# Patient Record
Sex: Male | Born: 1935 | Race: White | Hispanic: No | Marital: Married | Smoking: Never smoker
Health system: Southern US, Community
[De-identification: ages and names within clinical notes are randomized; demographics above are authoritative.]

## PROBLEM LIST (undated history)

## (undated) DIAGNOSIS — I10 Essential (primary) hypertension: Secondary | ICD-10-CM

## (undated) DIAGNOSIS — R7303 Prediabetes: Secondary | ICD-10-CM

## (undated) DIAGNOSIS — E559 Vitamin D deficiency, unspecified: Secondary | ICD-10-CM

## (undated) DIAGNOSIS — C801 Malignant (primary) neoplasm, unspecified: Secondary | ICD-10-CM

## (undated) DIAGNOSIS — E785 Hyperlipidemia, unspecified: Secondary | ICD-10-CM

## (undated) HISTORY — DX: Prediabetes: R73.03

## (undated) HISTORY — DX: Essential (primary) hypertension: I10

## (undated) HISTORY — DX: Hyperlipidemia, unspecified: E78.5

## (undated) HISTORY — DX: Malignant (primary) neoplasm, unspecified: C80.1

## (undated) HISTORY — DX: Vitamin D deficiency, unspecified: E55.9

---

## 1999-09-04 HISTORY — PX: PROSTATECTOMY: SHX69

## 2000-06-24 ENCOUNTER — Ambulatory Visit (HOSPITAL_COMMUNITY): Admission: RE | Admit: 2000-06-24 | Discharge: 2000-06-24 | Payer: Self-pay | Admitting: Internal Medicine

## 2000-06-24 ENCOUNTER — Encounter: Payer: Self-pay | Admitting: Internal Medicine

## 2000-08-05 ENCOUNTER — Encounter (INDEPENDENT_AMBULATORY_CARE_PROVIDER_SITE_OTHER): Payer: Self-pay | Admitting: Specialist

## 2000-08-05 ENCOUNTER — Other Ambulatory Visit: Admission: RE | Admit: 2000-08-05 | Discharge: 2000-08-05 | Payer: Self-pay | Admitting: Urology

## 2000-08-13 ENCOUNTER — Encounter: Admission: RE | Admit: 2000-08-13 | Discharge: 2000-08-13 | Payer: Self-pay | Admitting: Urology

## 2000-08-13 ENCOUNTER — Encounter: Payer: Self-pay | Admitting: Urology

## 2000-09-09 ENCOUNTER — Encounter (INDEPENDENT_AMBULATORY_CARE_PROVIDER_SITE_OTHER): Payer: Self-pay | Admitting: Specialist

## 2000-09-09 ENCOUNTER — Inpatient Hospital Stay (HOSPITAL_COMMUNITY): Admission: RE | Admit: 2000-09-09 | Discharge: 2000-09-12 | Payer: Self-pay | Admitting: Urology

## 2000-09-11 ENCOUNTER — Encounter: Payer: Self-pay | Admitting: Urology

## 2000-09-30 ENCOUNTER — Emergency Department (HOSPITAL_COMMUNITY): Admission: EM | Admit: 2000-09-30 | Discharge: 2000-09-30 | Payer: Self-pay | Admitting: Emergency Medicine

## 2000-10-11 ENCOUNTER — Emergency Department (HOSPITAL_COMMUNITY): Admission: EM | Admit: 2000-10-11 | Discharge: 2000-10-12 | Payer: Self-pay | Admitting: Emergency Medicine

## 2000-10-19 ENCOUNTER — Emergency Department (HOSPITAL_COMMUNITY): Admission: EM | Admit: 2000-10-19 | Discharge: 2000-10-19 | Payer: Self-pay | Admitting: Emergency Medicine

## 2003-09-22 ENCOUNTER — Ambulatory Visit (HOSPITAL_COMMUNITY): Admission: RE | Admit: 2003-09-22 | Discharge: 2003-09-22 | Payer: Self-pay | Admitting: Internal Medicine

## 2005-12-02 LAB — HM COLONOSCOPY

## 2005-12-17 ENCOUNTER — Ambulatory Visit: Payer: Self-pay | Admitting: Gastroenterology

## 2006-01-08 ENCOUNTER — Ambulatory Visit: Payer: Self-pay | Admitting: Gastroenterology

## 2007-04-04 ENCOUNTER — Ambulatory Visit (HOSPITAL_COMMUNITY): Admission: RE | Admit: 2007-04-04 | Discharge: 2007-04-04 | Payer: Self-pay | Admitting: Internal Medicine

## 2011-01-19 NOTE — Op Note (Signed)
[name]  Patient:    Timothy Vaughn, Timothy Vaughn                    MRN: [id number] Proc. Date: 09/09/00 Adm. Date:  [id number] Attending:  Laqueta Jean CC:         Marinus Maw, M.D.   Operative Report  PREOPERATIVE DIAGNOSES:  Adenocarcinoma of the prostate and right inguinal hernia.  POSTOPERATIVE DIAGNOSES:  Adenocarcinoma of the prostate and right inguinal hernia.  OPERATION PERFORMED:  Radical retropubic prostatectomy. The patient also underwent right inguinal hernia repair by Dr. Orson Slick with first assistant by Dr. Patsi Sears.  FIRST ASSISTANT:  Dr. Isabel Caprice.  PREPARATION:  After appropriate preanesthesia, the patient was brought to the operating room and placed on the operating table in dorsal supine position where general endotracheal anesthesia was introduced. TED hose were applied, a Foley catheter was placed after prepping and draping in the usual fashion. After drainage of the bladder with the patient prepped and draped, a roll was placed under the low back, and the table placed in slight suction position. Upon this, draping was accomplished in a standard fashion, using a Betadine impregnated Vidrape, to cover the right inguinal area for future hernia surgery.  A 20 cm midline incision was made with subcutaneous tissue dissected with the electrosurgical unit. The midline was incised with blunt and sharp dissection. Both right and left pelvic gutters were dissected. The self retaining retractor were placed and right and left pelvic lymph node dissection was accomplished, using the external iliac vein, as a guide. The tissue was dissected to the lateral side wall and deep to the obturator nerve, and distalwards to the inguinal canal. The tissue was sent for permanent section analysis on either side. A 4 x 4 sponge was then placed in the wound and was removed prior to the end of the case. The patient then underwent nerve  sparing radical retropubic prostatectomy, by incising the retropubic space bilaterally with sharp dissection, up to the level of the puboprostatic. After both sides were dissected, both puboprostatics were sharply cut. A bladder neck suture was then placed, with #0 Vicryl suture.  Using a Hoenfeldner clamp, two #1 Vicryl sutures were placed above the urethra, to ligate the venous ______. A separate #0 Vicryl suture ligature was doubly placed distal to these to afford better suture ligature. Upon this, the Hoenfeldner was again placed, and the venous plexus was incised. The urethra was then incised. Following complete urethral incision, the retropubic space was dissected, with a good plane above the rectum. Sharp dissection and blunt dissection was accomplished, but no electrocautery was used because ______ type of prostatectomy. The silk ligature was then used to ligate the vascular pedicle to the prostate on the right and left side. Following this, the bladder neck was dissected, and care was taken to keep as much as bladder neck as possible. Once the bladder neck was incised, it was decided that it was the correct size, without needing either closure or widening. This was everted with 4-0 Vicryl suture. The seminal vesicles were then dissected. The vas was identified and clipped with a ligaclip bilaterally. Additional portion of the left seminal vesicle was removed with the ligaclip. Following this, minimal bleeding was noted. The wound was irrigated and packed. Following external mucosal suturing of the bladder neck, five separate sutures were then independently placed at the level of the bladder neck using a size 24 dilator in the urethra.  Each of these sutures  was then attached to the bladder neck in standard fashion. Following this, water tight anastomosis was accomplished, with irrigation of bladder revealing no evidence of leakage. A left pelvic drain was placed, sutured in place  with 3-0 nylon suture.  Dr. Marcy Panning then accomplished right inguinal hernia repair. Following this repair, the wound was closed in a running fashion with #1 PDS suture. The skin was closed with a skin stapler. The patient was awakened, and taken to the recovery room in excellent condition. DD:  09/09/00 TD:  09/09/00 Job: [id number]

## 2011-01-19 NOTE — H&P (Signed)
[facility]  Patient:    Timothy Vaughn, Timothy Vaughn                    MRN: [id number] Adm. Date:  [id number] Attending:  Laqueta Jean CC:         Marinus Maw, M.D.   History and Physical  HISTORY OF PRESENT ILLNESS:  The patient is a 77 year old, married, white male, who was found to an elevated PSA with prostate biopsy showing Gleason 6 adenocarcinoma of the prostate.  He has an international prostate score of 7/7.  He has selected radical prostatectomy as the treatment of choice, nerve-sparing type.  PAST MEDICAL HISTORY:  Significant for:  1. A right inguinal hernia to be repaired at the same time.  2. Polio as a child.  (The patient is able to play tennis and golf.)  3. Thoracolumbar scoliosis (left posterior chest lump). 4. Possible LVH by EKG in the office.  PAST SURGICAL HISTORY:  Noncontributory.  TOBACCO AND ALCOHOL:  Noncontributory.  FAMILY HISTORY:  Noncontributory.  ADMISSION PHYSICAL EXAMINATION:  A well-developed, well-nourished, white male in no acute distress.  VITAL SIGNS:  Temperature 97.8 degrees, heart rate 68, respiratory rate 16, blood pressure 110/60.  HEENT:  PERRL.  EOM full.  NECK:  Supple, nontender.  No nodes.  CHEST:  Clear to P&A, but somewhat restricted to a history of polio.  EXTREMITIES:  The patient has a smaller left lower extremity than right lower extremity.  RECTAL:  Normal sphincter tone.  Prostate 3+, lobular, and benign.  No masses. No blood.  EXTREMITIES:  Without cyanosis or edema.  NEUROLOGIC:  Physiologic.  ABDOMEN:  Soft.  Positive bowel sounds.  Without organomegaly and without masses, but there is a right prominent inguinal hernia.  It is marked preoperatively.  ADMISSION DIAGNOSES: 1. Prostate cancer. 2. Right inguinal hernia for repair today. DD:  09/09/00 TD:  09/09/00 Job: [id number]

## 2011-01-19 NOTE — Consult Note (Signed)
[facility]  Patient:    Timothy Vaughn, Timothy Vaughn                    MRN: [id number] Adm. Date:  [id number] Disc. Date: [id number] Attending:  Lorre Nick CC:         Sigmund I. Patsi Sears, M.D.   Consultation Report  HISTORY OF PRESENT ILLNESS:  Timothy Vaughn is status post radical prostatectomy on September 09, 2000.  After catheter removal, he has had four episodes of postoperative retention requiring catheter replacement.  His catheter was most recently removed on Wednesday of this week.  He was on Flomax and Urecholine to aid voiding.  He did well until about 5 p.m. on February 8, when he developed retention.  He came to the emergency room where attempt was made by EMT and nurse to place a catheter without success, but apparently with some traumas.  There is penile bleeding.  I then placed a catheter with some difficulty using a catheter guide and a 16 French Foley after the routine prep and lubricating the urethra with lidocaine jelly.  The catheter irrigated easily after placement.  The balloon filled without resistance.  The catheter was placed to straight drainage.  The patient was sent home with a Vicodin prepack and he has Bactrim DS.  He was encouraged to remain on his Flomax, but will hold his Urecholine for now.  IMPRESSION:  Post radical prostatectomy with retention.  PROCEDURE:  Complicated catheterization.  PLAN:  Follow up with Dr. Patsi Sears as planned on Tuesday. DD:  10/12/00 TD:  10/13/00 Job: [id number]

## 2011-01-19 NOTE — Discharge Summary (Signed)
[facility]  Patient:    Timothy Vaughn, Timothy Vaughn                    MRN: [id number] Adm. Date:  [id number] Disc. Date: [id number] Attending:  Laqueta Vaughn CC:         Timothy Vaughn, M.D.   Discharge Summary  FINAL DIAGNOSES:  Adenocarcinoma of the prostate.  OPERATION PERFORMED:  Radical retropubic prostatectomy, September 09, 2000.  HISTORY OF PRESENT ILLNESS:  [name] is a 77 year old married white male, found to have elevated PSA with prostate biopsy showing Gleason 6 adenocarcinoma of the prostate. He an International Prostate Symptom Score sheet of 7/7, and selected radical prostatectomy  as the treatment of choice, with nerve sparing type if possible.  PAST MEDICAL HISTORY: 1. Right inguinal hernia to be repaired simultaneously. 2. Childhood polio (patient is able to play golf and tennis). 3. Thoracolumbar scoliosis (left posterior chest lump). 4. LVH by EKG.  ALCOHOL:  None.  TOBACCO:  None.  FAMILY HISTORY:  Noncontributory.  ADMISSION PHYSICAL EXAMINATION:  GENERAL:  Well-developed, well-nourished, white male in no acute distress.  VITAL SIGNS:  Blood pressure 110/60, temperature 97.8, heart rate 68, respiratory rate 16.  The remaining physical examination is as noted in the H&P of September 09, 2000.  ADMISSION LABORATORY DATA:  Shows a hemoglobin of 14.9, hematocrit of 41.7, BUN 20, creatinine 0.9.  Admission chest x-ray shows severe scoliosis with deformity of the thoracic cage.  HOSPITAL COURSE:  On the day of admission, the patient underwent radical retropubic prostatectomy. Postoperatively the patient did well and had minimum Jackson-Pratt drainage, which was removed on the day of discharge. Pathologic evaluation shows a stage [id number], with Gleason score 3+3 (6), with focal margin involvement, in left lateral prostatic capsule. There was no extracapsular extension of carcinoma noted. The entire prostate was  involved by an estimated 20% tumor.  The patient did well during this hospitalization, but developed a cough with no evidence of pneumonia. He has been treated several times in the past by Dr. Oneta Rack with Z pack and steroid dosepak, and I will allow him to be discharged on this medication. He will return to the office in one week for staple removal, and in 2-3 weeks for catheter removal. He is discharged in stable condition. DD:  09/12/00 TD:  09/12/00 Job: [id number]

## 2011-02-10 ENCOUNTER — Encounter: Payer: Self-pay | Admitting: Gastroenterology

## 2011-09-11 DIAGNOSIS — L578 Other skin changes due to chronic exposure to nonionizing radiation: Secondary | ICD-10-CM | POA: Diagnosis not present

## 2011-09-11 DIAGNOSIS — L570 Actinic keratosis: Secondary | ICD-10-CM | POA: Diagnosis not present

## 2011-11-01 DIAGNOSIS — E782 Mixed hyperlipidemia: Secondary | ICD-10-CM | POA: Diagnosis not present

## 2011-11-01 DIAGNOSIS — I10 Essential (primary) hypertension: Secondary | ICD-10-CM | POA: Diagnosis not present

## 2011-11-14 DIAGNOSIS — J209 Acute bronchitis, unspecified: Secondary | ICD-10-CM | POA: Diagnosis not present

## 2011-12-31 DIAGNOSIS — R0602 Shortness of breath: Secondary | ICD-10-CM | POA: Diagnosis not present

## 2011-12-31 DIAGNOSIS — R609 Edema, unspecified: Secondary | ICD-10-CM | POA: Diagnosis not present

## 2011-12-31 DIAGNOSIS — R7309 Other abnormal glucose: Secondary | ICD-10-CM | POA: Diagnosis not present

## 2011-12-31 DIAGNOSIS — Z79899 Other long term (current) drug therapy: Secondary | ICD-10-CM | POA: Diagnosis not present

## 2012-01-01 ENCOUNTER — Other Ambulatory Visit (HOSPITAL_COMMUNITY): Payer: Self-pay | Admitting: Internal Medicine

## 2012-01-01 ENCOUNTER — Ambulatory Visit (HOSPITAL_COMMUNITY)
Admission: RE | Admit: 2012-01-01 | Discharge: 2012-01-01 | Disposition: A | Discharge status: Home / Self Care | Payer: Medicare Other | Source: Ambulatory Visit | Attending: Internal Medicine | Admitting: Internal Medicine

## 2012-01-01 DIAGNOSIS — R0609 Other forms of dyspnea: Secondary | ICD-10-CM | POA: Diagnosis not present

## 2012-01-01 DIAGNOSIS — R609 Edema, unspecified: Secondary | ICD-10-CM | POA: Insufficient documentation

## 2012-01-01 DIAGNOSIS — M7989 Other specified soft tissue disorders: Secondary | ICD-10-CM | POA: Diagnosis not present

## 2012-01-01 DIAGNOSIS — R0600 Dyspnea, unspecified: Secondary | ICD-10-CM

## 2012-01-01 DIAGNOSIS — R0989 Other specified symptoms and signs involving the circulatory and respiratory systems: Secondary | ICD-10-CM | POA: Insufficient documentation

## 2012-01-09 DIAGNOSIS — R609 Edema, unspecified: Secondary | ICD-10-CM | POA: Diagnosis not present

## 2012-01-09 DIAGNOSIS — I10 Essential (primary) hypertension: Secondary | ICD-10-CM | POA: Diagnosis not present

## 2012-01-14 DIAGNOSIS — D1801 Hemangioma of skin and subcutaneous tissue: Secondary | ICD-10-CM | POA: Diagnosis not present

## 2012-01-14 DIAGNOSIS — L570 Actinic keratosis: Secondary | ICD-10-CM | POA: Diagnosis not present

## 2012-01-14 DIAGNOSIS — L821 Other seborrheic keratosis: Secondary | ICD-10-CM | POA: Diagnosis not present

## 2012-01-14 DIAGNOSIS — L578 Other skin changes due to chronic exposure to nonionizing radiation: Secondary | ICD-10-CM | POA: Diagnosis not present

## 2012-02-05 DIAGNOSIS — C61 Malignant neoplasm of prostate: Secondary | ICD-10-CM | POA: Diagnosis not present

## 2012-02-05 DIAGNOSIS — E782 Mixed hyperlipidemia: Secondary | ICD-10-CM | POA: Diagnosis not present

## 2012-02-05 DIAGNOSIS — Z79899 Other long term (current) drug therapy: Secondary | ICD-10-CM | POA: Diagnosis not present

## 2012-02-05 DIAGNOSIS — R7309 Other abnormal glucose: Secondary | ICD-10-CM | POA: Diagnosis not present

## 2012-02-05 DIAGNOSIS — Z23 Encounter for immunization: Secondary | ICD-10-CM | POA: Diagnosis not present

## 2012-02-05 DIAGNOSIS — I10 Essential (primary) hypertension: Secondary | ICD-10-CM | POA: Diagnosis not present

## 2012-02-05 DIAGNOSIS — E559 Vitamin D deficiency, unspecified: Secondary | ICD-10-CM | POA: Diagnosis not present

## 2012-02-05 DIAGNOSIS — Z1212 Encounter for screening for malignant neoplasm of rectum: Secondary | ICD-10-CM | POA: Diagnosis not present

## 2012-02-05 DIAGNOSIS — Z111 Encounter for screening for respiratory tuberculosis: Secondary | ICD-10-CM | POA: Diagnosis not present

## 2012-02-13 DIAGNOSIS — L02419 Cutaneous abscess of limb, unspecified: Secondary | ICD-10-CM | POA: Diagnosis not present

## 2012-05-20 DIAGNOSIS — E559 Vitamin D deficiency, unspecified: Secondary | ICD-10-CM | POA: Diagnosis not present

## 2012-05-20 DIAGNOSIS — I10 Essential (primary) hypertension: Secondary | ICD-10-CM | POA: Diagnosis not present

## 2012-05-20 DIAGNOSIS — Z79899 Other long term (current) drug therapy: Secondary | ICD-10-CM | POA: Diagnosis not present

## 2012-05-20 DIAGNOSIS — E782 Mixed hyperlipidemia: Secondary | ICD-10-CM | POA: Diagnosis not present

## 2012-05-20 DIAGNOSIS — R7309 Other abnormal glucose: Secondary | ICD-10-CM | POA: Diagnosis not present

## 2012-05-22 ENCOUNTER — Encounter: Payer: Self-pay | Admitting: Gastroenterology

## 2012-07-04 DIAGNOSIS — L819 Disorder of pigmentation, unspecified: Secondary | ICD-10-CM | POA: Diagnosis not present

## 2012-07-04 DIAGNOSIS — D485 Neoplasm of uncertain behavior of skin: Secondary | ICD-10-CM | POA: Diagnosis not present

## 2012-07-04 DIAGNOSIS — C44611 Basal cell carcinoma of skin of unspecified upper limb, including shoulder: Secondary | ICD-10-CM | POA: Diagnosis not present

## 2012-07-04 DIAGNOSIS — L578 Other skin changes due to chronic exposure to nonionizing radiation: Secondary | ICD-10-CM | POA: Diagnosis not present

## 2012-07-04 DIAGNOSIS — L821 Other seborrheic keratosis: Secondary | ICD-10-CM | POA: Diagnosis not present

## 2012-07-04 DIAGNOSIS — C44519 Basal cell carcinoma of skin of other part of trunk: Secondary | ICD-10-CM | POA: Diagnosis not present

## 2012-07-04 DIAGNOSIS — D1801 Hemangioma of skin and subcutaneous tissue: Secondary | ICD-10-CM | POA: Diagnosis not present

## 2012-07-04 DIAGNOSIS — L570 Actinic keratosis: Secondary | ICD-10-CM | POA: Diagnosis not present

## 2012-08-14 DIAGNOSIS — C4491 Basal cell carcinoma of skin, unspecified: Secondary | ICD-10-CM | POA: Diagnosis not present

## 2012-08-20 DIAGNOSIS — E559 Vitamin D deficiency, unspecified: Secondary | ICD-10-CM | POA: Diagnosis not present

## 2012-08-20 DIAGNOSIS — I10 Essential (primary) hypertension: Secondary | ICD-10-CM | POA: Diagnosis not present

## 2012-08-20 DIAGNOSIS — Z79899 Other long term (current) drug therapy: Secondary | ICD-10-CM | POA: Diagnosis not present

## 2012-08-20 DIAGNOSIS — E782 Mixed hyperlipidemia: Secondary | ICD-10-CM | POA: Diagnosis not present

## 2012-08-20 DIAGNOSIS — R7309 Other abnormal glucose: Secondary | ICD-10-CM | POA: Diagnosis not present

## 2012-11-03 DIAGNOSIS — L821 Other seborrheic keratosis: Secondary | ICD-10-CM | POA: Diagnosis not present

## 2012-11-03 DIAGNOSIS — C4441 Basal cell carcinoma of skin of scalp and neck: Secondary | ICD-10-CM | POA: Diagnosis not present

## 2012-11-03 DIAGNOSIS — Z85828 Personal history of other malignant neoplasm of skin: Secondary | ICD-10-CM | POA: Diagnosis not present

## 2012-11-03 DIAGNOSIS — L570 Actinic keratosis: Secondary | ICD-10-CM | POA: Diagnosis not present

## 2012-11-03 DIAGNOSIS — D485 Neoplasm of uncertain behavior of skin: Secondary | ICD-10-CM | POA: Diagnosis not present

## 2012-11-03 DIAGNOSIS — L578 Other skin changes due to chronic exposure to nonionizing radiation: Secondary | ICD-10-CM | POA: Diagnosis not present

## 2012-11-03 DIAGNOSIS — D1801 Hemangioma of skin and subcutaneous tissue: Secondary | ICD-10-CM | POA: Diagnosis not present

## 2012-11-12 DIAGNOSIS — D485 Neoplasm of uncertain behavior of skin: Secondary | ICD-10-CM | POA: Diagnosis not present

## 2012-11-12 DIAGNOSIS — C4441 Basal cell carcinoma of skin of scalp and neck: Secondary | ICD-10-CM | POA: Diagnosis not present

## 2012-11-12 DIAGNOSIS — Z85828 Personal history of other malignant neoplasm of skin: Secondary | ICD-10-CM | POA: Diagnosis not present

## 2012-11-12 DIAGNOSIS — L578 Other skin changes due to chronic exposure to nonionizing radiation: Secondary | ICD-10-CM | POA: Diagnosis not present

## 2012-11-12 DIAGNOSIS — D1801 Hemangioma of skin and subcutaneous tissue: Secondary | ICD-10-CM | POA: Diagnosis not present

## 2012-11-12 DIAGNOSIS — L570 Actinic keratosis: Secondary | ICD-10-CM | POA: Diagnosis not present

## 2012-11-12 DIAGNOSIS — L821 Other seborrheic keratosis: Secondary | ICD-10-CM | POA: Diagnosis not present

## 2012-11-21 DIAGNOSIS — Z79899 Other long term (current) drug therapy: Secondary | ICD-10-CM | POA: Diagnosis not present

## 2012-11-21 DIAGNOSIS — I10 Essential (primary) hypertension: Secondary | ICD-10-CM | POA: Diagnosis not present

## 2012-11-21 DIAGNOSIS — E782 Mixed hyperlipidemia: Secondary | ICD-10-CM | POA: Diagnosis not present

## 2012-11-21 DIAGNOSIS — E559 Vitamin D deficiency, unspecified: Secondary | ICD-10-CM | POA: Diagnosis not present

## 2012-11-21 DIAGNOSIS — R7309 Other abnormal glucose: Secondary | ICD-10-CM | POA: Diagnosis not present

## 2012-12-02 DIAGNOSIS — C4491 Basal cell carcinoma of skin, unspecified: Secondary | ICD-10-CM | POA: Diagnosis not present

## 2013-02-04 DIAGNOSIS — I10 Essential (primary) hypertension: Secondary | ICD-10-CM | POA: Diagnosis not present

## 2013-02-04 DIAGNOSIS — E782 Mixed hyperlipidemia: Secondary | ICD-10-CM | POA: Diagnosis not present

## 2013-02-04 DIAGNOSIS — Z1212 Encounter for screening for malignant neoplasm of rectum: Secondary | ICD-10-CM | POA: Diagnosis not present

## 2013-02-04 DIAGNOSIS — C61 Malignant neoplasm of prostate: Secondary | ICD-10-CM | POA: Diagnosis not present

## 2013-02-04 DIAGNOSIS — Z79899 Other long term (current) drug therapy: Secondary | ICD-10-CM | POA: Diagnosis not present

## 2013-02-04 DIAGNOSIS — R7309 Other abnormal glucose: Secondary | ICD-10-CM | POA: Diagnosis not present

## 2013-02-23 DIAGNOSIS — L578 Other skin changes due to chronic exposure to nonionizing radiation: Secondary | ICD-10-CM | POA: Diagnosis not present

## 2013-02-23 DIAGNOSIS — L820 Inflamed seborrheic keratosis: Secondary | ICD-10-CM | POA: Diagnosis not present

## 2013-02-23 DIAGNOSIS — L570 Actinic keratosis: Secondary | ICD-10-CM | POA: Diagnosis not present

## 2013-02-23 DIAGNOSIS — Z85828 Personal history of other malignant neoplasm of skin: Secondary | ICD-10-CM | POA: Diagnosis not present

## 2013-05-20 ENCOUNTER — Other Ambulatory Visit (HOSPITAL_COMMUNITY): Payer: Self-pay | Admitting: Internal Medicine

## 2013-05-20 ENCOUNTER — Ambulatory Visit (HOSPITAL_COMMUNITY)
Admission: RE | Admit: 2013-05-20 | Discharge: 2013-05-20 | Disposition: A | Discharge status: Home / Self Care | Payer: Medicare Other | Source: Ambulatory Visit | Attending: Internal Medicine | Admitting: Internal Medicine

## 2013-05-20 DIAGNOSIS — J984 Other disorders of lung: Secondary | ICD-10-CM | POA: Diagnosis not present

## 2013-05-20 DIAGNOSIS — Z79899 Other long term (current) drug therapy: Secondary | ICD-10-CM | POA: Diagnosis not present

## 2013-05-20 DIAGNOSIS — E559 Vitamin D deficiency, unspecified: Secondary | ICD-10-CM | POA: Diagnosis not present

## 2013-05-20 DIAGNOSIS — R062 Wheezing: Secondary | ICD-10-CM

## 2013-05-20 DIAGNOSIS — E782 Mixed hyperlipidemia: Secondary | ICD-10-CM | POA: Diagnosis not present

## 2013-05-20 DIAGNOSIS — M4120 Other idiopathic scoliosis, site unspecified: Secondary | ICD-10-CM | POA: Insufficient documentation

## 2013-05-20 DIAGNOSIS — I10 Essential (primary) hypertension: Secondary | ICD-10-CM

## 2013-05-20 DIAGNOSIS — R7309 Other abnormal glucose: Secondary | ICD-10-CM | POA: Diagnosis not present

## 2013-05-20 DIAGNOSIS — Z23 Encounter for immunization: Secondary | ICD-10-CM | POA: Diagnosis not present

## 2013-05-20 DIAGNOSIS — J189 Pneumonia, unspecified organism: Secondary | ICD-10-CM

## 2013-08-10 DIAGNOSIS — L821 Other seborrheic keratosis: Secondary | ICD-10-CM | POA: Diagnosis not present

## 2013-08-10 DIAGNOSIS — L570 Actinic keratosis: Secondary | ICD-10-CM | POA: Diagnosis not present

## 2013-08-10 DIAGNOSIS — C4441 Basal cell carcinoma of skin of scalp and neck: Secondary | ICD-10-CM | POA: Diagnosis not present

## 2013-08-10 DIAGNOSIS — Z85828 Personal history of other malignant neoplasm of skin: Secondary | ICD-10-CM | POA: Diagnosis not present

## 2013-08-10 DIAGNOSIS — C44211 Basal cell carcinoma of skin of unspecified ear and external auricular canal: Secondary | ICD-10-CM | POA: Diagnosis not present

## 2013-08-12 ENCOUNTER — Encounter: Payer: Self-pay | Admitting: Internal Medicine

## 2013-08-14 ENCOUNTER — Ambulatory Visit (INDEPENDENT_AMBULATORY_CARE_PROVIDER_SITE_OTHER): Payer: Medicare Other | Admitting: Internal Medicine

## 2013-08-14 ENCOUNTER — Encounter: Payer: Self-pay | Admitting: Internal Medicine

## 2013-08-14 VITALS — BP 130/68 | HR 56 | Temp 98.4°F | Resp 16 | Wt 148.2 lb

## 2013-08-14 DIAGNOSIS — J984 Other disorders of lung: Secondary | ICD-10-CM

## 2013-08-14 DIAGNOSIS — Z125 Encounter for screening for malignant neoplasm of prostate: Secondary | ICD-10-CM

## 2013-08-14 DIAGNOSIS — R7309 Other abnormal glucose: Secondary | ICD-10-CM

## 2013-08-14 DIAGNOSIS — Z79899 Other long term (current) drug therapy: Secondary | ICD-10-CM

## 2013-08-14 DIAGNOSIS — E782 Mixed hyperlipidemia: Secondary | ICD-10-CM | POA: Diagnosis not present

## 2013-08-14 DIAGNOSIS — M4120 Other idiopathic scoliosis, site unspecified: Secondary | ICD-10-CM

## 2013-08-14 DIAGNOSIS — I10 Essential (primary) hypertension: Secondary | ICD-10-CM | POA: Diagnosis not present

## 2013-08-14 DIAGNOSIS — E559 Vitamin D deficiency, unspecified: Secondary | ICD-10-CM

## 2013-08-14 DIAGNOSIS — Z1212 Encounter for screening for malignant neoplasm of rectum: Secondary | ICD-10-CM

## 2013-08-14 LAB — HEMOGLOBIN A1C
Hgb A1c MFr Bld: 5.8 % — ABNORMAL HIGH (ref <5.7)
Mean Plasma Glucose: 120 mg/dL — ABNORMAL HIGH (ref <117)

## 2013-08-14 LAB — CBC WITH DIFFERENTIAL/PLATELET
Basophils Absolute: 0 10*3/uL (ref 0.0–0.1)
Eosinophils Absolute: 0.1 10*3/uL (ref 0.0–0.7)
Eosinophils Relative: 1 % (ref 0–5)
HCT: 45.5 % (ref 39.0–52.0)
Hemoglobin: 15.6 g/dL (ref 13.0–17.0)
Lymphocytes Relative: 28 % (ref 12–46)
MCV: 96.4 fL (ref 78.0–100.0)
Monocytes Absolute: 0.6 10*3/uL (ref 0.1–1.0)
Monocytes Relative: 10 % (ref 3–12)
Neutrophils Relative %: 61 % (ref 43–77)
RDW: 13.3 % (ref 11.5–15.5)
WBC: 6.1 10*3/uL (ref 4.0–10.5)

## 2013-08-14 LAB — LIPID PANEL
HDL: 58 mg/dL (ref >39)
LDL Cholesterol: 109 mg/dL — ABNORMAL HIGH (ref 0–99)

## 2013-08-14 LAB — BASIC METABOLIC PANEL WITH GFR
BUN: 26 mg/dL — ABNORMAL HIGH (ref 6–23)
CO2: 35 mEq/L — ABNORMAL HIGH (ref 19–32)
Chloride: 99 mEq/L (ref 96–112)
Creat: 0.9 mg/dL (ref 0.50–1.35)
GFR, Est Non African American: 80 mL/min
Glucose, Bld: 79 mg/dL (ref 70–99)

## 2013-08-14 LAB — TSH: TSH: 0.887 u[IU]/mL (ref 0.350–4.500)

## 2013-08-14 LAB — HEPATIC FUNCTION PANEL
ALT: 22 U/L (ref 0–53)
AST: 24 U/L (ref 0–37)
Albumin: 4.4 g/dL (ref 3.5–5.2)
Alkaline Phosphatase: 53 U/L (ref 39–117)
Indirect Bilirubin: 0.6 mg/dL (ref 0.0–0.9)
Total Protein: 6.7 g/dL (ref 6.0–8.3)

## 2013-08-14 NOTE — Progress Notes (Signed)
Patient ID: Timothy Vaughn, male   DOB: Jul 06, 1929, 79 y.o.   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    BP has been controlled at home. Today's BP is 130/68. Patient denies any cardiac type chest pain, palpitations, dyspnea/orthopnea/PND, dizziness, claudication, or dependent edema.   Hyperlipidemia is controlled with diet & meds. Last Cholesterol was 165, Triglycerides were 115, HDL 53, and LDL 89. Patient denies myalgias or other med SE's.    Regarding patient's rfestrictive lung disease , he has done fairly weel over the las year and using his HHN/Duoneb very sporatically when he feels he is becoming more congested. This last year he has done very well with no recent respiratory infections.   Also, the patient has history of PreDiabetes with last A1c of 6.0% in September. Patient denies any symptoms of reactive hypoglycemia, diabetic polys, paresthesias or visual blurring.   Further, Patient has history of Vitamin D Deficiency with last vitamin D of 92 in September. Patient supplements vitamin D without any suspected side-effects.  Current Outpatient Prescriptions on File Prior to Visit  Medication Sig Dispense Refill  . Ascorbic Acid (VITAMIN C) 100 MG tablet Take 100 mg by mouth daily.      Marland Kitchen aspirin 81 MG tablet Take 81 mg by mouth daily.      . furosemide (LASIX) 40 MG tablet Take 40 mg by mouth.      . Multiple Vitamin (MULTI VITAMIN DAILY PO) Take by mouth.      . Omega-3 Fatty Acids (FISH OIL PO) Take by mouth.      . pravastatin (PRAVACHOL) 40 MG tablet Take 40 mg by mouth daily.          Allergies  Allergen Reactions  . Lipitor [Atorvastatin] Other (See Comments)    Severe NIV  . Niaspan [Niacin Er] Itching    PMHx:   Past Medical History  Diagnosis Date  . Hypertension   . Hyperlipidemia   .  Pre-diabetes   . Vitamin D deficiency   . Cancer     prostate    FHx:    Reviewed / unchanged  SHx:    Reviewed / unchanged  Systems Review: Constitutional: Denies fever, chills, wt changes, headaches, insomnia, fatigue, night sweats, change in appetite. Eyes: Denies redness, blurred vision, diplopia, discharge, itchy, watery eyes.  ENT: Denies discharge, congestion, post nasal drip, epistaxis, sore throat, earache, hearing loss, dental pain, tinnitus, vertigo, sinus pain, snoring.  CV: Denies chest pain, palpitations, irregular heartbeat, syncope, dyspnea, diaphoresis, orthopnea, PND, claudication, edema. Respiratory: denies cough, dyspnea, DOE, pleurisy, hoarseness, laryngitis, wheezing.  Gastrointestinal: Denies dysphagia, odynophagia, heartburn, reflux, water brash, abdominal pain or cramps, nausea, vomiting, bloating, diarrhea, constipation, hematemesis, melena, hematochezia,  or hemorrhoids. Genitourinary: Denies dysuria, frequency, urgency, nocturia, hesitancy, discharge, hematuria, flank pain. Musculoskeletal: Denies arthralgias, myalgias, stiffness, jt. swelling, pain, limp, strain/sprain.  Skin: Denies pruritus, rash, hives, warts, acne, eczema, change in skin lesion(s). Neuro: No weakness, tremor, incoordination, spasms, paresthesia, or pain. Psychiatric: Denies confusion, memory loss, or sensory loss. Endo: Denies change in weight, skin, hair change.  Heme/Lymph: No excessive bleeding, bruising, orenlarged lymph nodes.  Filed Vitals:   08/14/13 1039  BP: 130/68  Pulse: 56  Temp: 98.4 F (36.9 C)  Resp: 16    On Exam:  Appears well nourished - in no distress. Eyes: PERRLA, EOMs, conjunctiva  no swelling or erythema. Sinuses: No frontal/maxillary tenderness ENT/Mouth: EAC's clear, TM's nl w/o erythema, bulging. Nares clear w/o erythema, swelling, exudates. Oropharynx clear without erythema or exudates. Oral hygiene is good. Tongue normal, non obstructing. Hearing  intact.  Neck: Supple. Thyroid nl. Car 2+/2+ without bruits, nodes or JVD. Chest: Severe chest deformity with roto-kyphoscoliosis. BS very distant and clear w/o rales, rhonchi, wheezing or stridor.  Cor: Heart sounds normal w/ regular rate and rhythm without sig. murmurs, gallops, clicks, or rubs. Peripheral pulses normal and equal  without edema.  Abdomen: Soft & bowel sounds normal. Non-tender w/o guarding, rebound, hernias, masses, or organomegaly.  Lymphatics: Unremarkable.  Musculoskeletal: Full ROM all peripheral extremities, joint stability, 5/5 strength, and normal gait.  Skin: Warm, dry without exposed rashes, lesions, ecchymosis apparent.  Neuro: Cranial nerves intact, reflexes equal bilaterally. Sensory-motor testing grossly intact. Tendon reflexes grossly intact.  Pysch: Alert & oriented x 3. Insight and judgement nl & appropriate. No ideations.  Assessment and Plan:  1. Hypertension - Continue monitor blood pressure at home. Continue diet/meds same.  2. Hyperlipidemia - Continue diet/meds, exercise,& lifestyle modifications. Continue monitor periodic cholesterol/liver & renal functions   3. Pre-diabetes - Continue diet, exercise, lifestyle modifications. Monitor appropriate labs.  4. Vitamin D Deficiency - Continue supplementation  5. Restrictive Lung Disease  Recommended regular exercise, BP monitoring, weight control, and discussed med and SE's. Recommended labs to assess and monitor clinical status. Further disposition pending results of labs.

## 2013-08-14 NOTE — Patient Instructions (Signed)

## 2013-08-15 LAB — INSULIN, FASTING: Insulin fasting, serum: 11 u[IU]/mL (ref 3–28)

## 2013-08-17 ENCOUNTER — Telehealth: Payer: Self-pay

## 2013-08-17 NOTE — Telephone Encounter (Signed)
No answer no machine

## 2013-08-17 NOTE — Telephone Encounter (Signed)
Message copied by Joya Martyr on Mon Aug 17, 2013 10:30 AM ------      Message from: Lucky Cowboy      Created: Sun Aug 16, 2013 10:45 PM       Chol 185 - HDL 58 - excellent - keep up great work             A1c 5.8% borderline early diabetic - avoid sweets/candy whiyte stuff - etc      All else normal and OK ------

## 2013-09-29 DIAGNOSIS — H31009 Unspecified chorioretinal scars, unspecified eye: Secondary | ICD-10-CM | POA: Diagnosis not present

## 2013-09-29 DIAGNOSIS — H1045 Other chronic allergic conjunctivitis: Secondary | ICD-10-CM | POA: Diagnosis not present

## 2013-09-29 DIAGNOSIS — H2510 Age-related nuclear cataract, unspecified eye: Secondary | ICD-10-CM | POA: Diagnosis not present

## 2013-10-12 ENCOUNTER — Ambulatory Visit (INDEPENDENT_AMBULATORY_CARE_PROVIDER_SITE_OTHER): Payer: Medicare Other | Admitting: Physician Assistant

## 2013-10-12 ENCOUNTER — Encounter: Payer: Self-pay | Admitting: Internal Medicine

## 2013-10-12 VITALS — BP 130/64 | HR 84 | Temp 98.1°F | Resp 16 | Ht 64.0 in | Wt 152.0 lb

## 2013-10-12 DIAGNOSIS — J209 Acute bronchitis, unspecified: Secondary | ICD-10-CM

## 2013-10-12 MED ORDER — PROMETHAZINE-CODEINE 6.25-10 MG/5ML PO SYRP: 5.0000 mL | ORAL_SOLUTION | Freq: Four times a day (QID) | ORAL | Status: DC | PRN

## 2013-10-12 MED ORDER — PREDNISONE 20 MG PO TABS: ORAL_TABLET | ORAL | Status: DC

## 2013-10-12 MED ORDER — AZITHROMYCIN 250 MG PO TABS
ORAL_TABLET | ORAL | Status: DC
Start: 2013-10-12 — End: 2013-11-13

## 2013-10-12 NOTE — Patient Instructions (Signed)

## 2013-10-12 NOTE — Progress Notes (Signed)
   Subjective:    Patient ID: Timothy Vaughn, male    DOB: 07-31-1929, 80 y.o.   MRN: [id number]  Cough This is a new problem. The current episode started 1 to 4 weeks ago. The cough is non-productive. Associated symptoms include headaches, nasal congestion, postnasal drip, rhinorrhea, shortness of breath and wheezing. Pertinent negatives include no chest pain, chills, ear congestion, ear pain, fever, heartburn, hemoptysis, myalgias, rash, sore throat, sweats or weight loss. The symptoms are aggravated by lying down. He has tried OTC cough suppressant for the symptoms. The treatment provided no relief. restrictive lung disease    Review of Systems  Constitutional: Negative for fever, chills and weight loss.  HENT: Positive for postnasal drip and rhinorrhea. Negative for ear pain and sore throat.   Respiratory: Positive for cough, shortness of breath and wheezing. Negative for hemoptysis and chest tightness.   Cardiovascular: Negative for chest pain.       Denies PND, edema, weight gain.   Gastrointestinal: Negative.  Negative for heartburn.  Genitourinary: Negative.   Musculoskeletal: Negative for myalgias.  Skin: Negative for rash.  Neurological: Positive for headaches.       Objective:   Physical Exam  Constitutional: He is oriented to person, place, and time. He appears well-developed and well-nourished.  HENT:  Head: Normocephalic and atraumatic.  Right Ear: External ear normal.  Left Ear: External ear normal.  Nose: Right sinus exhibits maxillary sinus tenderness. Left sinus exhibits maxillary sinus tenderness.  Mouth/Throat: Oropharynx is clear and moist.  Eyes: Conjunctivae are normal. Pupils are equal, round, and reactive to light.  Neck: Normal range of motion. Neck supple.  Cardiovascular: Normal rate, regular rhythm and normal heart sounds.   No murmur heard. Pulmonary/Chest: Effort normal. No respiratory distress. He has wheezes. He has no rales. He exhibits no  tenderness.  Abdominal: Soft. Bowel sounds are normal.  Lymphadenopathy:    He has no cervical adenopathy.  Neurological: He is alert and oriented to person, place, and time.  Skin: Skin is warm and dry.      Assessment & Plan:  Acute bronchitis - Plan: azithromycin (ZITHROMAX) 250 MG tablet, predniSONE (DELTASONE) 20 MG tablet, promethazine-codeine (PHENERGAN WITH CODEINE) 6.25-10 MG/5ML syrup  No PND, mild weight gain, no worsening edema- if not better will call office and will fluid/salt restrict. On lasix.

## 2013-11-13 ENCOUNTER — Encounter: Payer: Self-pay | Admitting: Emergency Medicine

## 2013-11-13 ENCOUNTER — Ambulatory Visit (INDEPENDENT_AMBULATORY_CARE_PROVIDER_SITE_OTHER): Payer: Medicare Other | Admitting: Emergency Medicine

## 2013-11-13 ENCOUNTER — Other Ambulatory Visit: Payer: Self-pay | Admitting: Emergency Medicine

## 2013-11-13 VITALS — BP 116/60 | HR 68 | Temp 97.8°F | Resp 18 | Ht 64.0 in | Wt 146.0 lb

## 2013-11-13 DIAGNOSIS — E782 Mixed hyperlipidemia: Secondary | ICD-10-CM

## 2013-11-13 DIAGNOSIS — R7309 Other abnormal glucose: Secondary | ICD-10-CM | POA: Diagnosis not present

## 2013-11-13 DIAGNOSIS — E559 Vitamin D deficiency, unspecified: Secondary | ICD-10-CM | POA: Diagnosis not present

## 2013-11-13 DIAGNOSIS — I10 Essential (primary) hypertension: Secondary | ICD-10-CM | POA: Diagnosis not present

## 2013-11-13 LAB — CBC WITH DIFFERENTIAL/PLATELET
Basophils Absolute: 0 10*3/uL (ref 0.0–0.1)
Basophils Relative: 0 % (ref 0–1)
EOS ABS: 0 10*3/uL (ref 0.0–0.7)
EOS PCT: 1 % (ref 0–5)
HEMATOCRIT: 43.5 % (ref 39.0–52.0)
Hemoglobin: 14.8 g/dL (ref 13.0–17.0)
LYMPHS ABS: 1.5 10*3/uL (ref 0.7–4.0)
LYMPHS PCT: 31 % (ref 12–46)
MCH: 32.4 pg (ref 26.0–34.0)
MCHC: 34 g/dL (ref 30.0–36.0)
MCV: 95.2 fL (ref 78.0–100.0)
MONO ABS: 0.5 10*3/uL (ref 0.1–1.0)
Monocytes Relative: 11 % (ref 3–12)
Neutro Abs: 2.8 10*3/uL (ref 1.7–7.7)
Neutrophils Relative %: 57 % (ref 43–77)
PLATELETS: 163 10*3/uL (ref 150–400)
RBC: 4.57 MIL/uL (ref 4.22–5.81)
RDW: 13.3 % (ref 11.5–15.5)
WBC: 4.9 10*3/uL (ref 4.0–10.5)

## 2013-11-13 LAB — HEPATIC FUNCTION PANEL
ALBUMIN: 4.1 g/dL (ref 3.5–5.2)
ALT: 17 U/L (ref 0–53)
AST: 22 U/L (ref 0–37)
Alkaline Phosphatase: 51 U/L (ref 39–117)
BILIRUBIN DIRECT: 0.1 mg/dL (ref 0.0–0.3)
BILIRUBIN TOTAL: 0.6 mg/dL (ref 0.2–1.2)
Indirect Bilirubin: 0.5 mg/dL (ref 0.2–1.2)
Total Protein: 6.1 g/dL (ref 6.0–8.3)

## 2013-11-13 LAB — HEMOGLOBIN A1C
Hgb A1c MFr Bld: 6 % — ABNORMAL HIGH (ref <5.7)
Mean Plasma Glucose: 126 mg/dL — ABNORMAL HIGH (ref <117)

## 2013-11-13 LAB — BASIC METABOLIC PANEL WITH GFR
BUN: 25 mg/dL — AB (ref 6–23)
CO2: 38 mEq/L — ABNORMAL HIGH (ref 19–32)
CREATININE: 0.88 mg/dL (ref 0.50–1.35)
Calcium: 9.1 mg/dL (ref 8.4–10.5)
Chloride: 101 mEq/L (ref 96–112)
GFR, EST NON AFRICAN AMERICAN: 80 mL/min
GLUCOSE: 97 mg/dL (ref 70–99)
Potassium: 4.8 mEq/L (ref 3.5–5.3)
Sodium: 144 mEq/L (ref 135–145)

## 2013-11-13 LAB — LIPID PANEL
CHOL/HDL RATIO: 3.3 ratio
CHOLESTEROL: 175 mg/dL (ref 0–200)
HDL: 53 mg/dL (ref >39)
LDL Cholesterol: 99 mg/dL (ref 0–99)
TRIGLYCERIDES: 117 mg/dL (ref <150)
VLDL: 23 mg/dL (ref 0–40)

## 2013-11-13 LAB — MAGNESIUM: Magnesium: 1.9 mg/dL (ref 1.5–2.5)

## 2013-11-13 NOTE — Patient Instructions (Signed)
PREDiabetes and Exercise Exercising regularly is important. It is not just about losing weight. It has many health benefits, such as:  Improving your overall fitness, flexibility, and endurance.  Increasing your bone density.  Helping with weight control.  Decreasing your body fat.  Increasing your muscle strength.  Reducing stress and tension.  Improving your overall health. People with diabetes who exercise gain additional benefits because exercise:  Reduces appetite.  Improves the body's use of blood sugar (glucose).  Helps lower or control blood glucose.  Decreases blood pressure.  Helps control blood lipids (such as cholesterol and triglycerides).  Improves the body's use of the hormone insulin by:  Increasing the body's insulin sensitivity.  Reducing the body's insulin needs.  Decreases the risk for heart disease because exercising:  Lowers cholesterol and triglycerides levels.  Increases the levels of good cholesterol (such as high-density lipoproteins [HDL]) in the body.  Lowers blood glucose levels. YOUR ACTIVITY PLAN  Choose an activity that you enjoy and set realistic goals. Your health care provider or diabetes educator can help you make an activity plan that works for you. You can break activities into 2 or 3 sessions throughout the day. Doing so is as good as one long session. Exercise ideas include:  Taking the dog for a walk.  Taking the stairs instead of the elevator.  Dancing to your favorite song.  Doing your favorite exercise with a friend. RECOMMENDATIONS FOR EXERCISING WITH TYPE 1 OR TYPE 2 DIABETES   Check your blood glucose before exercising. If blood glucose levels are greater than 240 mg/dL, check for urine ketones. Do not exercise if ketones are present.  Avoid injecting insulin into areas of the body that are going to be exercised. For example, avoid injecting insulin into:  The arms when playing tennis.  The legs when  jogging.  Keep a record of:  Food intake before and after you exercise.  Expected peak times of insulin action.  Blood glucose levels before and after you exercise.  The type and amount of exercise you have done.  Review your records with your health care provider. Your health care provider will help you to develop guidelines for adjusting food intake and insulin amounts before and after exercising.  If you take insulin or oral hypoglycemic agents, watch for signs and symptoms of hypoglycemia. They include:  Dizziness.  Shaking.  Sweating.  Chills.  Confusion.  Drink plenty of water while you exercise to prevent dehydration or heat stroke. Body water is lost during exercise and must be replaced.  Talk to your health care provider before starting an exercise program to make sure it is safe for you. Remember, almost any type of activity is better than none. Document Released: 11/10/2003 Document Revised: 04/22/2013 Document Reviewed: 01/27/2013 [employer] Patient Information 2014 [employer]. Fat and Cholesterol Control Diet Fat and cholesterol levels in your blood and organs are influenced by your diet. High levels of fat and cholesterol may lead to diseases of the heart, small and large blood vessels, gallbladder, liver, and pancreas. CONTROLLING FAT AND CHOLESTEROL WITH DIET Although exercise and lifestyle factors are important, your diet is key. That is because certain foods are known to raise cholesterol and others to lower it. The goal is to balance foods for their effect on cholesterol and more importantly, to replace saturated and trans fat with other types of fat, such as monounsaturated fat, polyunsaturated fat, and omega-3 fatty acids. On average, a person should consume no more than 15 to   17 g of saturated fat daily. Saturated and trans fats are considered "bad" fats, and they will raise LDL cholesterol. Saturated fats are primarily found in animal products such as  meats, butter, and cream. However, that does not mean you need to give up all your favorite foods. Today, there are good tasting, low-fat, low-cholesterol substitutes for most of the things you like to eat. Choose low-fat or nonfat alternatives. Choose round or loin cuts of red meat. These types of cuts are lowest in fat and cholesterol. Chicken (without the skin), fish, veal, and ground turkey breast are great choices. Eliminate fatty meats, such as hot dogs and salami. Even shellfish have little or no saturated fat. Have a 3 oz (85 g) portion when you eat lean meat, poultry, or fish. Trans fats are also called "partially hydrogenated oils." They are oils that have been scientifically manipulated so that they are solid at room temperature resulting in a longer shelf life and improved taste and texture of foods in which they are added. Trans fats are found in stick margarine, some tub margarines, cookies, crackers, and baked goods.  When baking and cooking, oils are a great substitute for butter. The monounsaturated oils are especially beneficial since it is believed they lower LDL and raise HDL. The oils you should avoid entirely are saturated tropical oils, such as coconut and palm.  Remember to eat a lot from food groups that are naturally free of saturated and trans fat, including fish, fruit, vegetables, beans, grains (barley, rice, couscous, bulgur wheat), and pasta (without cream sauces).  IDENTIFYING FOODS THAT LOWER FAT AND CHOLESTEROL  Soluble fiber may lower your cholesterol. This type of fiber is found in fruits such as apples, vegetables such as broccoli, potatoes, and carrots, legumes such as beans, peas, and lentils, and grains such as barley. Foods fortified with plant sterols (phytosterol) may also lower cholesterol. You should eat at least 2 g per day of these foods for a cholesterol lowering effect.  Read package labels to identify low-saturated fats, trans fat free, and low-fat foods at the  supermarket. Select cheeses that have only 2 to 3 g saturated fat per ounce. Use a heart-healthy tub margarine that is free of trans fats or partially hydrogenated oil. When buying baked goods (cookies, crackers), avoid partially hydrogenated oils. Breads and muffins should be made from whole grains (whole-wheat or whole oat flour, instead of "flour" or "enriched flour"). Buy non-creamy canned soups with reduced salt and no added fats.  FOOD PREPARATION TECHNIQUES  Never deep-fry. If you must fry, either stir-fry, which uses very little fat, or use non-stick cooking sprays. When possible, broil, bake, or roast meats, and steam vegetables. Instead of putting butter or margarine on vegetables, use lemon and herbs, applesauce, and cinnamon (for squash and sweet potatoes). Use nonfat yogurt, salsa, and low-fat dressings for salads.  LOW-SATURATED FAT / LOW-FAT FOOD SUBSTITUTES Meats / Saturated Fat (g)  Avoid: Steak, marbled (3 oz/85 g) / 11 g  Choose: Steak, lean (3 oz/85 g) / 4 g  Avoid: Hamburger (3 oz/85 g) / 7 g  Choose: Hamburger, lean (3 oz/85 g) / 5 g  Avoid: Ham (3 oz/85 g) / 6 g  Choose: Ham, lean cut (3 oz/85 g) / 2.4 g  Avoid: Chicken, with skin, dark meat (3 oz/85 g) / 4 g  Choose: Chicken, skin removed, dark meat (3 oz/85 g) / 2 g  Avoid: Chicken, with skin, light meat (3 oz/85 g) / 2.5 g    Choose: Chicken, skin removed, light meat (3 oz/85 g) / 1 g Dairy / Saturated Fat (g)  Avoid: Whole milk (1 cup) / 5 g  Choose: Low-fat milk, 2% (1 cup) / 3 g  Choose: Low-fat milk, 1% (1 cup) / 1.5 g  Choose: Skim milk (1 cup) / 0.3 g  Avoid: Hard cheese (1 oz/28 g) / 6 g  Choose: Skim milk cheese (1 oz/28 g) / 2 to 3 g  Avoid: Cottage cheese, 4% fat (1 cup) / 6.5 g  Choose: Low-fat cottage cheese, 1% fat (1 cup) / 1.5 g  Avoid: Ice cream (1 cup) / 9 g  Choose: Sherbet (1 cup) / 2.5 g  Choose: Nonfat frozen yogurt (1 cup) / 0.3 g  Choose: Frozen fruit bar /  trace  Avoid: Whipped cream (1 tbs) / 3.5 g  Choose: Nondairy whipped topping (1 tbs) / 1 g Condiments / Saturated Fat (g)  Avoid: Mayonnaise (1 tbs) / 2 g  Choose: Low-fat mayonnaise (1 tbs) / 1 g  Avoid: Butter (1 tbs) / 7 g  Choose: Extra light margarine (1 tbs) / 1 g  Avoid: Coconut oil (1 tbs) / 11.8 g  Choose: Olive oil (1 tbs) / 1.8 g  Choose: Corn oil (1 tbs) / 1.7 g  Choose: Safflower oil (1 tbs) / 1.2 g  Choose: Sunflower oil (1 tbs) / 1.4 g  Choose: Soybean oil (1 tbs) / 2.4 g  Choose: Canola oil (1 tbs) / 1 g Document Released: 08/20/2005 Document Revised: 12/15/2012 Document Reviewed: 02/08/2011 [employer] Patient Information 2014 [employer].  

## 2013-11-13 NOTE — Progress Notes (Signed)
Subjective:    Patient ID: Timothy Vaughn, male    DOB: 03-26-1929, 80 y.o.   MRN: [id number]  HPI Comments: 80 yo male presents for 3 month F/U for HTN, Cholesterol, Pre-Dm, D. Deficient. He is not exercising as much with cold weather. He is eating healthy for the most part. He notes BP is good. He notes mild allergy drainage increase with the weather changes but denies adding any OTC.  CHOL         185   08/14/2013 HDL           58   08/14/2013 LDLCALC      109   08/14/2013 TRIG          89   08/14/2013 CHOLHDL      3.2   08/14/2013 ALT           22   08/14/2013 AST           24   08/14/2013 ALKPHOS       53   08/14/2013 BILITOT      0.8   08/14/2013 CREATININE     0.90   08/14/2013 BUN              26   08/14/2013 NA              142   08/14/2013 K               4.6   08/14/2013 CL               99   08/14/2013 CO2              35   08/14/2013 WBC      6.1   08/14/2013 HGB     15.6   08/14/2013 HCT     45.5   08/14/2013 MCV     96.4   08/14/2013 PLT      166   08/14/2013 HGBA1C      5.8   08/14/2013 D 92  Hyperlipidemia    Current Outpatient Prescriptions on File Prior to Visit  Medication Sig Dispense Refill  . Ascorbic Acid (VITAMIN C) 100 MG tablet Take 100 mg by mouth daily.      Marland Kitchen aspirin 81 MG tablet Take 81 mg by mouth daily.      . Cholecalciferol (VITAMIN D) 2000 UNITS tablet Take 2,000 Units by mouth daily. Takes 8000 per day      . Flaxseed, Linseed, (FLAXSEED OIL PO) Take by mouth daily.      . furosemide (LASIX) 40 MG tablet Take 40 mg by mouth.      . Multiple Vitamin (MULTI VITAMIN DAILY PO) Take by mouth.      . Omega-3 Fatty Acids (FISH OIL PO) Take by mouth.      . pravastatin (PRAVACHOL) 40 MG tablet Take 40 mg by mouth daily.       No current facility-administered medications on file prior to visit.   Allergies  Allergen Reactions  . Lipitor [Atorvastatin] Other (See Comments)    Severe NIV  . Niaspan [Niacin Er] Itching   Past Medical  History  Diagnosis Date  . Hypertension   . Hyperlipidemia   . Pre-diabetes   . Vitamin D deficiency   . Cancer     prostate     Review of Systems  HENT: Positive for postnasal drip.   All other systems reviewed and are negative.   BP 116/60  Pulse 68  Temp(Src) 97.8 F (36.6 C) (Temporal)  Resp 18  Ht 5\' 4"  (1.626 m)  Wt 146 lb (66.225 kg)  BMI 25.05 kg/m2     Objective:   Physical Exam  Nursing note and vitals reviewed. Constitutional: He is oriented to person, place, and time. He appears well-developed and well-nourished.  HENT:  Head: Normocephalic and atraumatic.  Right Ear: External ear normal.  Left Ear: External ear normal.  Nose: Nose normal.  Mouth/Throat: No oropharyngeal exudate.  Cloudy TM's bilaterally Cobbl[place] posterior pharynx   Eyes: Conjunctivae and EOM are normal.  Neck: Normal range of motion. Neck supple. No JVD present. No thyromegaly present.  Cardiovascular: Normal rate, regular rhythm, normal heart sounds and intact distal pulses.   Pulmonary/Chest: Effort normal and breath sounds normal.  Abdominal: Soft. Bowel sounds are normal. He exhibits no distension and no mass. There is no tenderness. There is no rebound and no guarding.  Musculoskeletal: Normal range of motion. He exhibits no edema and no tenderness.  Lymphadenopathy:    He has no cervical adenopathy.  Neurological: He is alert and oriented to person, place, and time. He has normal reflexes. No cranial nerve deficit. Coordination normal.  Skin: Skin is warm and dry.  Psychiatric: He has a normal mood and affect. His behavior is normal. Judgment and thought content normal.          Assessment & Plan:  1.  3 month F/U for HTN, Cholesterol, Pre-Dm, D. Deficient. Needs healthy diet, cardio QD and obtain healthy weight. Check Labs, Check BP if >130/80 call office  2. Allergic rhinitis- Allegra OTC, increase H2o, allergy hygiene explained.

## 2013-11-16 LAB — VITAMIN D 25 HYDROXY (VIT D DEFICIENCY, FRACTURES): Vit D, 25-Hydroxy: 99 ng/mL — ABNORMAL HIGH (ref 30–89)

## 2014-02-08 DIAGNOSIS — D235 Other benign neoplasm of skin of trunk: Secondary | ICD-10-CM | POA: Diagnosis not present

## 2014-02-08 DIAGNOSIS — D239 Other benign neoplasm of skin, unspecified: Secondary | ICD-10-CM | POA: Diagnosis not present

## 2014-02-08 DIAGNOSIS — L570 Actinic keratosis: Secondary | ICD-10-CM | POA: Diagnosis not present

## 2014-02-08 DIAGNOSIS — L821 Other seborrheic keratosis: Secondary | ICD-10-CM | POA: Diagnosis not present

## 2014-02-08 DIAGNOSIS — Z85828 Personal history of other malignant neoplasm of skin: Secondary | ICD-10-CM | POA: Diagnosis not present

## 2014-02-08 DIAGNOSIS — D2320 Other benign neoplasm of skin of unspecified ear and external auricular canal: Secondary | ICD-10-CM | POA: Diagnosis not present

## 2014-02-08 DIAGNOSIS — C44519 Basal cell carcinoma of skin of other part of trunk: Secondary | ICD-10-CM | POA: Diagnosis not present

## 2014-02-10 ENCOUNTER — Encounter: Payer: Self-pay | Admitting: Internal Medicine

## 2014-02-10 ENCOUNTER — Ambulatory Visit (INDEPENDENT_AMBULATORY_CARE_PROVIDER_SITE_OTHER): Payer: Medicare Other | Admitting: Internal Medicine

## 2014-02-10 VITALS — BP 128/78 | HR 64 | Temp 98.1°F | Resp 18 | Ht 64.0 in | Wt 151.0 lb

## 2014-02-10 DIAGNOSIS — Z125 Encounter for screening for malignant neoplasm of prostate: Secondary | ICD-10-CM

## 2014-02-10 DIAGNOSIS — E782 Mixed hyperlipidemia: Secondary | ICD-10-CM

## 2014-02-10 DIAGNOSIS — Z789 Other specified health status: Secondary | ICD-10-CM

## 2014-02-10 DIAGNOSIS — R7309 Other abnormal glucose: Secondary | ICD-10-CM | POA: Diagnosis not present

## 2014-02-10 DIAGNOSIS — Z1212 Encounter for screening for malignant neoplasm of rectum: Secondary | ICD-10-CM

## 2014-02-10 DIAGNOSIS — E559 Vitamin D deficiency, unspecified: Secondary | ICD-10-CM

## 2014-02-10 DIAGNOSIS — I10 Essential (primary) hypertension: Secondary | ICD-10-CM | POA: Diagnosis not present

## 2014-02-10 DIAGNOSIS — Z79899 Other long term (current) drug therapy: Secondary | ICD-10-CM | POA: Insufficient documentation

## 2014-02-10 DIAGNOSIS — Z1331 Encounter for screening for depression: Secondary | ICD-10-CM

## 2014-02-10 LAB — CBC WITH DIFFERENTIAL/PLATELET
BASOS ABS: 0 10*3/uL (ref 0.0–0.1)
BASOS PCT: 0 % (ref 0–1)
EOS ABS: 0.1 10*3/uL (ref 0.0–0.7)
Eosinophils Relative: 1 % (ref 0–5)
HEMATOCRIT: 43 % (ref 39.0–52.0)
Hemoglobin: 14.6 g/dL (ref 13.0–17.0)
Lymphocytes Relative: 27 % (ref 12–46)
Lymphs Abs: 1.7 10*3/uL (ref 0.7–4.0)
MCH: 32.4 pg (ref 26.0–34.0)
MCHC: 34 g/dL (ref 30.0–36.0)
MCV: 95.3 fL (ref 78.0–100.0)
MONO ABS: 0.6 10*3/uL (ref 0.1–1.0)
MONOS PCT: 10 % (ref 3–12)
NEUTROS PCT: 62 % (ref 43–77)
Neutro Abs: 3.9 10*3/uL (ref 1.7–7.7)
Platelets: 159 10*3/uL (ref 150–400)
RBC: 4.51 MIL/uL (ref 4.22–5.81)
RDW: 13.3 % (ref 11.5–15.5)
WBC: 6.3 10*3/uL (ref 4.0–10.5)

## 2014-02-10 LAB — HEMOGLOBIN A1C
HEMOGLOBIN A1C: 5.8 % — AB (ref <5.7)
MEAN PLASMA GLUCOSE: 120 mg/dL — AB (ref <117)

## 2014-02-10 NOTE — Patient Instructions (Signed)

## 2014-02-10 NOTE — Progress Notes (Signed)
Patient ID: Timothy Vaughn, male   DOB: 08-09-1929, 80 y.o.   MRN: [id number]   Annual Screening Comprehensive Examination  This very nice 80 y.o.MWM presents for complete physical.  Patient has been followed for HTN,  Prediabetes, Hyperlipidemia, Restrictive Lung Dz and Vitamin D Deficiency.   HTN predates since 2004. Patient's BP has been controlled at home.Today's BP: 128/78 mmHg. Patient denies any cardiac symptoms as chest pain, palpitations, shortness of breath, dizziness or ankle swelling.   Patient's hyperlipidemia is controlled with diet and medications. Patient denies myalgias or other medication SE's. Last lips in Mar 2015 as below were at goal.   Lab Results  Component Value Date   CHOL 175 11/13/2013   HDL 53 11/13/2013   LDLCALC 99 11/13/2013   TRIG 117 11/13/2013   CHOLHDL 3.3 11/13/2013    Patient has prediabetes with A1c 5.8% in Feb 2013 and 6.0% in 02/2012 & Mar 2014 and last A1c was 6.0% in Mar 2015. Patient denies reactive hypoglycemic symptoms, visual blurring, diabetic polys or paresthesias.    Patient has Severe Restrictive Lung Dz consequent to Severe Kyphoscoliosis and has done well over the last year w/o any exacerbations of respiratory infections.   Finally, patient has history of Vitamin D Deficiency of 35 in 2008 and last vitamin D was 92 in Sept 2014.  Medication Sig  . VITAMIN C 100 MG tablet Take 100 mg by mouth daily.  Marland Kitchen aspirin 81 MG tablet Take 81 mg by mouth daily.  Marland Kitchen VITAMIN D 2000 UNITS  Take 2,000 Units by mouth daily. Takes 4000 per day  . FLAXSEED OIL Take by mouth daily.  . furosemide  40 MG tablet Take 40 mg by mouth.  Leighton Parody VITAMIN  Take by mouth.  Marland Kitchen FISH OIL Take by mouth.  . pravastatin  40 MG tablet Take 40 mg by mouth daily.   Allergies  Allergen Reactions  . Lipitor [Atorvastatin] Other (See Comments)    Severe NIV  . Niaspan [Niacin Er] Itching    Past Medical History  Diagnosis Date  . Hypertension   . Hyperlipidemia   .  Pre-diabetes   . Vitamin D deficiency   . Cancer     prostate   Past Surgical History  Procedure Laterality Date  . Prostatectomy  2001   Family History  Problem Relation Age of Onset  . Heart disease Mother   . COPD Mother   . Cancer Father     colon  . Alzheimer's disease Father   . Heart disease Father   . Cancer Brother     stomach  . Stroke Maternal Grandfather   . Diabetes Maternal Grandfather    History   Social History  . Marital Status: Married    Spouse Name: N/A    Number of Children: N/A  . Years of Education: N/A   Social History Main Topics  . Smoking status: Never Smoker   . Smokeless tobacco: Not on file  . Alcohol Use: No  . Drug Use: No  . Sexual Activity: Not on file      ROS Constitutional: Denies fever, chills, weight loss/gain, headaches, insomnia, fatigue, night sweats or change in appetite. Eyes: Denies redness, blurred vision, diplopia, discharge, itchy or watery eyes.  ENT: Denies discharge, congestion, post nasal drip, epistaxis, sore throat, earache, hearing loss, dental pain, Tinnitus, Vertigo, Sinus pain or snoring.  Cardio: Denies chest pain, palpitations, irregular heartbeat, syncope, dyspnea, diaphoresis, orthopnea, PND, claudication or edema Respiratory: denies cough,  dyspnea, DOE, pleurisy, hoarseness, laryngitis or wheezing.  Gastrointestinal: Denies dysphagia, heartburn, reflux, water brash, pain, cramps, nausea, vomiting, bloating, diarrhea, constipation, hematemesis, melena, hematochezia, jaundice or hemorrhoids Genitourinary: Denies dysuria, frequency, urgency, nocturia, hesitancy, discharge, hematuria or flank pain Musculoskeletal: Denies arthralgia, myalgia, stiffness, Jt. Swelling, pain, limp or strain/sprain. Skin: Denies puritis, rash, hives, warts, acne, eczema or change in skin lesion Neuro: No weakness, tremor, incoordination, spasms, paresthesia or pain Psychiatric: Denies confusion, memory loss or sensory  loss Endocrine: Denies change in weight, skin, hair change, nocturia, and paresthesia, diabetic polys, visual blurring or hyper / hypo glycemic episodes.  Heme/Lymph: No excessive bleeding, bruising or enlarged lymph nodes.  Physical Exam  BP 128/78  Pulse 64  Temp 98.1 F   Resp 18  Ht 5\' 4"    Wt 151 lb   BMI 25.91 kg/m2  General Appearance: Well nourished, in no apparent distress. Eyes: PERRLA, EOMs, conjunctiva no swelling or erythema, normal fundi and vessels. Sinuses: No frontal/maxillary tenderness ENT/Mouth: EACs patent / TMs  nl. Nares clear without erythema, swelling, mucoid exudates. Oral hygiene is good. No erythema, swelling, or exudate. Tongue normal, non-obstructing. Tonsils not swollen or erythematous. Hearing normal.  Neck: Supple, thyroid normal. No bruits, nodes or JVD. Respiratory: Respiratory effort normal.  BS equal and clear bilateral without rales, rhonci, wheezing or stridor. Cardio: Heart sounds are normal with regular rate and rhythm and no murmurs, rubs or gallops. Peripheral pulses are normal and equal bilaterally without edema. No aortic or femoral bruits. Chest: symmetric with normal excursions and percussion.  Abdomen: Flat, soft, with bowl sounds. Nontender, no guarding, rebound, hernias, masses, or organomegaly.  Lymphatics: Non tender without lymphadenopathy.  Genitourinary: No hernias.Testes nl. DRE - prostate nl for age - smooth & firm w/o nodules. Musculoskeletal: Full ROM all peripheral extremities, joint stability, 5/5 strength, and normal gait. Denies Falls. Skin: Warm and dry without rashes, lesions, cyanosis, clubbing or  ecchymosis.  Neuro: Cranial nerves intact, reflexes equal bilaterally. Normal muscle tone, no cerebellar symptoms. Sensation intact.  Pysch: Awake and oriented X 3, normal affect, insight and judgment appropriate. Denies Depression Sx's.  Assessment and Plan  1. Annual Screening Examination 2. Hypertension  3.  Hyperlipidemia 4. Pre Diabetes 5. Vitamin D Deficiency 6. Restrictive Lung Dz due to severe Kyphoscoliosis  Continue prudent diet as discussed, weight control, BP monitoring, regular exercise, and medications as discussed.  Discussed med effects and SE's. Routine screening labs and tests as requested with regular follow-up as recommended.

## 2014-02-11 LAB — HEPATIC FUNCTION PANEL
ALBUMIN: 4.3 g/dL (ref 3.5–5.2)
ALT: 24 U/L (ref 0–53)
AST: 28 U/L (ref 0–37)
Alkaline Phosphatase: 54 U/L (ref 39–117)
Bilirubin, Direct: 0.2 mg/dL (ref 0.0–0.3)
Indirect Bilirubin: 0.6 mg/dL (ref 0.2–1.2)
TOTAL PROTEIN: 6.7 g/dL (ref 6.0–8.3)
Total Bilirubin: 0.8 mg/dL (ref 0.2–1.2)

## 2014-02-11 LAB — LIPID PANEL
Cholesterol: 183 mg/dL (ref 0–200)
HDL: 61 mg/dL (ref >39)
LDL Cholesterol: 107 mg/dL — ABNORMAL HIGH (ref 0–99)
Total CHOL/HDL Ratio: 3 Ratio
Triglycerides: 73 mg/dL (ref <150)
VLDL: 15 mg/dL (ref 0–40)

## 2014-02-11 LAB — PSA: PSA: <0.01 ng/mL (ref <=4.00)

## 2014-02-11 LAB — URINALYSIS, MICROSCOPIC ONLY
BACTERIA UA: NONE SEEN
CRYSTALS: NONE SEEN
Casts: NONE SEEN
Squamous Epithelial / LPF: NONE SEEN

## 2014-02-11 LAB — BASIC METABOLIC PANEL WITH GFR
BUN: 20 mg/dL (ref 6–23)
CALCIUM: 9.1 mg/dL (ref 8.4–10.5)
CO2: 32 mEq/L (ref 19–32)
CREATININE: 0.76 mg/dL (ref 0.50–1.35)
Chloride: 102 mEq/L (ref 96–112)
GFR, Est African American: >89 mL/min
GFR, Est Non African American: 85 mL/min
GLUCOSE: 87 mg/dL (ref 70–99)
Potassium: 4.4 mEq/L (ref 3.5–5.3)
Sodium: 142 mEq/L (ref 135–145)

## 2014-02-11 LAB — TSH: TSH: 0.691 u[IU]/mL (ref 0.350–4.500)

## 2014-02-11 LAB — VITAMIN D 25 HYDROXY (VIT D DEFICIENCY, FRACTURES): Vit D, 25-Hydroxy: 76 ng/mL (ref 30–89)

## 2014-02-11 LAB — MICROALBUMIN / CREATININE URINE RATIO
Creatinine, Urine: 94 mg/dL
MICROALB UR: 1.53 mg/dL (ref 0.00–1.89)
MICROALB/CREAT RATIO: 16.3 mg/g (ref 0.0–30.0)

## 2014-02-11 LAB — MAGNESIUM: Magnesium: 2 mg/dL (ref 1.5–2.5)

## 2014-02-11 LAB — INSULIN, FASTING: INSULIN FASTING, SERUM: 9 u[IU]/mL (ref 3–28)

## 2014-03-04 ENCOUNTER — Other Ambulatory Visit (INDEPENDENT_AMBULATORY_CARE_PROVIDER_SITE_OTHER): Payer: Medicare Other

## 2014-03-04 DIAGNOSIS — Z1212 Encounter for screening for malignant neoplasm of rectum: Secondary | ICD-10-CM

## 2014-03-04 LAB — POC HEMOCCULT BLD/STL (HOME/3-CARD/SCREEN)
Card #2 Fecal Occult Blod, POC: NEGATIVE
Card #3 Fecal Occult Blood, POC: NEGATIVE
Fecal Occult Blood, POC: NEGATIVE

## 2014-05-21 ENCOUNTER — Encounter: Payer: Self-pay | Admitting: Internal Medicine

## 2014-05-21 ENCOUNTER — Ambulatory Visit: Payer: Self-pay | Admitting: Physician Assistant

## 2014-05-21 ENCOUNTER — Ambulatory Visit (INDEPENDENT_AMBULATORY_CARE_PROVIDER_SITE_OTHER): Payer: Medicare Other | Admitting: Internal Medicine

## 2014-05-21 VITALS — BP 120/68 | HR 60 | Temp 98.6°F | Resp 16 | Ht 64.0 in | Wt 145.0 lb

## 2014-05-21 DIAGNOSIS — E782 Mixed hyperlipidemia: Secondary | ICD-10-CM

## 2014-05-21 DIAGNOSIS — E559 Vitamin D deficiency, unspecified: Secondary | ICD-10-CM

## 2014-05-21 DIAGNOSIS — R7309 Other abnormal glucose: Secondary | ICD-10-CM

## 2014-05-21 DIAGNOSIS — Z0000 Encounter for general adult medical examination without abnormal findings: Secondary | ICD-10-CM

## 2014-05-21 DIAGNOSIS — Z79899 Other long term (current) drug therapy: Secondary | ICD-10-CM

## 2014-05-21 DIAGNOSIS — J984 Other disorders of lung: Secondary | ICD-10-CM

## 2014-05-21 DIAGNOSIS — I10 Essential (primary) hypertension: Secondary | ICD-10-CM

## 2014-05-21 DIAGNOSIS — M419 Scoliosis, unspecified: Secondary | ICD-10-CM

## 2014-05-21 NOTE — Progress Notes (Signed)
Patient ID: Timothy Vaughn, male   DOB: 1929-04-18, 80 y.o.   MRN: [id number]  MEDICARE ANNUAL WELLNESS VISIT AND Quarterly OV  Assessment:   1. Hypertension  - TSH  2. Hyperlipidemia  - Lipid panel  3. PreDiabetes  - Hemoglobin A1c - Insulin, fasting  4. Vitamin D Deficiency  - Vit D  25 hydroxy (rtn osteoporosis monitoring)  5. Restrictive lung disease due to kyphoscoliosis   6. Encounter for long-term (current) use of other medications  - CBC with Differential - BASIC METABOLIC PANEL WITH GFR - Hepatic function panel - Magnesium  7. Routine general medical examination at a health care facility  Plan:   During the course of the visit the patient was educated and counseled about appropriate screening and preventive services including:    Pneumococcal vaccine   Influenza vaccine  Td vaccine  Screening electrocardiogram  Bone densitometry screening  Colorectal cancer screening  Diabetes screening  Glaucoma screening  Nutrition counseling   Advanced directives: requested  Screening recommendations, referrals: Vaccinations: Tdap vaccine not indicated Influenza vaccine ordered Pneumococcal vaccine not indicated Shingles vaccine not indicated Hep B vaccine not indicated  Nutrition assessed and recommended  Colonoscopy not indicated Recommended yearly ophthalmology/optometry visit for glaucoma screening and checkup Recommended yearly dental visit for hygiene and checkup Advanced directives - ordered  Conditions/risks identified: BMI: Discussed weight loss, diet, and increase physical activity.  Increase physical activity: AHA recommends 150 minutes of physical activity a week.  Medications reviewed PreDiabetes is at goal, ACE/ARB therapy: No, Reason not on Ace Inhibitor/ARB therapy:  not indicated with pre Diabetes Urinary Incontinence is not an issue: discussed non pharmacology and pharmacology options.  Fall risk: low- discussed PT, home  fall assessment, medications.   Subjective:  Timothy Vaughn is a 80 y.o. male who presents for Medicare Annual Wellness Visit and complete physical.  Date of last medicare wellness visit is unknown.  He has had elevated blood pressure since 2004. His blood pressure has been controlled at home, today their BP is BP: 120/68 mmHg He does workout. He denies chest pain, shortness of breath, dizziness.  He is on cholesterol medication and denies myalgias. His cholesterol is neart  goal. The cholesterol last visit was:   Lab Results  Component Value Date   CHOL 183 02/10/2014   HDL 61 02/10/2014   LDLCALC 107* 02/10/2014   TRIG 73 02/10/2014   CHOLHDL 3.0 02/10/2014   He has had PreDiabetes for 3 & 1/2 years. He has been working on diet and exercise for prediabetes, and denies foot ulcerations, hyperglycemia, hypoglycemia , increased appetite, nausea, paresthesia of the feet, polydipsia, polyuria and visual disturbances. Last A1C in the office was:  Lab Results  Component Value Date   HGBA1C 5.8* 02/10/2014   Patient is on Vitamin D supplement.   Lab Results  Component Value Date   VD25OH 76 02/10/2014     Names of Other Physician/Practitioners you currently use: 1. [facility] here for primary care 2. DrHeather Omen, eye doctor, last visit Dec 2014 3. Dr Yong Channel, dentist, last visit July 2015 every 3-4 months  Patient Care Team: Unk Pinto, MD as PCP - General (Internal Medicine) Inda Castle, MD as Consulting Physician (Gastroenterology) Ailene Rud, MD as Consulting Physician (Urology)  Medication Review: Medication Sig  . VITAMIN C 100 MG tablet Take 100 mg by mouth daily.  Marland Kitchen aspirin 81 MG tablet Take 81 mg by mouth daily.  Marland Kitchen VITAMIN D  2000 UNITS tablet Take 2,000 Units by mouth daily. Takes 4000 per day  . FLAXSEED OIL  Take by mouth daily.  . fluorouracil (EFUDEX) 5 % cream   . furosemide  40 MG tablet Take 40 mg by mouth.  Leighton Parody VITAMIN  Take by mouth.  Marland Kitchen FISH OIL  Take by mouth.  . Pravastatin 40 MG tablet Take 40 mg by mouth daily.   Current Problems (verified) Patient Active Problem List   Diagnosis Date Noted  . Encounter for long-term (current) use of other medications 02/10/2014  . Hypertension 08/14/2013  . Hyperlipidemia 08/14/2013  . PreDiabetes 08/14/2013  . Vitamin D Deficiency 08/14/2013  . Kyphoscoliosis 08/14/2013  . Restrictive lung disease due to kyphoscoliosis 08/14/2013   Screening Tests Health Maintenance  Topic Date Due  . Tetanus/tdap  08/[birth year]/1950  . Influenza Vaccine  04/03/2014  . Colonoscopy  12/03/2015  . Pneumococcal Polysaccharide Vaccine Age 57 And Over  Completed  . Zostavax  Completed   Immunization History  Administered Date(s) Administered  . DTaP 09/03/2005  . Influenza Whole 05/20/2013  . Pneumococcal Polysaccharide-23 02/05/2012  . Zoster 09/25/2005   Preventative care: Last colonoscopy: 2007 ( f/u in 10 yrs)  Prior vaccinations: Tdap: 09/2005  Influenza: 05/2013 Pneumococcal: 02/2012 Shingles/Zostavax: 09/2005  History reviewed: allergies, current medications, past family history, past medical history, past social history, past surgical history and problem list   Risk Factors: Tobacco History  Substance Use Topics  . Smoking status: Never Smoker   . Smokeless tobacco: Not on file  . Alcohol Use: No   He does not smoke.  Patient is not a former smoker. Are there smokers in your home (other than you)?  No  Alcohol Current alcohol use: none  Caffeine Current caffeine use: coffee 0-1 /day  Exercise Current exercise: walking and yard work  Nutrition/Diet Current diet: in general, a "healthy" diet    Cardiac risk factors: advanced age (older than 48 for men, 34 for women), dyslipidemia, hypertension, male gender and sedentary lifestyle.  Depression Screen (Note: if answer to either of the following is "Yes", a more complete depression  screening is indicated)   Q1: Over the past two weeks, have you felt down, depressed or hopeless? No  Q2: Over the past two weeks, have you felt little interest or pleasure in doing things? No  Have you lost interest or pleasure in daily life? No  Do you often feel hopeless? No  Do you cry easily over simple problems? No  Activities of Daily Living In your present state of health, do you have any difficulty performing the following activities?:  Driving? No Managing money?  No Feeding yourself? No Getting from bed to chair? No Climbing a flight of stairs? No Preparing food and eating?: No Bathing or showering? No Getting dressed: No Getting to the toilet? No Using the toilet:No Moving around from place to place: No In the past year have you fallen or had a near fall?:No   Are you sexually active?  No  Do you have more than one partner?  No  Vision Difficulties: No  Hearing Difficulties: No Do you often ask people to speak up or repeat themselves? No Do you experience ringing or noises in your ears? No Do you have difficulty understanding soft or whispered voices? No  Cognition  Do you feel that you have a problem with memory?No  Do you often misplace items? No  Do you feel safe at home?  Yes  Advanced directives  Does patient have a Cooter? No Does patient have a Living Will? No  Objective:     Blood pressure 120/68, pulse 60, temperature 98.6 F (37 C), resp. rate 16, height 5\' 4"  (1.626 m), weight 145 lb (65.772 kg). Body mass index is 24.88 kg/(m^2).  General appearance: alert, no distress, WD/WN, male Cognitive Testing  Alert? Yes  Normal Appearance?Yes  Oriented to person? Yes  Place? Yes   Time? Yes  Recall of three objects?  Yes  Can perform simple calculations? Yes  Displays appropriate judgment?Yes  Can read the correct time from a watch face?Yes  HEENT: normocephalic, sclerae anicteric, TMs pearly, nares patent, no discharge or  erythema, pharynx normal Oral cavity: MMM, no lesions Neck: supple, no lymphadenopathy, no thyromegaly, no masses Heart: RRR, normal S1, S2, no murmurs Lungs: CTA bilaterally, no wheezes, rhonchi, or rales Abdomen: +bs, soft, non tender, non distended, no masses, no hepatomegaly, no splenomegaly Musculoskeletal: nontender, no swelling, no obvious deformity Extremities: no edema, no cyanosis, no clubbing Pulses: 2+ symmetric, upper and lower extremities, normal cap refill Neurological: alert, oriented x 3, CN2-12 intact, strength normal upper extremities and lower extremities, sensation normal throughout, DTRs 2+ throughout, no cerebellar signs, gait normal Psychiatric: normal affect, behavior normal, pleasant   Medicare Attestation I have personally reviewed: The patient's medical and social history Their use of alcohol, tobacco or illicit drugs Their current medications and supplements The patient's functional ability including ADLs,fall risks, home safety risks, cognitive, and hearing and visual impairment Diet and physical activities Evidence for depression or mood disorders  The patient's weight, height, BMI, and visual acuity have been recorded in the chart.  I have made referrals, counseling, and provided education to the patient based on review of the above and I have provided the patient with a written personalized care plan for preventive services.    Jaton Eilers DAVID, MD   05/21/2014

## 2014-05-21 NOTE — Patient Instructions (Signed)

## 2014-05-22 LAB — BASIC METABOLIC PANEL WITH GFR
BUN: 28 mg/dL — ABNORMAL HIGH (ref 6–23)
CALCIUM: 9.1 mg/dL (ref 8.4–10.5)
CO2: 34 meq/L — AB (ref 19–32)
Chloride: 102 mEq/L (ref 96–112)
Creat: 0.82 mg/dL (ref 0.50–1.35)
GFR, Est African American: >89 mL/min
GFR, Est Non African American: 82 mL/min
GLUCOSE: 98 mg/dL (ref 70–99)
POTASSIUM: 4.5 meq/L (ref 3.5–5.3)
SODIUM: 142 meq/L (ref 135–145)

## 2014-05-22 LAB — CBC WITH DIFFERENTIAL/PLATELET
Basophils Absolute: 0 10*3/uL (ref 0.0–0.1)
Basophils Relative: 0 % (ref 0–1)
EOS ABS: 0.1 10*3/uL (ref 0.0–0.7)
Eosinophils Relative: 1 % (ref 0–5)
HCT: 43.8 % (ref 39.0–52.0)
HEMOGLOBIN: 14.6 g/dL (ref 13.0–17.0)
LYMPHS ABS: 1.7 10*3/uL (ref 0.7–4.0)
Lymphocytes Relative: 29 % (ref 12–46)
MCH: 32.3 pg (ref 26.0–34.0)
MCHC: 33.3 g/dL (ref 30.0–36.0)
MCV: 96.9 fL (ref 78.0–100.0)
MONOS PCT: 8 % (ref 3–12)
Monocytes Absolute: 0.5 10*3/uL (ref 0.1–1.0)
Neutro Abs: 3.5 10*3/uL (ref 1.7–7.7)
Neutrophils Relative %: 62 % (ref 43–77)
Platelets: 162 10*3/uL (ref 150–400)
RBC: 4.52 MIL/uL (ref 4.22–5.81)
RDW: 13.4 % (ref 11.5–15.5)
WBC: 5.7 10*3/uL (ref 4.0–10.5)

## 2014-05-22 LAB — INSULIN, FASTING: Insulin fasting, serum: 7.3 u[IU]/mL (ref 2.0–19.6)

## 2014-05-22 LAB — LIPID PANEL
Cholesterol: 159 mg/dL (ref 0–200)
HDL: 57 mg/dL (ref >39)
LDL Cholesterol: 89 mg/dL (ref 0–99)
Total CHOL/HDL Ratio: 2.8 Ratio
Triglycerides: 66 mg/dL (ref <150)
VLDL: 13 mg/dL (ref 0–40)

## 2014-05-22 LAB — HEPATIC FUNCTION PANEL
ALK PHOS: 54 U/L (ref 39–117)
ALT: 19 U/L (ref 0–53)
AST: 23 U/L (ref 0–37)
Albumin: 4.2 g/dL (ref 3.5–5.2)
Bilirubin, Direct: 0.2 mg/dL (ref 0.0–0.3)
Indirect Bilirubin: 0.6 mg/dL (ref 0.2–1.2)
Total Bilirubin: 0.8 mg/dL (ref 0.2–1.2)
Total Protein: 6.4 g/dL (ref 6.0–8.3)

## 2014-05-22 LAB — HEMOGLOBIN A1C
Hgb A1c MFr Bld: 6 % — ABNORMAL HIGH (ref <5.7)
MEAN PLASMA GLUCOSE: 126 mg/dL — AB (ref <117)

## 2014-05-22 LAB — TSH: TSH: 0.495 u[IU]/mL (ref 0.350–4.500)

## 2014-05-22 LAB — MAGNESIUM: Magnesium: 1.9 mg/dL (ref 1.5–2.5)

## 2014-05-22 LAB — VITAMIN D 25 HYDROXY (VIT D DEFICIENCY, FRACTURES): Vit D, 25-Hydroxy: 73 ng/mL (ref 30–89)

## 2014-06-04 ENCOUNTER — Ambulatory Visit (INDEPENDENT_AMBULATORY_CARE_PROVIDER_SITE_OTHER): Payer: Medicare Other

## 2014-06-04 DIAGNOSIS — Z23 Encounter for immunization: Secondary | ICD-10-CM

## 2014-06-27 ENCOUNTER — Other Ambulatory Visit: Payer: Self-pay | Admitting: Internal Medicine

## 2014-08-11 DIAGNOSIS — L570 Actinic keratosis: Secondary | ICD-10-CM | POA: Diagnosis not present

## 2014-08-11 DIAGNOSIS — L821 Other seborrheic keratosis: Secondary | ICD-10-CM | POA: Diagnosis not present

## 2014-08-11 DIAGNOSIS — C44319 Basal cell carcinoma of skin of other parts of face: Secondary | ICD-10-CM | POA: Diagnosis not present

## 2014-08-11 DIAGNOSIS — D1801 Hemangioma of skin and subcutaneous tissue: Secondary | ICD-10-CM | POA: Diagnosis not present

## 2014-08-11 DIAGNOSIS — D2262 Melanocytic nevi of left upper limb, including shoulder: Secondary | ICD-10-CM | POA: Diagnosis not present

## 2014-08-11 DIAGNOSIS — D225 Melanocytic nevi of trunk: Secondary | ICD-10-CM | POA: Diagnosis not present

## 2014-08-11 DIAGNOSIS — D2261 Melanocytic nevi of right upper limb, including shoulder: Secondary | ICD-10-CM | POA: Diagnosis not present

## 2014-08-11 DIAGNOSIS — Z85828 Personal history of other malignant neoplasm of skin: Secondary | ICD-10-CM | POA: Diagnosis not present

## 2014-08-30 ENCOUNTER — Ambulatory Visit (INDEPENDENT_AMBULATORY_CARE_PROVIDER_SITE_OTHER): Payer: Medicare Other | Admitting: Internal Medicine

## 2014-08-30 ENCOUNTER — Encounter: Payer: Self-pay | Admitting: Internal Medicine

## 2014-08-30 VITALS — BP 144/72 | HR 64 | Temp 97.9°F | Resp 16 | Ht 64.0 in | Wt 147.4 lb

## 2014-08-30 DIAGNOSIS — E782 Mixed hyperlipidemia: Secondary | ICD-10-CM | POA: Diagnosis not present

## 2014-08-30 DIAGNOSIS — Z79899 Other long term (current) drug therapy: Secondary | ICD-10-CM

## 2014-08-30 DIAGNOSIS — I10 Essential (primary) hypertension: Secondary | ICD-10-CM

## 2014-08-30 DIAGNOSIS — R7303 Prediabetes: Secondary | ICD-10-CM

## 2014-08-30 DIAGNOSIS — E559 Vitamin D deficiency, unspecified: Secondary | ICD-10-CM | POA: Diagnosis not present

## 2014-08-30 DIAGNOSIS — R7309 Other abnormal glucose: Secondary | ICD-10-CM | POA: Diagnosis not present

## 2014-08-30 LAB — CBC WITH DIFFERENTIAL/PLATELET
BASOS ABS: 0 10*3/uL (ref 0.0–0.1)
BASOS PCT: 0 % (ref 0–1)
Eosinophils Absolute: 0.1 10*3/uL (ref 0.0–0.7)
Eosinophils Relative: 1 % (ref 0–5)
HCT: 45.1 % (ref 39.0–52.0)
HEMOGLOBIN: 15.9 g/dL (ref 13.0–17.0)
Lymphocytes Relative: 27 % (ref 12–46)
Lymphs Abs: 1.5 10*3/uL (ref 0.7–4.0)
MCH: 33.1 pg (ref 26.0–34.0)
MCHC: 35.3 g/dL (ref 30.0–36.0)
MCV: 94 fL (ref 78.0–100.0)
MPV: 10.2 fL (ref 9.4–12.4)
Monocytes Absolute: 0.5 10*3/uL (ref 0.1–1.0)
Monocytes Relative: 9 % (ref 3–12)
NEUTROS ABS: 3.5 10*3/uL (ref 1.7–7.7)
NEUTROS PCT: 63 % (ref 43–77)
Platelets: 161 10*3/uL (ref 150–400)
RBC: 4.8 MIL/uL (ref 4.22–5.81)
RDW: 13.1 % (ref 11.5–15.5)
WBC: 5.5 10*3/uL (ref 4.0–10.5)

## 2014-08-30 LAB — HEMOGLOBIN A1C
Hgb A1c MFr Bld: 5.8 % — ABNORMAL HIGH (ref <5.7)
Mean Plasma Glucose: 120 mg/dL — ABNORMAL HIGH (ref <117)

## 2014-08-30 NOTE — Patient Instructions (Signed)

## 2014-08-30 NOTE — Progress Notes (Signed)
Patient ID: Timothy Vaughn, male   DOB: 05/21/1929, 80 y.o.   MRN: [id number]   This very nice 80 y.o.male presents for 3 month follow up with Hypertension, Hyperlipidemia, Pre-Diabetes and Vitamin D Deficiency.    Patient is treated for HTN (2004) & BP has been controlled at home. Today's BP is 144/72. Patient has had no complaints of any cardiac type chest pain, palpitations, dyspnea/orthopnea/PND, dizziness, claudication, or dependent edema.   Hyperlipidemia is controlled with diet & meds. Patient denies myalgias or other med SE's. Last Lipids were Total Chol 159; HDL 57; LDL  89; Trig 66 on 05/21/2014.   Also, the patient has history of Obesity (BMI > 25+) and consequent  PreDiabetes with A1c 6.0% in June 2013 and has had no symptoms of reactive hypoglycemia, diabetic polys, paresthesias or visual blurring.  Last A1c was  6.0% on  05/21/2014.   Further, the patient also has history of Vitamin D Deficiency and supplements vitamin D without any suspected side-effects. Last vitamin D was  73 on 05/21/2014.    Medication List   FISH OIL PO  Take by mouth.     FLAXSEED OIL PO  Take by mouth daily.     fluorouracil 5 % cream  Commonly known as:  EFUDEX  Applies PRN     furosemide 40 MG tablet  Commonly known as:  LASIX  Take 40 mg by mouth.     MULTI VITAMIN DAILY PO  Take by mouth.     pravastatin 40 MG tablet  Commonly known as:  PRAVACHOL  TAKE 1 TABLET AT BEDTIME FOR CHOLESTEROL     vitamin C 100 MG tablet  Take 100 mg by mouth daily.     Vitamin D 2000 UNITS tablet  Take 2,000 Units by mouth daily. Takes 4000 per day     Allergies  Allergen Reactions  . Lipitor [Atorvastatin] Other (See Comments)    Severe NIV  . Niaspan [Niacin Er] Itching   PMHx:   Past Medical History  Diagnosis Date  . Hypertension   . Hyperlipidemia   . Pre-diabetes   . Vitamin D deficiency   . Cancer     prostate   Immunization History  Administered Date(s) Administered  . DTaP  09/03/2005  . Influenza Whole 05/20/2013  . Influenza, High Dose Seasonal PF 06/04/2014  . Pneumococcal Polysaccharide-23 02/05/2012  . Zoster 09/25/2005   Past Surgical History  Procedure Laterality Date  . Prostatectomy  2001   FHx:    Reviewed / unchanged  SHx:    Reviewed / unchanged  Systems Review:  Constitutional: Denies fever, chills, wt changes, headaches, insomnia, fatigue, night sweats, change in appetite. Eyes: Denies redness, blurred vision, diplopia, discharge, itchy, watery eyes.  ENT: Denies discharge, congestion, post nasal drip, epistaxis, sore throat, earache, hearing loss, dental pain, tinnitus, vertigo, sinus pain, snoring.  CV: Denies chest pain, palpitations, irregular heartbeat, syncope, dyspnea, diaphoresis, orthopnea, PND, claudication or edema. Respiratory: denies cough, dyspnea, DOE, pleurisy, hoarseness, laryngitis, wheezing.  Gastrointestinal: Denies dysphagia, odynophagia, heartburn, reflux, water brash, abdominal pain or cramps, nausea, vomiting, bloating, diarrhea, constipation, hematemesis, melena, hematochezia  or hemorrhoids. Genitourinary: Denies dysuria, frequency, urgency, nocturia, hesitancy, discharge, hematuria or flank pain. Musculoskeletal: Denies arthralgias, myalgias, stiffness, jt. swelling, pain, limping or strain/sprain.  Skin: Denies pruritus, rash, hives, warts, acne, eczema or change in skin lesion(s). Neuro: No weakness, tremor, incoordination, spasms, paresthesia or pain. Psychiatric: Denies confusion, memory loss or sensory loss. Endo: Denies change in weight, skin or  hair change.  Heme/Lymph: No excessive bleeding, bruising or enlarged lymph nodes.  Physical Exam  BP 144/72   Pulse 64  Temp 97.9 F   Resp 16  Ht 5\' 4"    Wt 147 lb 6.4 oz    BMI 25.29   Appears well nourished and in no distress. Eyes: PERRLA, EOMs, conjunctiva no swelling or erythema. Sinuses: No frontal/maxillary tenderness ENT/Mouth: EAC's clear, TM's  nl w/o erythema, bulging. Nares clear w/o erythema, swelling, exudates. Oropharynx clear without erythema or exudates. Oral hygiene is good. Tongue normal, non obstructing. Hearing intact.  Neck: Supple. Thyroid nl. Car 2+/2+ without bruits, nodes or JVD. Chest: Severe roto kyphoscoliosis with distant and clear respirations nl with BS clear & equal w/o rales, rhonchi, wheezing or stridor.  Cor: Heart sounds normal w/ regular rate and rhythm without sig. murmurs, gallops, clicks, or rubs. Peripheral pulses normal and equal  without edema.  Abdomen: Soft & bowel sounds normal. Non-tender w/o guarding, rebound, hernias, masses, or organomegaly.  Lymphatics: Unremarkable.  Musculoskeletal: Full ROM all peripheral extremities, joint stability, 5/5 strength, and normal gait.  Skin: Warm, dry without exposed rashes, lesions or ecchymosis apparent.  Neuro: Cranial nerves intact, reflexes equal bilaterally. Sensory-motor testing grossly intact. Tendon reflexes grossly intact.  Pysch: Alert & oriented x 3.  Insight and judgement nl & appropriate. No ideations.  Assessment and Plan:  1. Hypertension - Continue monitor blood pressure at home. Continue diet/meds same.  2. Hyperlipidemia - Continue diet/meds, exercise,& lifestyle modifications. Continue monitor periodic cholesterol/liver & renal functions   3.  Pre-Diabetes - Continue diet, exercise, lifestyle modifications. Monitor appropriate labs.  4. Vitamin D Deficiency - Continue supplementation.  5. Restrictive Lung Dz due to severe Kyphoscoliosis   Recommended regular exercise, BP monitoring, weight control, and discussed med and SE's. Recommended labs to assess and monitor clinical status. Further disposition pending results of labs.

## 2014-08-31 LAB — BASIC METABOLIC PANEL WITH GFR
BUN: 27 mg/dL — AB (ref 6–23)
CALCIUM: 9.1 mg/dL (ref 8.4–10.5)
CO2: 33 mEq/L — ABNORMAL HIGH (ref 19–32)
Chloride: 100 mEq/L (ref 96–112)
Creat: 0.8 mg/dL (ref 0.50–1.35)
GFR, EST NON AFRICAN AMERICAN: 83 mL/min
Glucose, Bld: 99 mg/dL (ref 70–99)
POTASSIUM: 3.9 meq/L (ref 3.5–5.3)
Sodium: 143 mEq/L (ref 135–145)

## 2014-08-31 LAB — HEPATIC FUNCTION PANEL
ALT: 22 U/L (ref 0–53)
AST: 24 U/L (ref 0–37)
Albumin: 4.5 g/dL (ref 3.5–5.2)
Alkaline Phosphatase: 62 U/L (ref 39–117)
BILIRUBIN TOTAL: 0.7 mg/dL (ref 0.2–1.2)
Bilirubin, Direct: 0.1 mg/dL (ref 0.0–0.3)
Indirect Bilirubin: 0.6 mg/dL (ref 0.2–1.2)
Total Protein: 7.2 g/dL (ref 6.0–8.3)

## 2014-08-31 LAB — INSULIN, FASTING: Insulin fasting, serum: 23.4 u[IU]/mL — ABNORMAL HIGH (ref 2.0–19.6)

## 2014-08-31 LAB — LIPID PANEL
CHOL/HDL RATIO: 3.2 ratio
CHOLESTEROL: 183 mg/dL (ref 0–200)
HDL: 58 mg/dL (ref >39)
LDL Cholesterol: 102 mg/dL — ABNORMAL HIGH (ref 0–99)
Triglycerides: 115 mg/dL (ref <150)
VLDL: 23 mg/dL (ref 0–40)

## 2014-08-31 LAB — VITAMIN D 25 HYDROXY (VIT D DEFICIENCY, FRACTURES): VIT D 25 HYDROXY: 54 ng/mL (ref 30–100)

## 2014-08-31 LAB — TSH: TSH: 0.871 u[IU]/mL (ref 0.350–4.500)

## 2014-08-31 LAB — MAGNESIUM: MAGNESIUM: 1.8 mg/dL (ref 1.5–2.5)

## 2014-09-16 DIAGNOSIS — Z85828 Personal history of other malignant neoplasm of skin: Secondary | ICD-10-CM | POA: Diagnosis not present

## 2014-09-16 DIAGNOSIS — C44319 Basal cell carcinoma of skin of other parts of face: Secondary | ICD-10-CM | POA: Diagnosis not present

## 2014-11-21 ENCOUNTER — Encounter: Payer: Self-pay | Admitting: *Deleted

## 2014-12-07 ENCOUNTER — Ambulatory Visit (INDEPENDENT_AMBULATORY_CARE_PROVIDER_SITE_OTHER): Payer: Medicare Other | Admitting: Physician Assistant

## 2014-12-07 ENCOUNTER — Encounter: Payer: Self-pay | Admitting: Physician Assistant

## 2014-12-07 VITALS — BP 120/68 | HR 60 | Temp 97.9°F | Resp 16 | Ht 64.0 in | Wt 148.0 lb

## 2014-12-07 DIAGNOSIS — I10 Essential (primary) hypertension: Secondary | ICD-10-CM | POA: Diagnosis not present

## 2014-12-07 DIAGNOSIS — Z79899 Other long term (current) drug therapy: Secondary | ICD-10-CM

## 2014-12-07 DIAGNOSIS — E559 Vitamin D deficiency, unspecified: Secondary | ICD-10-CM

## 2014-12-07 DIAGNOSIS — R7309 Other abnormal glucose: Secondary | ICD-10-CM | POA: Diagnosis not present

## 2014-12-07 DIAGNOSIS — E782 Mixed hyperlipidemia: Secondary | ICD-10-CM

## 2014-12-07 DIAGNOSIS — R7303 Prediabetes: Secondary | ICD-10-CM

## 2014-12-07 LAB — CBC WITH DIFFERENTIAL/PLATELET
BASOS ABS: 0 10*3/uL (ref 0.0–0.1)
BASOS PCT: 0 % (ref 0–1)
Eosinophils Absolute: 0.1 10*3/uL (ref 0.0–0.7)
Eosinophils Relative: 1 % (ref 0–5)
HEMATOCRIT: 44.2 % (ref 39.0–52.0)
Hemoglobin: 14.7 g/dL (ref 13.0–17.0)
LYMPHS ABS: 1.4 10*3/uL (ref 0.7–4.0)
Lymphocytes Relative: 26 % (ref 12–46)
MCH: 32.5 pg (ref 26.0–34.0)
MCHC: 33.3 g/dL (ref 30.0–36.0)
MCV: 97.6 fL (ref 78.0–100.0)
MONOS PCT: 8 % (ref 3–12)
MPV: 10.5 fL (ref 8.6–12.4)
Monocytes Absolute: 0.4 10*3/uL (ref 0.1–1.0)
Neutro Abs: 3.4 10*3/uL (ref 1.7–7.7)
Neutrophils Relative %: 65 % (ref 43–77)
Platelets: 148 10*3/uL — ABNORMAL LOW (ref 150–400)
RBC: 4.53 MIL/uL (ref 4.22–5.81)
RDW: 13.1 % (ref 11.5–15.5)
WBC: 5.2 10*3/uL (ref 4.0–10.5)

## 2014-12-07 LAB — HEMOGLOBIN A1C
Hgb A1c MFr Bld: 6 % — ABNORMAL HIGH (ref <5.7)
Mean Plasma Glucose: 126 mg/dL — ABNORMAL HIGH (ref <117)

## 2014-12-07 NOTE — Patient Instructions (Signed)
Use a dropper or use a cap to put olive oil,mineral oil or canola oil in the effected ear- 2-3 times a week. Let it soak for 20-30 min then you can take a shower or use a baby bulb with warm water to wash out the ear wax.  Do not use Qtips     Bad carbs also include fruit juice, alcohol, and sweet tea. These are empty calories that do not signal to your brain that you are full.   Please remember the good carbs are still carbs which convert into sugar. So please measure them out no more than 1/2-1 cup of rice, oatmeal, pasta, and beans  Veggies are however free foods! Pile them on.   Not all fruit is created equal. Please see the list below, the fruit at the bottom is higher in sugars than the fruit at the top. Please avoid all dried fruits.     Recommendations For Diabetic/Prediabetic Patients:   -  Take medications as prescribed  -  Recommend Dr Fara Olden Fuhrman's book "The End of Diabetes "  And "The End of Dieting"- Can get at  www.[facility].com and encourage also get the Audio CD book  - AVOID Animal products, ie. Meat - red/white, Poultry and Dairy/especially cheese - Exercise at least 5 times a week for 30 minutes or preferably daily.  - No Smoking - Drink less than 2 drinks a day.  - Monitor your feet for sores - Have yearly Eye Exams - Recommend annual Flu vaccine  - Recommend Pneumovax and Prevnar vaccines - Shingles Vaccine (Zostavax) if over 60 y.o.  Goals:   - BMI less than 24 - Fasting sugar less than 130 or less than 150 if tapering medicines to lose weight  - Systolic BP less than 151  - Diastolic BP less than 80 - Bad LDL Cholesterol less than 70 - Triglycerides less than 150

## 2014-12-07 NOTE — Progress Notes (Signed)
. Assessment and Plan:  1. Hypertension -Continue medication, monitor blood pressure at home. Continue DASH diet.  Reminder to go to the ER if any CP, SOB, nausea, dizziness, severe HA, changes vision/speech, left arm numbness and tingling and jaw pain.  2. Cholesterol -Continue diet and exercise. Check cholesterol.   3. Prediabetes  -Continue diet and exercise. Check A1C  4. Vitamin D Def - check level and continue medications.   Continue diet and meds as discussed. Further disposition pending results of labs. Over 30 minutes of exam, counseling, chart review, and critical decision making was performed  HPI 81 y.o. male  presents for 3 month follow up on hypertension, cholesterol, prediabetes, and vitamin D deficiency.   His blood pressure has been controlled at home, today their BP is BP: 120/68 mmHg  He does workout. He denies chest pain, shortness of breath, dizziness.  He is on cholesterol medication and denies myalgias. His cholesterol is at goal. The cholesterol last visit was:   Lab Results  Component Value Date   CHOL 183 08/30/2014   HDL 58 08/30/2014   LDLCALC 102* 08/30/2014   TRIG 115 08/30/2014   CHOLHDL 3.2 08/30/2014   He has been working on diet and exercise for prediabetes, and denies paresthesia of the feet, polydipsia, polyuria and visual disturbances. Last A1C in the office was:  Lab Results  Component Value Date   HGBA1C 5.8* 08/30/2014  Patient is on Vitamin D supplement.   Lab Results  Component Value Date   VD25OH 54 08/30/2014      Current Medications:  Current Outpatient Prescriptions on File Prior to Visit  Medication Sig Dispense Refill  . Ascorbic Acid (VITAMIN C) 100 MG tablet Take 100 mg by mouth daily.    Marland Kitchen aspirin 81 MG tablet Take 81 mg by mouth daily.    . Cholecalciferol (VITAMIN D) 2000 UNITS tablet Take 2,000 Units by mouth daily. Takes 4000 per day    . Flaxseed, Linseed, (FLAXSEED OIL PO) Take by mouth daily.    . fluorouracil  (EFUDEX) 5 % cream Applies PRN    . furosemide (LASIX) 40 MG tablet Take 40 mg by mouth.    . Multiple Vitamin (MULTI VITAMIN DAILY PO) Take by mouth.    . Omega-3 Fatty Acids (FISH OIL PO) Take by mouth.    . pravastatin (PRAVACHOL) 40 MG tablet TAKE 1 TABLET AT BEDTIME FOR CHOLESTEROL 90 tablet 99   No current facility-administered medications on file prior to visit.   Medical History:  Past Medical History  Diagnosis Date  . Hypertension   . Hyperlipidemia   . Pre-diabetes   . Vitamin D deficiency   . Cancer     prostate   Allergies:  Allergies  Allergen Reactions  . Lipitor [Atorvastatin] Other (See Comments)    Severe NIV  . Niaspan [Niacin Er] Itching    Review of Systems:  Review of Systems  Constitutional: Negative.   HENT: Negative.   Eyes: Negative.   Respiratory: Negative.   Cardiovascular: Negative.   Gastrointestinal: Negative.   Genitourinary: Negative.   Musculoskeletal: Negative.   Skin: Negative.   Neurological: Negative.   Endo/Heme/Allergies: Negative.   Psychiatric/Behavioral: Negative.     Family history- Review and unchanged Social history- Review and unchanged Physical Exam: BP 120/68 mmHg  Pulse 60  Temp(Src) 97.9 F (36.6 C)  Resp 16  Ht 5\' 4"  (1.626 m)  Wt 148 lb (67.132 kg)  BMI 25.39 kg/m2 Wt Readings from Last 3  Encounters:  12/07/14 148 lb (67.132 kg)  08/30/14 147 lb 6.4 oz (66.86 kg)  05/21/14 145 lb (65.772 kg)   General Appearance: Well nourished, in no apparent distress. Eyes: PERRLA, EOMs, conjunctiva no swelling or erythema Sinuses: No Frontal/maxillary tenderness ENT/Mouth: Ext aud canals clear, TMs without erythema, bulging. No erythema, swelling, or exudate on post pharynx.  Tonsils not swollen or erythematous. Hearing normal.  Neck: Supple, thyroid normal.  Respiratory: Respiratory effort normal, distant lung sounds with mild expiratory wheeze due to kyphoscoliosis.  Cardio: RRR with no MRGs. Brisk peripheral  pulses without edema.  Abdomen: Soft, + BS,  Non tender, no guarding, rebound, hernias, masses. Lymphatics: Non tender without lymphadenopathy.  Musculoskeletal: Severe kyphoscoliosis, 4/5 strength, Normal gait Skin: Warm, dry without rashes, lesions, ecchymosis.  Neuro: Cranial nerves intact. Normal muscle tone, no cerebellar symptoms. Psych: Awake and oriented X 3, normal affect, Insight and Judgment appropriate.    Vicie Mutters, PA-C 9:53 AM [facility]

## 2014-12-08 LAB — LIPID PANEL
CHOLESTEROL: 150 mg/dL (ref 0–200)
HDL: 52 mg/dL (ref >=40)
LDL CALC: 80 mg/dL (ref 0–99)
Total CHOL/HDL Ratio: 2.9 Ratio
Triglycerides: 89 mg/dL (ref <150)
VLDL: 18 mg/dL (ref 0–40)

## 2014-12-08 LAB — BASIC METABOLIC PANEL WITH GFR
BUN: 22 mg/dL (ref 6–23)
CALCIUM: 8.7 mg/dL (ref 8.4–10.5)
CO2: 33 mEq/L — ABNORMAL HIGH (ref 19–32)
Chloride: 102 mEq/L (ref 96–112)
Creat: 0.82 mg/dL (ref 0.50–1.35)
GFR, EST NON AFRICAN AMERICAN: 82 mL/min
Glucose, Bld: 120 mg/dL — ABNORMAL HIGH (ref 70–99)
Potassium: 4.1 mEq/L (ref 3.5–5.3)
SODIUM: 142 meq/L (ref 135–145)

## 2014-12-08 LAB — HEPATIC FUNCTION PANEL
ALT: 21 U/L (ref 0–53)
AST: 22 U/L (ref 0–37)
Albumin: 4 g/dL (ref 3.5–5.2)
Alkaline Phosphatase: 51 U/L (ref 39–117)
Bilirubin, Direct: 0.1 mg/dL (ref 0.0–0.3)
Indirect Bilirubin: 0.5 mg/dL (ref 0.2–1.2)
Total Bilirubin: 0.6 mg/dL (ref 0.2–1.2)
Total Protein: 6 g/dL (ref 6.0–8.3)

## 2014-12-08 LAB — MAGNESIUM: Magnesium: 1.8 mg/dL (ref 1.5–2.5)

## 2014-12-08 LAB — TSH: TSH: 0.617 u[IU]/mL (ref 0.350–4.500)

## 2014-12-08 LAB — INSULIN, FASTING: Insulin fasting, serum: 56.5 u[IU]/mL — ABNORMAL HIGH (ref 2.0–19.6)

## 2015-01-04 ENCOUNTER — Encounter: Payer: Self-pay | Admitting: Physician Assistant

## 2015-01-04 ENCOUNTER — Ambulatory Visit (INDEPENDENT_AMBULATORY_CARE_PROVIDER_SITE_OTHER): Payer: Medicare Other | Admitting: Physician Assistant

## 2015-01-04 VITALS — BP 122/68 | HR 72 | Temp 98.1°F | Resp 16 | Ht 64.0 in | Wt 143.0 lb

## 2015-01-04 DIAGNOSIS — J209 Acute bronchitis, unspecified: Secondary | ICD-10-CM | POA: Diagnosis not present

## 2015-01-04 DIAGNOSIS — J984 Other disorders of lung: Secondary | ICD-10-CM | POA: Diagnosis not present

## 2015-01-04 DIAGNOSIS — M419 Scoliosis, unspecified: Secondary | ICD-10-CM

## 2015-01-04 MED ORDER — PROMETHAZINE-CODEINE 6.25-10 MG/5ML PO SYRP: 5.0000 mL | ORAL_SOLUTION | Freq: Four times a day (QID) | ORAL | Status: DC | PRN

## 2015-01-04 MED ORDER — AZITHROMYCIN 250 MG PO TABS: ORAL_TABLET | ORAL | Status: AC

## 2015-01-04 MED ORDER — ALBUTEROL SULFATE 108 (90 BASE) MCG/ACT IN AEPB: 2.0000 | INHALATION_SPRAY | Freq: Four times a day (QID) | RESPIRATORY_TRACT | Status: DC | PRN

## 2015-01-04 MED ORDER — PREDNISONE 20 MG PO TABS: ORAL_TABLET | ORAL | Status: DC

## 2015-01-04 NOTE — Patient Instructions (Signed)
I will give you a prescription for an antibiotic, but please only take it if you are not feeling better in 7-10 days.  Bronchitis    Can take the prednisone AT NIGHT WITH DINNER, it take 8-12 hours to start working so it will NOT affect your sleeping if you take it at night with your food!! Take two pills the first night and 1 or two pill the second night and then 1 pill the other nights.    Rest and stay hydrated.  Make sure you drink plenty of fluids to make sure urine is clear when you urinate.  Water will help thin out mucous. - Take Mucinex DM- Maximum Strength over the counter to thin out and cough up the thick mucous.  Please follow directions on box. -Take Albuterol if prescribed.  Please call the office or message through My Chart if you have any questions.   Acute Bronchitis Bronchitis is when the airways that extend from the windpipe into the lungs get red, puffy, and painful (inflamed). Bronchitis often causes thick spit (mucus) to develop. This leads to a cough. A cough is the most common symptom of bronchitis. In acute bronchitis, the condition usually begins suddenly and goes away over time (usually in 2 weeks). Smoking, allergies, and asthma can make bronchitis worse. Repeated episodes of bronchitis may cause more lung problems.  Most common cause of Bronchitis is viruses (rhinovirus, coronavirus, RSV).  Therefore, not requiring an antibiotic; as antibiotics only treat bacterial infections.  HOME CARE  Rest.  Drink enough fluids to keep your pee (urine) clear or pale yellow (unless you need to limit fluids as told by your doctor).  Only take over-the-counter or prescription medicines as told by your doctor.  Avoid smoking and secondhand smoke. These can make bronchitis worse. If you are a smoker, think about using nicotine gum or skin patches. Quitting smoking will help your lungs heal faster.  Reduce the chance of getting bronchitis again by:  Washing your hands  often.  Avoiding people with cold symptoms.  Trying not to touch your hands to your mouth, nose, or eyes.  Follow up with your doctor as told. GET HELP IF: Your symptoms do not improve after 1 week of treatment. Symptoms include:  Cough.  Fever.  Coughing up thick spit.  Body aches.  Chest congestion.  Chills.  Shortness of breath.  Sore throat. GET HELP RIGHT AWAY IF:   You have an increased fever.  You have chills.  You have severe shortness of breath.  You have bloody thick spit (sputum).  You throw up (vomit) often.  You lose too much body fluid (dehydration).  You have a severe headache.  You faint. MAKE SURE YOU:   Understand these instructions.  Will watch your condition.  Will get help right away if you are not doing well or get worse. Document Released: 02/06/2008 Document Revised: 04/22/2013 Document Reviewed: 02/10/2013 [facility] Patient Information 2015 [place], [place]. This information is not intended to replace advice given to you by your health care provider. Make sure you discuss any questions you have with your health care provider.

## 2015-01-04 NOTE — Progress Notes (Signed)
Subjective:    Patient ID: Timothy Vaughn, male    DOB: 1929-04-06, 81 y.o.   MRN: [id number]  HPI 81 y.o. male with history of HTN, preDM, Chol, restrictive lung disease due to kyphoscoliosis presents with cough. He has had cough since yesterday. He has occ productive dark yellow mucus with cough, wheezing and SOB, has sinus drainage. Denies Sinus congestion, itchy watery eyes, red eyes, ear fullness, ear pain, difficulty swallowing , chest tightness and chest pain, fever. Denies edema, weight gain, PND, orthopnea.  Has not tried any OTC meds.   Blood pressure 122/68, pulse 72, temperature 98.1 F (36.7 C), resp. rate 16, height 5\' 4"  (1.626 m), weight 143 lb (64.864 kg).  Current Outpatient Prescriptions on File Prior to Visit  Medication Sig Dispense Refill  . Ascorbic Acid (VITAMIN C) 100 MG tablet Take 100 mg by mouth daily.    Marland Kitchen aspirin 81 MG tablet Take 81 mg by mouth daily.    . Flaxseed, Linseed, (FLAXSEED OIL PO) Take by mouth daily.    . fluorouracil (EFUDEX) 5 % cream Applies PRN    . furosemide (LASIX) 40 MG tablet Take 40 mg by mouth.    . Multiple Vitamin (MULTI VITAMIN DAILY PO) Take by mouth.    . Omega-3 Fatty Acids (FISH OIL PO) Take by mouth.    . pravastatin (PRAVACHOL) 40 MG tablet TAKE 1 TABLET AT BEDTIME FOR CHOLESTEROL 90 tablet 99   No current facility-administered medications on file prior to visit.   Past Medical History  Diagnosis Date  . Hypertension   . Hyperlipidemia   . Pre-diabetes   . Vitamin D deficiency   . Cancer     prostate    Review of Systems  Constitutional: Negative for fever and chills.  HENT: Positive for postnasal drip, rhinorrhea and sore throat. Negative for congestion, ear discharge, ear pain, hearing loss, tinnitus, trouble swallowing and voice change.   Respiratory: Positive for cough, shortness of breath and wheezing. Negative for chest tightness.   Cardiovascular: Negative for chest pain, palpitations and leg swelling.        Denies PND, edema, weight gain.   Gastrointestinal: Negative.   Genitourinary: Negative.   Musculoskeletal: Negative for myalgias.  Skin: Negative for rash.  Neurological: Negative.  Negative for headaches.       Objective:   Physical Exam  Constitutional: He is oriented to person, place, and time. He appears well-developed and well-nourished.  HENT:  Head: Normocephalic and atraumatic.  Right Ear: External ear normal.  Left Ear: External ear normal.  Nose: Right sinus exhibits no maxillary sinus tenderness and no frontal sinus tenderness. Left sinus exhibits no maxillary sinus tenderness and no frontal sinus tenderness.  Mouth/Throat: Oropharynx is clear and moist.  Eyes: Conjunctivae are normal. Pupils are equal, round, and reactive to light.  Neck: Normal range of motion. Neck supple.  Cardiovascular: Normal rate, regular rhythm and normal heart sounds.   Occasional extrasystoles are present.  No murmur heard. Pulmonary/Chest: Effort normal. No respiratory distress. He has decreased breath sounds (due to kyphoscoliosis). He has wheezes (diffuse and worse RLL). He has no rales. He exhibits no tenderness.  Abdominal: Soft. Bowel sounds are normal.  Musculoskeletal:  Severe kyphoscoliosis  Lymphadenopathy:    He has no cervical adenopathy.  Neurological: He is alert and oriented to person, place, and time.  Skin: Skin is warm and dry.       Assessment & Plan:  1.Acute bronchitis, unspecified organism complicated by  Restrictive lung disease due to kyphoscoliosis Will treat due to restrictive lung disease, go to the ER if worsening symptoms, call Monday if not improved.  - azithromycin (ZITHROMAX) 250 MG tablet; Take 2 tablets (500 mg) on  Day 1,  followed by 1 tablet (250 mg) once daily on Days 2 through 5.  Dispense: 6 each; Refill: 1 - predniSONE (DELTASONE) 20 MG tablet; 2 tablets daily for 3 days, 1 tablet daily for 4 days.  Dispense: 10 tablet; Refill: 0 -  promethazine-codeine (PHENERGAN WITH CODEINE) 6.25-10 MG/5ML syrup; Take 5 mLs by mouth every 6 (six) hours as needed for cough. Max: 42mL per day  Dispense: 240 mL; Refill: 0 - Albuterol Sulfate (PROAIR RESPICLICK) 224 (90 BASE) MCG/ACT AEPB; Inhale 2 Act into the lungs every 6 (six) hours as needed (for shortness of breath).  Dispense: 1 each; Refill: 0

## 2015-01-12 ENCOUNTER — Encounter: Payer: Self-pay | Admitting: Internal Medicine

## 2015-01-12 ENCOUNTER — Ambulatory Visit (INDEPENDENT_AMBULATORY_CARE_PROVIDER_SITE_OTHER): Payer: Medicare Other | Admitting: Internal Medicine

## 2015-01-12 VITALS — BP 126/66 | HR 72 | Temp 97.9°F | Resp 16 | Ht 64.0 in | Wt 148.8 lb

## 2015-01-12 DIAGNOSIS — J984 Other disorders of lung: Secondary | ICD-10-CM

## 2015-01-12 DIAGNOSIS — J209 Acute bronchitis, unspecified: Secondary | ICD-10-CM | POA: Diagnosis not present

## 2015-01-12 DIAGNOSIS — M419 Scoliosis, unspecified: Secondary | ICD-10-CM | POA: Diagnosis not present

## 2015-01-12 MED ORDER — PROMETHAZINE-CODEINE 6.25-10 MG/5ML PO SYRP: 5.0000 mL | ORAL_SOLUTION | Freq: Four times a day (QID) | ORAL | Status: DC | PRN

## 2015-01-12 MED ORDER — LEVOFLOXACIN 500 MG PO TABS: ORAL_TABLET | ORAL | Status: DC

## 2015-01-12 MED ORDER — PREDNISONE 20 MG PO TABS: ORAL_TABLET | ORAL | Status: DC

## 2015-01-12 NOTE — Progress Notes (Signed)
Subjective:    Patient ID: Timothy Vaughn, male    DOB: [birth year], 81 y.o.   MRN: [id number]  HPI Patient is a very nice 81 yo MWM with a severe restrictive lung Dz due to rotokyphoscoliosis. He presents now with a 3-4 da hx/o productive cough of a mucopurulent sputum. Denies fever, chills, SOB, or rashes. Taking fluids OK. He had been treated about a week ago with a Zpak w/o any improvement and in fact states he feels worse.   Medication Sig  . Albuterol Sulfate (PROAIR RESPICLICK) 409 (90 BASE) MCG/ACT AEPB Inhale 2 Act into the lungs every 6 (six) hours as needed (for shortness of breath).  . Ascorbic Acid (VITAMIN C) 100 MG tablet Take 100 mg by mouth daily.  Marland Kitchen aspirin 81 MG tablet Take 81 mg by mouth daily.  . Cholecalciferol (VITAMIN D PO) Take 6,000 Int'l Units by mouth daily.  . Flaxseed, Linseed, (FLAXSEED OIL PO) Take by mouth daily.  . fluorouracil (EFUDEX) 5 % cream Applies PRN  . furosemide (LASIX) 40 MG tablet Take 40 mg by mouth.  . Multiple Vitamin (MULTI VITAMIN DAILY PO) Take by mouth.  . Omega-3 Fatty Acids (FISH OIL PO) Take by mouth.  . pravastatin (PRAVACHOL) 40 MG tablet TAKE 1 TABLET AT BEDTIME FOR CHOLESTEROL  . promethazine-codeine (PHENERGAN WITH CODEINE) 6.25-10 MG/5ML syrup Take 5 mLs by mouth every 6 (six) hours as needed for cough. Max: 87mL per day   Allergies  Allergen Reactions  . Lipitor [Atorvastatin] Other (See Comments)    Severe NIV  . Niaspan [Niacin Er] Itching   Past Medical History  Diagnosis Date  . Hypertension   . Hyperlipidemia   . Pre-diabetes   . Vitamin D deficiency   . Cancer     prostate   Review of Systems 10 point systems review negative except as above.    Objective:   Physical Exam  BP 126/66 mmHg  Pulse 72  Temp(Src) 97.9 F (36.6 C)  Resp 16  Ht 5\' 4"  (1.626 m)  Wt 148 lb 12.8 oz (67.495 kg)  BMI 25.53 kg/m2  In No Respiratory distress with a congested cough. O2 sat = 97% on rm air.   HEENT - Eac's  patent. TM's Nl. EOM's full. PERRLA. NasoOroPharynx clear. Neck - supple. Nl Thyroid. Carotids 2+ & No bruits, nodes, JVD Chest - BS decreased w/scattered coaese Rales & rhonchi - No wheezes.  Cor - Nl HS. RRR w/o sig MGR. PP 1(+). No edema. Abd - No palpable organomegaly, masses or tenderness. BS nl. MS- FROM w/o deformities. Muscle power, tone and bulk Nl. Gait Nl. Neuro - No obvious Cr N abnormalities. Sensory, motor and Cerebellar functions appear Nl w/o focal abnormalities. Psyche - Mental status normal & appropriate.  No delusions, ideations or obvious mood abnormalities.     Assessment & Plan:   1. Acute bronchitis, unspecified organism  - levofloxacin (LEVAQUIN) 500 MG tablet; Take 1 tablet daily with food for infection  Dispense: 15 tablet; Refill: 1 - predniSONE (DELTASONE) 20 MG tablet; 1 tab 3 x day for 3 days, then 1 tab 2 x day for 3 days, then 1 tab 1 x day for 5 days  Dispense: 20 tablet; Refill: 0 - promethazine-codeine (PHENERGAN WITH CODEINE) 6.25-10 MG/5ML syrup; Take 5 mLs by mouth every 6 (six) hours as needed for cough. Max: 57mL per day  Dispense: 240 mL; Refill: 0  2. Restrictive lung disease due to kyphoscoliosis

## 2015-01-24 ENCOUNTER — Ambulatory Visit (INDEPENDENT_AMBULATORY_CARE_PROVIDER_SITE_OTHER): Payer: Medicare Other | Admitting: Internal Medicine

## 2015-01-24 ENCOUNTER — Encounter: Payer: Self-pay | Admitting: Internal Medicine

## 2015-01-24 VITALS — BP 106/58 | HR 68 | Temp 98.2°F | Resp 16 | Ht 64.0 in | Wt 147.4 lb

## 2015-01-24 DIAGNOSIS — R110 Nausea: Secondary | ICD-10-CM | POA: Diagnosis not present

## 2015-01-24 DIAGNOSIS — G4700 Insomnia, unspecified: Secondary | ICD-10-CM | POA: Diagnosis not present

## 2015-01-24 MED ORDER — ONDANSETRON HCL 4 MG PO TABS: 4.0000 mg | ORAL_TABLET | Freq: Every day | ORAL | Status: DC | PRN

## 2015-01-24 MED ORDER — TRAZODONE HCL 50 MG PO TABS: 50.0000 mg | ORAL_TABLET | Freq: Every day | ORAL | Status: DC

## 2015-01-24 NOTE — Patient Instructions (Signed)
Trazodone tablets What is this medicine? TRAZODONE (TRAZ oh done) is used to treat depression. This medicine may be used for other purposes; ask your health care provider or pharmacist if you have questions. COMMON BRAND NAME(S): Desyrel What should I tell my health care provider before I take this medicine? They need to know if you have any of these conditions: -attempted suicide or thinking about it -bipolar disorder -bleeding problems -glaucoma -heart disease, or previous heart attack -irregular heart beat -kidney or liver disease -low levels of sodium in the blood -an unusual or allergic reaction to trazodone, other medicines, foods, dyes or preservatives -pregnant or trying to get pregnant -breast-feeding How should I use this medicine? Take this medicine by mouth with a glass of water. Follow the directions on the prescription label. Take this medicine shortly after a meal or a light snack. Take your medicine at regular intervals. Do not take your medicine more often than directed. Do not stop taking this medicine suddenly except upon the advice of your doctor. Stopping this medicine too quickly may cause serious side effects or your condition may worsen. A special MedGuide will be given to you by the pharmacist with each prescription and refill. Be sure to read this information carefully each time. Talk to your pediatrician regarding the use of this medicine in children. Special care may be needed. Overdosage: If you think you have taken too much of this medicine contact a poison control center or emergency room at once. NOTE: This medicine is only for you. Do not share this medicine with others. What if I miss a dose? If you miss a dose, take it as soon as you can. If it is almost time for your next dose, take only that dose. Do not take double or extra doses. What may interact with this medicine? Do not take this medicine with any of the following medications: -certain medicines  for fungal infections like fluconazole, itraconazole, ketoconazole, posaconazole, voriconazole -cisapride -dofetilide -dronedarone -linezolid -MAOIs like Carbex, Eldepryl, Marplan, Nardil, and Parnate -mesoridazine -methylene blue (injected into a vein) -pimozide -saquinavir -thioridazine -ziprasidone This medicine may also interact with the following medications: -alcohol -antiviral medicines for HIV or AIDS -aspirin and aspirin-like medicines -barbiturates like phenobarbital -certain medicines for blood pressure, heart disease, irregular heart beat -certain medicines for depression, anxiety, or psychotic disturbances -certain medicines for migraine headache like almotriptan, eletriptan, frovatriptan, naratriptan, rizatriptan, sumatriptan, zolmitriptan -certain medicines for seizures like carbamazepine and phenytoin -certain medicines for sleep -certain medicines that treat or prevent blood clots like dalteparin, enoxaparin, warfarin -digoxin -fentanyl -lithium -NSAIDS, medicines for pain and inflammation, like ibuprofen or naproxen -other medicines that prolong the QT interval (cause an abnormal heart rhythm) -rasagiline -supplements like St. John's wort, kava kava, valerian -tramadol -tryptophan This list may not describe all possible interactions. Give your health care provider a list of all the medicines, herbs, non-prescription drugs, or dietary supplements you use. Also tell them if you smoke, drink alcohol, or use illegal drugs. Some items may interact with your medicine. What should I watch for while using this medicine? Tell your doctor if your symptoms do not get better or if they get worse. Visit your doctor or health care professional for regular checks on your progress. Because it may take several weeks to see the full effects of this medicine, it is important to continue your treatment as prescribed by your doctor. Patients and their families should watch out for new  or worsening thoughts of suicide or depression. Also   watch out for sudden changes in feelings such as feeling anxious, agitated, panicky, irritable, hostile, aggressive, impulsive, severely restless, overly excited and hyperactive, or not being able to sleep. If this happens, especially at the beginning of treatment or after a change in dose, call your health care professional. Dennis Bast may get drowsy or dizzy. Do not drive, use machinery, or do anything that needs mental alertness until you know how this medicine affects you. Do not stand or sit up quickly, especially if you are an older patient. This reduces the risk of dizzy or fainting spells. Alcohol may interfere with the effect of this medicine. Avoid alcoholic drinks. This medicine may cause dry eyes and blurred vision. If you wear contact lenses you may feel some discomfort. Lubricating drops may help. See your eye doctor if the problem does not go away or is severe. Your mouth may get dry. Chewing sugarless gum, sucking hard candy and drinking plenty of water may help. Contact your doctor if the problem does not go away or is severe. What side effects may I notice from receiving this medicine? Side effects that you should report to your doctor or health care professional as soon as possible: -allergic reactions like skin rash, itching or hives, swelling of the face, lips, or tongue -fast, irregular heartbeat -feeling faint or lightheaded, falls -painful erections or other sexual dysfunction -suicidal thoughts or other mood changes -trembling Side effects that usually do not require medical attention (report to your doctor or health care professional if they continue or are bothersome): -constipation -headache -muscle aches or pains -nausea, vomiting -unusually weak or tired This list may not describe all possible side effects. Call your doctor for medical advice about side effects. You may report side effects to FDA at 1-800-FDA-1088. Where  should I keep my medicine? Keep out of the reach of children. Store at room temperature between 15 and 30 degrees C (59 to 86 degrees F). Protect from light. Keep container tightly closed. Throw away any unused medicine after the expiration date. NOTE: This sheet is a summary. It may not cover all possible information. If you have questions about this medicine, talk to your doctor, pharmacist, or health care provider.  2015, Elsevier/Gold Standard. (2013-03-23 15:46:28)  Gastritis, Adult Gastritis is soreness and swelling (inflammation) of the lining of the stomach. Gastritis can develop as a sudden onset (acute) or long-term (chronic) condition. If gastritis is not treated, it can lead to stomach bleeding and ulcers. CAUSES  Gastritis occurs when the stomach lining is weak or damaged. Digestive juices from the stomach then inflame the weakened stomach lining. The stomach lining may be weak or damaged due to viral or bacterial infections. One common bacterial infection is the Helicobacter pylori infection. Gastritis can also result from excessive alcohol consumption, taking certain medicines, or having too much acid in the stomach.  SYMPTOMS  In some cases, there are no symptoms. When symptoms are present, they may include:  Pain or a burning sensation in the upper abdomen.  Nausea.  Vomiting.  An uncomfortable feeling of fullness after eating. DIAGNOSIS  Your caregiver may suspect you have gastritis based on your symptoms and a physical exam. To determine the cause of your gastritis, your caregiver may perform the following:  Blood or stool tests to check for the H pylori bacterium.  Gastroscopy. A thin, flexible tube (endoscope) is passed down the esophagus and into the stomach. The endoscope has a light and camera on the end. Your caregiver uses the endoscope  to view the inside of the stomach.  Taking a tissue sample (biopsy) from the stomach to examine under a microscope. TREATMENT    Depending on the cause of your gastritis, medicines may be prescribed. If you have a bacterial infection, such as an H pylori infection, antibiotics may be given. If your gastritis is caused by too much acid in the stomach, H2 blockers or antacids may be given. Your caregiver may recommend that you stop taking aspirin, ibuprofen, or other nonsteroidal anti-inflammatory drugs (NSAIDs). HOME CARE INSTRUCTIONS  Only take over-the-counter or prescription medicines as directed by your caregiver.  If you were given antibiotic medicines, take them as directed. Finish them even if you start to feel better.  Drink enough fluids to keep your urine clear or pale yellow.  Avoid foods and drinks that make your symptoms worse, such as:  Caffeine or alcoholic drinks.  Chocolate.  Peppermint or mint flavorings.  Garlic and onions.  Spicy foods.  Citrus fruits, such as oranges, lemons, or limes.  Tomato-based foods such as sauce, chili, salsa, and pizza.  Fried and fatty foods.  Eat small, frequent meals instead of large meals. SEEK IMMEDIATE MEDICAL CARE IF:   You have black or dark red stools.  You vomit blood or material that looks like coffee grounds.  You are unable to keep fluids down.  Your abdominal pain gets worse.  You have a fever.  You do not feel better after 1 week.  You have any other questions or concerns. MAKE SURE YOU:  Understand these instructions.  Will watch your condition.  Will get help right away if you are not doing well or get worse. Document Released: 08/14/2001 Document Revised: 02/19/2012 Document Reviewed: 10/03/2011 [facility] Patient Information 2015 [place], [place]. This information is not intended to replace advice given to you by your health care provider. Make sure you discuss any questions you have with your health care provider.

## 2015-01-24 NOTE — Progress Notes (Signed)
   Subjective:    Patient ID: Timothy Vaughn, male    DOB: 1929/04/26, 81 y.o.   MRN: [id number]  HPI  Patient reports to the office for evaluation of nausea, congestion, and sore throat.  Patient was on a course of levaquin for treatment of an acute URI.  He reports that he finished the medication on Friday and then over the weekend he had some upset stomach and churning and some nausea.  He reports that he is feeling mildly better now.  He has not taken any medications to help settle his stomach either.  He does report that he has been also falling asleep as soon as he sits down.  He reports that he does not sleep well during the night.  He reports that he will be up for 1.5-2 hour segments.  He reports that cough is better.  Nasal drainage is improving.  Sore throat is better.     Review of Systems  Constitutional: Positive for fever. Negative for chills and fatigue.  HENT: Positive for postnasal drip and rhinorrhea. Negative for congestion, sinus pressure, sore throat and trouble swallowing.   Respiratory: Negative for cough, chest tightness and shortness of breath.   Cardiovascular: Negative for chest pain and palpitations.  Gastrointestinal: Positive for nausea. Negative for vomiting, diarrhea and constipation.  Genitourinary: Negative for dysuria, urgency, frequency, hematuria and difficulty urinating.  Neurological: Negative for dizziness and light-headedness.       Objective:   Physical Exam  Constitutional: He is oriented to person, place, and time. He appears well-developed and well-nourished. No distress.  HENT:  Head: Normocephalic and atraumatic.  Mouth/Throat: Oropharynx is clear and moist. No oropharyngeal exudate.  Eyes: Conjunctivae are normal. No scleral icterus.  Neck: Normal range of motion. Neck supple. No JVD present. No thyromegaly present.  Cardiovascular: Normal rate, regular rhythm, normal heart sounds and intact distal pulses.  Exam reveals no gallop and no  friction rub.   No murmur heard. Pulmonary/Chest: Effort normal and breath sounds normal. No respiratory distress. He has no wheezes. He has no rales. He exhibits no tenderness.  Abdominal: Soft. Bowel sounds are normal. He exhibits no distension and no mass. There is no tenderness. There is no rebound and no guarding.  Musculoskeletal: Normal range of motion.  Lymphadenopathy:    He has no cervical adenopathy.  Neurological: He is alert and oriented to person, place, and time.  Skin: Skin is warm and dry. He is not diaphoretic.  Psychiatric: He has a normal mood and affect. His behavior is normal. Judgment and thought content normal.  Nursing note and vitals reviewed.         Assessment & Plan:    1. Insomnia -Excessive Daytime sleepiness -likely due to nighttime insomnia per patient history - traZODone (DESYREL) 50 MG tablet; Take 1 tablet (50 mg total) by mouth at bedtime.  Dispense: 30 tablet; Refill: 0  2. Nausea without vomiting -nasal drainage vs. Recent abx and prednisone use. -exam benign -if continues consider blood work and further eval - ondansetron (ZOFRAN) 4 MG tablet; Take 1 tablet (4 mg total) by mouth daily as needed for nausea or vomiting.  Dispense: 30 tablet; Refill: 1

## 2015-02-09 DIAGNOSIS — L570 Actinic keratosis: Secondary | ICD-10-CM | POA: Diagnosis not present

## 2015-02-09 DIAGNOSIS — L821 Other seborrheic keratosis: Secondary | ICD-10-CM | POA: Diagnosis not present

## 2015-02-09 DIAGNOSIS — D2271 Melanocytic nevi of right lower limb, including hip: Secondary | ICD-10-CM | POA: Diagnosis not present

## 2015-02-09 DIAGNOSIS — D2261 Melanocytic nevi of right upper limb, including shoulder: Secondary | ICD-10-CM | POA: Diagnosis not present

## 2015-02-09 DIAGNOSIS — Z85828 Personal history of other malignant neoplasm of skin: Secondary | ICD-10-CM | POA: Diagnosis not present

## 2015-02-09 DIAGNOSIS — D1801 Hemangioma of skin and subcutaneous tissue: Secondary | ICD-10-CM | POA: Diagnosis not present

## 2015-02-16 ENCOUNTER — Encounter: Payer: Self-pay | Admitting: Internal Medicine

## 2015-04-12 ENCOUNTER — Other Ambulatory Visit: Payer: Self-pay | Admitting: Internal Medicine

## 2015-04-12 ENCOUNTER — Ambulatory Visit (INDEPENDENT_AMBULATORY_CARE_PROVIDER_SITE_OTHER): Payer: Medicare Other | Admitting: Internal Medicine

## 2015-04-12 ENCOUNTER — Encounter: Payer: Self-pay | Admitting: Internal Medicine

## 2015-04-12 VITALS — BP 116/78 | HR 64 | Temp 97.5°F | Resp 16 | Ht 64.0 in | Wt 144.4 lb

## 2015-04-12 DIAGNOSIS — E559 Vitamin D deficiency, unspecified: Secondary | ICD-10-CM | POA: Diagnosis not present

## 2015-04-12 DIAGNOSIS — R7303 Prediabetes: Secondary | ICD-10-CM

## 2015-04-12 DIAGNOSIS — Z1212 Encounter for screening for malignant neoplasm of rectum: Secondary | ICD-10-CM | POA: Diagnosis not present

## 2015-04-12 DIAGNOSIS — Z1331 Encounter for screening for depression: Secondary | ICD-10-CM

## 2015-04-12 DIAGNOSIS — Z789 Other specified health status: Secondary | ICD-10-CM | POA: Diagnosis not present

## 2015-04-12 DIAGNOSIS — Z125 Encounter for screening for malignant neoplasm of prostate: Secondary | ICD-10-CM | POA: Diagnosis not present

## 2015-04-12 DIAGNOSIS — R972 Elevated prostate specific antigen [PSA]: Secondary | ICD-10-CM

## 2015-04-12 DIAGNOSIS — R7309 Other abnormal glucose: Secondary | ICD-10-CM

## 2015-04-12 DIAGNOSIS — Z1389 Encounter for screening for other disorder: Secondary | ICD-10-CM

## 2015-04-12 DIAGNOSIS — Z79899 Other long term (current) drug therapy: Secondary | ICD-10-CM | POA: Diagnosis not present

## 2015-04-12 DIAGNOSIS — E782 Mixed hyperlipidemia: Secondary | ICD-10-CM | POA: Diagnosis not present

## 2015-04-12 DIAGNOSIS — Z6824 Body mass index (BMI) 24.0-24.9, adult: Secondary | ICD-10-CM

## 2015-04-12 DIAGNOSIS — I10 Essential (primary) hypertension: Secondary | ICD-10-CM

## 2015-04-12 DIAGNOSIS — Z0000 Encounter for general adult medical examination without abnormal findings: Secondary | ICD-10-CM | POA: Insufficient documentation

## 2015-04-12 DIAGNOSIS — G4700 Insomnia, unspecified: Secondary | ICD-10-CM

## 2015-04-12 DIAGNOSIS — Z9181 History of falling: Secondary | ICD-10-CM

## 2015-04-12 LAB — CBC WITH DIFFERENTIAL/PLATELET
BASOS ABS: 0 10*3/uL (ref 0.0–0.1)
Basophils Relative: 0 % (ref 0–1)
Eosinophils Absolute: 0.1 10*3/uL (ref 0.0–0.7)
Eosinophils Relative: 2 % (ref 0–5)
HCT: 43 % (ref 39.0–52.0)
Hemoglobin: 14.5 g/dL (ref 13.0–17.0)
Lymphocytes Relative: 28 % (ref 12–46)
Lymphs Abs: 1.8 10*3/uL (ref 0.7–4.0)
MCH: 32.5 pg (ref 26.0–34.0)
MCHC: 33.7 g/dL (ref 30.0–36.0)
MCV: 96.4 fL (ref 78.0–100.0)
MONO ABS: 0.6 10*3/uL (ref 0.1–1.0)
MPV: 10.2 fL (ref 8.6–12.4)
Monocytes Relative: 9 % (ref 3–12)
NEUTROS ABS: 3.8 10*3/uL (ref 1.7–7.7)
NEUTROS PCT: 61 % (ref 43–77)
PLATELETS: 173 10*3/uL (ref 150–400)
RBC: 4.46 MIL/uL (ref 4.22–5.81)
RDW: 13.6 % (ref 11.5–15.5)
WBC: 6.3 10*3/uL (ref 4.0–10.5)

## 2015-04-12 LAB — HEMOGLOBIN A1C
Hgb A1c MFr Bld: 6 % — ABNORMAL HIGH (ref <5.7)
Mean Plasma Glucose: 126 mg/dL — ABNORMAL HIGH (ref <117)

## 2015-04-12 LAB — TSH: TSH: 1.011 u[IU]/mL (ref 0.350–4.500)

## 2015-04-12 MED ORDER — TRAZODONE HCL 150 MG PO TABS: ORAL_TABLET | ORAL | Status: DC

## 2015-04-12 NOTE — Patient Instructions (Signed)

## 2015-04-12 NOTE — Progress Notes (Signed)
Patient ID: CAROLINE MATTERS, male   DOB: 31-Jan-1929, 81 y.o.   MRN: [id number]   Comprehensive Examination     This very nice 81 y.o. MWM presents for complete physical.  Patient has been followed for HTN, Prediabetes, Hyperlipidemia, and Vitamin D Deficiency. Patient has severe thoracolumbar kyphoscoliosis and consequent restrictive lung dz and has had consequent tendency to lower respiratory infections.      HTN predates since 2004. Today's BP: 116/78 mmHg. Pt's BP has been controlled with his diuretic, but as he has limited his diuretic to 4 x week as it limits his activities, he has developed some dependent edema.  Patient denies any cardiac symptoms as chest pain, palpitations, shortness of breath or dizziness.     Patient's hyperlipidemia is controlled with diet and medications. Patient denies myalgias or other medication SE's. Last lipids were at goal -  Cholesterol 150; HDL 52; LDL 80; Triglycerides 89 on 12/07/2014.     Patient has prediabetes since 2012 with A1c 5.8% and patient denies reactive hypoglycemic symptoms, visual blurring, diabetic polys or paresthesias. Last A1c was  6.0% on 12/07/2014.       Finally, patient has history of Vitamin D Deficiency of  35 in 2008 and last vitamin D was  54 on 08/30/2014.   Medication Sig  . Albuterol / PROAIR RESPICLICK Inhale 2 Act into the lungs every 6 (six) hours as needed (for shortness of breath).  . Ascorbic Acid (VITAMIN C) 100 MG tablet Take 100 mg by mouth daily.  Marland Kitchen aspirin 81 MG tablet Take 81 mg by mouth daily.  . Cholecalciferol (VITAMIN D PO) Take 6,000 Int'l Units by mouth daily.  . Flaxseed, Linseed, (FLAXSEED OIL PO) Take by mouth daily.  . fluorouracil (EFUDEX) 5 % cream Applies PRN  . furosemide (LASIX) 40 MG tablet Take 40 mg by mouth.  . Multiple Vitamin (MULTI VITAMIN DAILY PO) Take by mouth.  . Omega-3 Fatty Acids (FISH OIL PO) Take by mouth.  . pravastatin (PRAVACHOL) 40 MG tablet TAKE 1 TABLET AT BEDTIME FOR CHOLESTEROL   . levofloxacin (LEVAQUIN) 500 MG tablet Take 1 tablet daily with food for infection  . ondansetron (ZOFRAN) 4 MG tablet Take 1 tablet (4 mg total) by mouth daily as needed for nausea or vomiting.  . traZODone (DESYREL) 50 MG tablet Take 1 tablet (50 mg total) by mouth at bedtime.   Allergies  Allergen Reactions  . Lipitor [Atorvastatin] Other (See Comments)    Severe NIV  . Niaspan [Niacin Er] Itching   Past Medical History  Diagnosis Date  . Hypertension   . Hyperlipidemia   . Pre-diabetes   . Vitamin D deficiency   . Cancer     prostate   Health Maintenance  Topic Date Due  . TETANUS/TDAP  08/28/1950  . PNA vac Low Risk Adult (2 of 2 - PCV13) 02/04/2013  . INFLUENZA VACCINE  04/04/2015  . COLONOSCOPY  12/03/2015  . ZOSTAVAX  Completed   Immunization History  Administered Date(s) Administered  . DTaP 09/03/2005  . Influenza Whole 05/20/2013  . Influenza, High Dose Seasonal PF 06/04/2014  . Pneumococcal Polysaccharide-23 02/05/2012  . Zoster 09/25/2005   Past Surgical History  Procedure Laterality Date  . Prostatectomy  2001   Family History  Problem Relation Age of Onset  . Heart disease Mother   . COPD Mother   . Cancer Father     colon  . Alzheimer's disease Father   . Heart disease Father   .  Cancer Brother     stomach  . Stroke Maternal Grandfather   . Diabetes Maternal Grandfather    History   Social History  . Marital Status: Married    Spouse Name: N/A  . Number of Children: N/A  . Years of Education: N/A   Occupational History  . Not on file.   Social History Main Topics  . Smoking status: Never Smoker   . Smokeless tobacco: Not on file  . Alcohol Use: No  . Drug Use: No  . Sexual Activity: No    ROS Constitutional: Denies fever, chills, weight loss/gain, headaches, insomnia,  night sweats or change in appetite. Does c/o fatigue. Eyes: Denies redness, blurred vision, diplopia, discharge, itchy or watery eyes.  ENT: Denies discharge,  congestion, post nasal drip, epistaxis, sore throat, earache, hearing loss, dental pain, Tinnitus, Vertigo, Sinus pain or snoring.  Cardio: Denies chest pain, palpitations, irregular heartbeat, syncope, dyspnea, diaphoresis, orthopnea, PND, claudication or edema Respiratory: denies cough, dyspnea, DOE, pleurisy, hoarseness, laryngitis or wheezing.  Gastrointestinal: Denies dysphagia, heartburn, reflux, water brash, pain, cramps, nausea, vomiting, bloating, diarrhea, constipation, hematemesis, melena, hematochezia, jaundice or hemorrhoids Genitourinary: Denies dysuria, frequency, urgency, nocturia, hesitancy, discharge, hematuria or flank pain Musculoskeletal: Denies arthralgia, myalgia, stiffness, Jt. Swelling, pain, limp or strain/sprain. Denies Falls. Skin: Denies puritis, rash, hives, warts, acne, eczema or change in skin lesion Neuro: No weakness, tremor, incoordination, spasms, paresthesia or pain Psychiatric: Denies confusion, memory loss or sensory loss. Denies Depression. Endocrine: Denies change in weight, skin, hair change, nocturia, and paresthesia, diabetic polys, visual blurring or hyper / hypo glycemic episodes.  Heme/Lymph: No excessive bleeding, bruising or enlarged lymph nodes.  Physical Exam  BP 116/78   Pulse 64  Temp 97.5 F   Resp 16  Ht 5\' 4"    Wt 144 lb 6.4 oz     BMI 24.77  General Appearance: Well nourished, in no apparent distress. Eyes: PERRLA, EOMs, conjunctiva no swelling or erythema, normal fundi and vessels. Sinuses: No frontal/maxillary tenderness ENT/Mouth: EACs patent / TMs  nl. Nares clear without erythema, swelling, mucoid exudates. Oral hygiene is good. No erythema, swelling, or exudate. Tongue normal, non-obstructing. Tonsils not swollen or erythematous. Hearing normal.  Neck: Supple, thyroid normal. No bruits, nodes or JVD. Respiratory: Respiratory effort normal.  BS equal and clear bilateral without rales, rhonci, wheezing or stridor. Cardio: Heart  sounds are normal with regular rate and rhythm and no murmurs, rubs or gallops. Peripheral pulses are normal and equal bilaterally with 1+ ankle edema. No aortic or femoral bruits. Chest: Severe kypho roto thorolumbar scoliosis with thoracic hump to left upper back. Abdomen: Flat, soft, with bowel sounds. Nontender, no guarding, rebound, hernias, masses, or organomegaly.  Lymphatics: Non tender without lymphadenopathy.  Genitourinary: deferred for age.  Musculoskeletal: Generalized decrease in muscle power, tone & bulk. Full ROM all peripheral extremities, joint stability and normal gait. Skin: Warm and dry without rashes, lesions, cyanosis, clubbing or  ecchymosis.  Neuro: Cranial nerves intact, reflexes equal bilaterally. Normal muscle tone, no cerebellar symptoms. Sensation intact.  Pysch: Awake and oriented X 3 with normal affect, insight and judgment appropriate.   Assessment and Plan  1. Essential hypertension  - Microalbumin / creatinine urine ratio - EKG 12-Lead - [place], RETROPERITNL ABD,  LTD - TSH  2. Hyperlipidemia  - Lipid panel  3. Prediabetes  - Hemoglobin A1c - Insulin, random  4. Vitamin D deficiency  - Vit D  25 hydroxy  5. Screening for rectal cancer  -  POC Hemoccult Bld/Stl   6. Prostate cancer screening   7. Depression screen   8. Medication management  - Urine Microscopic - CBC with Differential/Platelet - BASIC METABOLIC PANEL WITH GFR - Hepatic function panel - Magnesium  9. Elevated PSA, hx  - PSA   Continue prudent diet as discussed, weight control, BP monitoring, regular exercise, and medications as discussed.  Discussed med effects and SE's. Routine screening labs and tests as requested with regular follow-up as recommended.  Over 40 minutes of exam, counseling &  chart review was performed

## 2015-04-13 LAB — URINALYSIS, MICROSCOPIC ONLY
BACTERIA UA: NONE SEEN [HPF]
CRYSTALS: NONE SEEN [HPF]
Casts: NONE SEEN [LPF]
RBC / HPF: NONE SEEN RBC/HPF (ref <=2)
Squamous Epithelial / LPF: NONE SEEN [HPF] (ref <=5)
WBC, UA: NONE SEEN WBC/HPF (ref <=5)
YEAST: NONE SEEN [HPF]

## 2015-04-13 LAB — HEPATIC FUNCTION PANEL
ALT: 17 U/L (ref 9–46)
AST: 23 U/L (ref 10–35)
Albumin: 4 g/dL (ref 3.6–5.1)
Alkaline Phosphatase: 55 U/L (ref 40–115)
BILIRUBIN DIRECT: 0.2 mg/dL (ref <=0.2)
BILIRUBIN INDIRECT: 0.5 mg/dL (ref 0.2–1.2)
BILIRUBIN TOTAL: 0.7 mg/dL (ref 0.2–1.2)
Total Protein: 6.5 g/dL (ref 6.1–8.1)

## 2015-04-13 LAB — LIPID PANEL
CHOL/HDL RATIO: 3.1 ratio (ref <=5.0)
Cholesterol: 172 mg/dL (ref 125–200)
HDL: 56 mg/dL (ref >=40)
LDL Cholesterol: 103 mg/dL (ref <130)
Triglycerides: 65 mg/dL (ref <150)
VLDL: 13 mg/dL (ref <30)

## 2015-04-13 LAB — VITAMIN D 25 HYDROXY (VIT D DEFICIENCY, FRACTURES): Vit D, 25-Hydroxy: 59 ng/mL (ref 30–100)

## 2015-04-13 LAB — BASIC METABOLIC PANEL WITH GFR
BUN: 25 mg/dL (ref 7–25)
CO2: 35 mmol/L — AB (ref 20–31)
CREATININE: 0.87 mg/dL (ref 0.70–1.11)
Calcium: 8.9 mg/dL (ref 8.6–10.3)
Chloride: 101 mmol/L (ref 98–110)
GFR, Est African American: >89 mL/min (ref >=60)
GFR, Est Non African American: 80 mL/min (ref >=60)
Glucose, Bld: 91 mg/dL (ref 65–99)
POTASSIUM: 4.9 mmol/L (ref 3.5–5.3)
Sodium: 145 mmol/L (ref 135–146)

## 2015-04-13 LAB — MAGNESIUM: MAGNESIUM: 1.9 mg/dL (ref 1.5–2.5)

## 2015-04-13 LAB — INSULIN, RANDOM: Insulin: 7.4 u[IU]/mL (ref 2.0–19.6)

## 2015-04-13 LAB — MICROALBUMIN / CREATININE URINE RATIO
CREATININE, URINE: 108.9 mg/dL
MICROALB/CREAT RATIO: 7.3 mg/g (ref 0.0–30.0)
Microalb, Ur: 0.8 mg/dL (ref <2.0)

## 2015-04-13 LAB — PSA: PSA: 0.01 ng/mL (ref <=4.00)

## 2015-04-22 DIAGNOSIS — H2513 Age-related nuclear cataract, bilateral: Secondary | ICD-10-CM | POA: Diagnosis not present

## 2015-04-22 DIAGNOSIS — H43813 Vitreous degeneration, bilateral: Secondary | ICD-10-CM | POA: Diagnosis not present

## 2015-04-22 DIAGNOSIS — H31001 Unspecified chorioretinal scars, right eye: Secondary | ICD-10-CM | POA: Diagnosis not present

## 2015-04-26 ENCOUNTER — Other Ambulatory Visit: Payer: Self-pay | Admitting: *Deleted

## 2015-04-26 DIAGNOSIS — Z1212 Encounter for screening for malignant neoplasm of rectum: Secondary | ICD-10-CM

## 2015-04-26 LAB — POC HEMOCCULT BLD/STL (HOME/3-CARD/SCREEN)
FECAL OCCULT BLD: NEGATIVE
FECAL OCCULT BLD: NEGATIVE
FECAL OCCULT BLD: NEGATIVE

## 2015-06-09 ENCOUNTER — Ambulatory Visit (INDEPENDENT_AMBULATORY_CARE_PROVIDER_SITE_OTHER): Payer: Medicare Other | Admitting: *Deleted

## 2015-06-09 DIAGNOSIS — Z23 Encounter for immunization: Secondary | ICD-10-CM

## 2015-07-21 ENCOUNTER — Encounter: Payer: Self-pay | Admitting: Internal Medicine

## 2015-07-21 ENCOUNTER — Ambulatory Visit (INDEPENDENT_AMBULATORY_CARE_PROVIDER_SITE_OTHER): Payer: Medicare Other | Admitting: Internal Medicine

## 2015-07-21 VITALS — BP 146/70 | HR 56 | Temp 98.2°F | Resp 16 | Ht 63.5 in | Wt 146.0 lb

## 2015-07-21 DIAGNOSIS — E559 Vitamin D deficiency, unspecified: Secondary | ICD-10-CM

## 2015-07-21 DIAGNOSIS — I10 Essential (primary) hypertension: Secondary | ICD-10-CM

## 2015-07-21 DIAGNOSIS — M4120 Other idiopathic scoliosis, site unspecified: Secondary | ICD-10-CM

## 2015-07-21 DIAGNOSIS — Z79899 Other long term (current) drug therapy: Secondary | ICD-10-CM | POA: Diagnosis not present

## 2015-07-21 DIAGNOSIS — E782 Mixed hyperlipidemia: Secondary | ICD-10-CM | POA: Diagnosis not present

## 2015-07-21 DIAGNOSIS — R7303 Prediabetes: Secondary | ICD-10-CM

## 2015-07-21 DIAGNOSIS — G4700 Insomnia, unspecified: Secondary | ICD-10-CM

## 2015-07-21 LAB — LIPID PANEL
Cholesterol: 157 mg/dL (ref 125–200)
HDL: 55 mg/dL (ref >=40)
LDL Cholesterol: 88 mg/dL (ref <130)
Total CHOL/HDL Ratio: 2.9 Ratio (ref <=5.0)
Triglycerides: 68 mg/dL (ref <150)
VLDL: 14 mg/dL (ref <30)

## 2015-07-21 LAB — CBC WITH DIFFERENTIAL/PLATELET
Basophils Absolute: 0 10*3/uL (ref 0.0–0.1)
Basophils Relative: 0 % (ref 0–1)
EOS ABS: 0.1 10*3/uL (ref 0.0–0.7)
Eosinophils Relative: 1 % (ref 0–5)
HCT: 42.9 % (ref 39.0–52.0)
HEMOGLOBIN: 14.4 g/dL (ref 13.0–17.0)
LYMPHS ABS: 1.6 10*3/uL (ref 0.7–4.0)
Lymphocytes Relative: 32 % (ref 12–46)
MCH: 32.4 pg (ref 26.0–34.0)
MCHC: 33.6 g/dL (ref 30.0–36.0)
MCV: 96.4 fL (ref 78.0–100.0)
MPV: 10.5 fL (ref 8.6–12.4)
Monocytes Absolute: 0.5 10*3/uL (ref 0.1–1.0)
Monocytes Relative: 10 % (ref 3–12)
NEUTROS ABS: 2.9 10*3/uL (ref 1.7–7.7)
NEUTROS PCT: 57 % (ref 43–77)
Platelets: 146 10*3/uL — ABNORMAL LOW (ref 150–400)
RBC: 4.45 MIL/uL (ref 4.22–5.81)
RDW: 13.4 % (ref 11.5–15.5)
WBC: 5.1 10*3/uL (ref 4.0–10.5)

## 2015-07-21 LAB — HEPATIC FUNCTION PANEL
ALT: 24 U/L (ref 9–46)
AST: 28 U/L (ref 10–35)
Albumin: 3.9 g/dL (ref 3.6–5.1)
Alkaline Phosphatase: 56 U/L (ref 40–115)
Bilirubin, Direct: 0.2 mg/dL (ref <=0.2)
Indirect Bilirubin: 0.5 mg/dL (ref 0.2–1.2)
TOTAL PROTEIN: 6.3 g/dL (ref 6.1–8.1)
Total Bilirubin: 0.7 mg/dL (ref 0.2–1.2)

## 2015-07-21 LAB — BASIC METABOLIC PANEL WITH GFR
BUN: 21 mg/dL (ref 7–25)
CHLORIDE: 100 mmol/L (ref 98–110)
CO2: 33 mmol/L — ABNORMAL HIGH (ref 20–31)
Calcium: 9 mg/dL (ref 8.6–10.3)
Creat: 0.69 mg/dL — ABNORMAL LOW (ref 0.70–1.11)
GFR, Est African American: >89 mL/min (ref >=60)
GFR, Est Non African American: 88 mL/min (ref >=60)
Glucose, Bld: 86 mg/dL (ref 65–99)
Potassium: 4.6 mmol/L (ref 3.5–5.3)
Sodium: 142 mmol/L (ref 135–146)

## 2015-07-21 LAB — TSH: TSH: 0.574 u[IU]/mL (ref 0.350–4.500)

## 2015-07-21 NOTE — Progress Notes (Signed)
Patient ID: Timothy Vaughn, male   DOB: 12-24-1928, 81 y.o.   MRN: [id number] Assessment and Plan:  Hypertension:  -Continue medication,  -monitor blood pressure at home.  -Continue DASH diet.   -Reminder to go to the ER if any CP, SOB, nausea, dizziness, severe HA, changes vision/speech, left arm numbness and tingling, and jaw pain.  Cholesterol: -Continue diet and exercise.  -Check cholesterol.   Pre-diabetes: -Continue diet and exercise.  -Check A1C  Vitamin D Def: -check level -continue medications.   RAD -try incruse x 2 weeks -sample given -given spacer for albuterol -patient to call if breathing worse  Given wifes declining condition have discussed home health to help with medication management and also getting affairs settled in the event that something were to happen to [place].    Continue diet and meds as discussed. Further disposition pending results of labs.  HPI 81 y.o. male  presents for 3 month follow up with hypertension, hyperlipidemia, prediabetes and vitamin D.   His blood pressure has been controlled at home, today their BP is BP: (!) 146/70 mmHg.   He does not workout. He denies chest pain, shortness of breath, dizziness.   He is on cholesterol medication and denies myalgias. His cholesterol is at goal. The cholesterol last visit was:   Lab Results  Component Value Date   CHOL 172 04/12/2015   HDL 56 04/12/2015   LDLCALC 103 04/12/2015   TRIG 65 04/12/2015   CHOLHDL 3.1 04/12/2015     He has been working on diet and exercise for prediabetes, and denies foot ulcerations, hyperglycemia, hypoglycemia , increased appetite, nausea, paresthesia of the feet, polydipsia, polyuria, visual disturbances, vomiting and weight loss. Last A1C in the office was:  Lab Results  Component Value Date   HGBA1C 6.0* 04/12/2015    Patient is on Vitamin D supplement.  Lab Results  Component Value Date   VD25OH 29 04/12/2015     He reports that he has been having  a hard time taking care of his wife lately as she has been having worsening issues with her memory and with falling.  He reports that he is dealing with it okay but it is taking away from his time to take care of the house.  He reports that he has been having some coughing lately.  Current Medications:  Current Outpatient Prescriptions on File Prior to Visit  Medication Sig Dispense Refill  . Albuterol Sulfate (PROAIR RESPICLICK) 123XX123 (90 BASE) MCG/ACT AEPB Inhale 2 Act into the lungs every 6 (six) hours as needed (for shortness of breath). 1 each 0  . Ascorbic Acid (VITAMIN C) 100 MG tablet Take 100 mg by mouth daily.    Marland Kitchen aspirin 81 MG tablet Take 81 mg by mouth daily.    . Cholecalciferol (VITAMIN D PO) Take 8,000 Int'l Units by mouth daily.     . Flaxseed, Linseed, (FLAXSEED OIL PO) Take by mouth daily.    . fluorouracil (EFUDEX) 5 % cream Applies PRN    . furosemide (LASIX) 40 MG tablet Take 40 mg by mouth daily as needed.     . Multiple Vitamin (MULTI VITAMIN DAILY PO) Take by mouth.    . Omega-3 Fatty Acids (FISH OIL PO) Take by mouth.    . pravastatin (PRAVACHOL) 40 MG tablet TAKE 1 TABLET AT BEDTIME FOR CHOLESTEROL 90 tablet 99  . traZODone (DESYREL) 150 MG tablet Take 1/3 to 1/2 to 1 tablet about 1 hour before sleep 90 tablet 99  No current facility-administered medications on file prior to visit.    Medical History:  Past Medical History  Diagnosis Date  . Hypertension   . Hyperlipidemia   . Pre-diabetes   . Vitamin D deficiency   . Cancer [facility])     prostate    Allergies:  Allergies  Allergen Reactions  . Lipitor [Atorvastatin] Other (See Comments)    Severe NIV  . Niaspan [Niacin Er] Itching     Review of Systems:  Review of Systems  Constitutional: Negative for fever, chills and malaise/fatigue.  HENT: Negative for congestion, ear discharge and sore throat.   Respiratory: Positive for cough. Negative for shortness of breath and wheezing.   Cardiovascular:  Negative for chest pain, palpitations and leg swelling.  Gastrointestinal: Negative for diarrhea, constipation, blood in stool and melena.  Genitourinary: Negative.   Neurological: Negative for dizziness, sensory change, loss of consciousness and headaches.  Psychiatric/Behavioral: Negative for depression. The patient has insomnia. The patient is not nervous/anxious.     Family history- Review and unchanged  Social history- Review and unchanged  Physical Exam: BP 146/70 mmHg  Pulse 56  Temp(Src) 98.2 F (36.8 C) (Temporal)  Resp 16  Ht 5' 3.5" (1.613 m)  Wt 146 lb (66.225 kg)  BMI 25.45 kg/m2 Wt Readings from Last 3 Encounters:  07/21/15 146 lb (66.225 kg)  04/12/15 144 lb 6.4 oz (65.499 kg)  01/24/15 147 lb 6.4 oz (66.86 kg)    General Appearance: Well nourished well developed, in no apparent distress. Eyes: PERRLA, EOMs, conjunctiva no swelling or erythema ENT/Mouth: Ear canals normal without obstruction, swelling, erythma, discharge.  TMs normal bilaterally.  Oropharynx moist, clear, without exudate, or postoropharyngeal swelling. Neck: Supple, thyroid normal,no cervical adenopathy  Respiratory: Respiratory effort normal, Breath sounds clear A&P without rhonchi, wheeze, or rale.  No retractions, no accessory usage. Cardio: RRR with no MRGs. Brisk peripheral pulses without edema.  Abdomen: Soft, + BS,  Non tender, no guarding, rebound, hernias, masses. Musculoskeletal: Full ROM, 5/5 strength, Normal gait Skin: Warm, dry without rashes, lesions, ecchymosis.  Neuro: Awake and oriented X 3, Cranial nerves intact. Normal muscle tone, no cerebellar symptoms. Psych: Normal affect, Insight and Judgment appropriate.    Starlyn Skeans, PA-C 11:29 AM [facility]

## 2015-08-01 ENCOUNTER — Encounter: Payer: Self-pay | Admitting: Internal Medicine

## 2015-08-01 ENCOUNTER — Ambulatory Visit (INDEPENDENT_AMBULATORY_CARE_PROVIDER_SITE_OTHER): Payer: Medicare Other | Admitting: Internal Medicine

## 2015-08-01 VITALS — BP 126/64 | HR 60 | Temp 97.8°F | Resp 16 | Ht 63.5 in | Wt 144.0 lb

## 2015-08-01 DIAGNOSIS — J069 Acute upper respiratory infection, unspecified: Secondary | ICD-10-CM | POA: Diagnosis not present

## 2015-08-01 MED ORDER — PREDNISONE 20 MG PO TABS: ORAL_TABLET | ORAL | Status: DC

## 2015-08-01 MED ORDER — PROMETHAZINE-DM 6.25-15 MG/5ML PO SYRP: ORAL_SOLUTION | ORAL | Status: DC

## 2015-08-01 MED ORDER — AZITHROMYCIN 250 MG PO TABS: ORAL_TABLET | ORAL | Status: DC

## 2015-08-01 NOTE — Patient Instructions (Signed)
Please take the prednisone until it is gone.  Take the cough syrup as needed every 8 hours for severe coughing.  Use your albuterol every 6 hours as needed for coughing and shortness of breath.  Please take claritin daily to help dry up congestion.  Please drink plenty of fluids.  If you are no better in 3 days please take the zpak.

## 2015-08-01 NOTE — Progress Notes (Signed)
Patient ID: Timothy Vaughn, male   DOB: [birth year], 81 y.o.   MRN: [id number]  HPI  Patient presents to the office for evaluation of cough.  It has been going on for 2 days.  Patient reports all the time, dry cough.  They also endorse change in voice and scratchy throat, runny nose..  They have tried none.  They report that nothing has worked.  They denies other sick contacts.  Review of Systems  Constitutional: Negative for fever, chills and malaise/fatigue.  HENT: Positive for congestion. Negative for ear pain and sore throat.   Eyes: Negative.   Respiratory: Positive for cough and wheezing. Negative for sputum production and shortness of breath.   Cardiovascular: Negative for chest pain, palpitations and leg swelling.  Skin: Negative.   Neurological: Negative for headaches.    PE:  Filed Vitals:   08/01/15 1524  BP: 126/64  Pulse: 60  Temp: 97.8 F (36.6 C)  Resp: 16   General:  Alert and non-toxic, WDWN, NAD HEENT: NCAT, PERLA, EOM normal, no occular discharge or erythema.  Nasal mucosal edema with sinus tenderness to palpation.  Oropharynx clear with minimal oropharyngeal edema and erythema.  Mucous membranes moist and pink. Neck:  Cervical adenopathy Chest:  RRR no MRGs.  Lungs clear to auscultation A&P with no wheezes rhonchi or rales.   Abdomen: +BS x 4 quadrants, soft, non-tender, no guarding, rigidity, or rebound. Skin: warm and dry no rash Neuro: A&Ox4, CN II-XII grossly intact  Assessment and Plan:   1. Acute URI -prednisone -scheduled albuterol q6hrs -claritin -fluids -tylenol prn -if no improvement 3 days then zpak

## 2015-08-12 DIAGNOSIS — D1801 Hemangioma of skin and subcutaneous tissue: Secondary | ICD-10-CM | POA: Diagnosis not present

## 2015-08-12 DIAGNOSIS — L570 Actinic keratosis: Secondary | ICD-10-CM | POA: Diagnosis not present

## 2015-08-12 DIAGNOSIS — L821 Other seborrheic keratosis: Secondary | ICD-10-CM | POA: Diagnosis not present

## 2015-08-12 DIAGNOSIS — D225 Melanocytic nevi of trunk: Secondary | ICD-10-CM | POA: Diagnosis not present

## 2015-08-12 DIAGNOSIS — Z85828 Personal history of other malignant neoplasm of skin: Secondary | ICD-10-CM | POA: Diagnosis not present

## 2015-09-01 ENCOUNTER — Ambulatory Visit (INDEPENDENT_AMBULATORY_CARE_PROVIDER_SITE_OTHER): Payer: Medicare Other | Admitting: Internal Medicine

## 2015-09-01 VITALS — BP 162/64 | HR 80 | Temp 99.8°F | Resp 18 | Ht 63.5 in | Wt 148.0 lb

## 2015-09-01 DIAGNOSIS — R062 Wheezing: Secondary | ICD-10-CM

## 2015-09-01 MED ORDER — AZITHROMYCIN 250 MG PO TABS: ORAL_TABLET | ORAL | Status: DC

## 2015-09-01 MED ORDER — IPRATROPIUM-ALBUTEROL 0.5-2.5 (3) MG/3ML IN SOLN
3.0000 mL | Freq: Once | RESPIRATORY_TRACT | Status: AC
  Administered 2015-09-01: 3 mL via RESPIRATORY_TRACT

## 2015-09-01 MED ORDER — POTASSIUM CHLORIDE CRYS ER 20 MEQ PO TBCR: 40.0000 meq | EXTENDED_RELEASE_TABLET | Freq: Every day | ORAL | Status: DC

## 2015-09-01 MED ORDER — PROMETHAZINE-DM 6.25-15 MG/5ML PO SYRP: ORAL_SOLUTION | ORAL | Status: DC

## 2015-09-01 NOTE — Patient Instructions (Addendum)
Please take 80 mg of lasix or your fluid pill in the morning.  Please take 40 mg of lasix in the afternoon.    Please take 40 mEq of potassium in the mornings with your fluid pill.    Please limit salt intake.   Please drink plenty of water.  Please elevate feet above your heart.  Please take zpak until it is gone.  Please take cough medication as needed.    Please use your nebulizer as often as you need it at home.

## 2015-09-01 NOTE — Progress Notes (Signed)
Patient ID: Timothy Vaughn, male   DOB: December 26, 1928, 81 y.o.   MRN: [id number]  HPI  Patient presents to the office for evaluation of cough.  It has been going on for 3 days.  Patient reports night > day, dry, worse with lying down.  They also endorse chills, postnasal drip, shortness of breath, wheezing and malaise, fatigue..  They have tried none.  They report that nothing has worked.  They denies other sick contacts.  He reports that he has not been taking his fluids.  He hasn't taken them like he normally does in 2 weeks.    Review of Systems  Constitutional: Positive for chills and malaise/fatigue. Negative for fever.  HENT: Negative for congestion, ear pain and sore throat.   Respiratory: Positive for cough, shortness of breath and wheezing.   Cardiovascular: Negative for chest pain, palpitations and leg swelling.  Neurological: Negative for headaches.    PE:  Wt Readings from Last 3 Encounters:  09/01/15 148 lb (67.132 kg)  08/01/15 144 lb (65.318 kg)  07/21/15 146 lb (66.225 kg)     General:  Alert and non-toxic, WDWN, NAD HEENT: NCAT, PERLA, EOM normal, no occular discharge or erythema.  Nasal mucosal edema with sinus tenderness to palpation.  Oropharynx clear with minimal oropharyngeal edema and erythema.  Mucous membranes moist and pink. Neck:  Cervical adenopathy Chest:  RRR no MRGs.  Lungs clear to auscultation A&P with  rhonchi or rales.  Diffuse wheeze bilaterally. 3+ leg edema Abdomen: +BS x 4 quadrants, soft, non-tender, no guarding, rigidity, or rebound. Skin: warm and dry no rash Neuro: A&Ox4, CN II-XII grossly intact  Assessment and Plan:   1. Wheezing -increase lasix to 80 mg in the am and 40 mg in the pm -take KCl in the morning -zpak -phenergan dextro -duoneb prn at home - ipratropium-albuterol (DUONEB) 0.5-2.5 (3) MG/3ML nebulizer solution 3 mL; Take 3 mLs by nebulization once.

## 2015-09-06 ENCOUNTER — Inpatient Hospital Stay (HOSPITAL_COMMUNITY)
Admission: EM | Admit: 2015-09-06 | Discharge: 2015-09-12 | DRG: 193 | Disposition: A | Discharge status: Home / Self Care | Payer: Medicare Other | Attending: Internal Medicine | Admitting: Internal Medicine

## 2015-09-06 ENCOUNTER — Emergency Department (HOSPITAL_COMMUNITY): Payer: Medicare Other

## 2015-09-06 ENCOUNTER — Encounter (HOSPITAL_COMMUNITY): Payer: Self-pay | Admitting: Emergency Medicine

## 2015-09-06 ENCOUNTER — Encounter: Payer: Self-pay | Admitting: Internal Medicine

## 2015-09-06 ENCOUNTER — Ambulatory Visit: Payer: Medicare Other | Admitting: Internal Medicine

## 2015-09-06 VITALS — BP 118/66 | HR 92 | Temp 98.1°F | Resp 18 | Ht 64.0 in | Wt 142.0 lb

## 2015-09-06 DIAGNOSIS — I10 Essential (primary) hypertension: Secondary | ICD-10-CM

## 2015-09-06 DIAGNOSIS — R799 Abnormal finding of blood chemistry, unspecified: Secondary | ICD-10-CM | POA: Diagnosis not present

## 2015-09-06 DIAGNOSIS — E876 Hypokalemia: Secondary | ICD-10-CM | POA: Diagnosis present

## 2015-09-06 DIAGNOSIS — G4710 Hypersomnia, unspecified: Secondary | ICD-10-CM | POA: Diagnosis present

## 2015-09-06 DIAGNOSIS — E1165 Type 2 diabetes mellitus with hyperglycemia: Secondary | ICD-10-CM | POA: Diagnosis present

## 2015-09-06 DIAGNOSIS — J189 Pneumonia, unspecified organism: Principal | ICD-10-CM | POA: Diagnosis present

## 2015-09-06 DIAGNOSIS — G4734 Idiopathic sleep related nonobstructive alveolar hypoventilation: Secondary | ICD-10-CM | POA: Diagnosis present

## 2015-09-06 DIAGNOSIS — R0602 Shortness of breath: Secondary | ICD-10-CM

## 2015-09-06 DIAGNOSIS — J209 Acute bronchitis, unspecified: Secondary | ICD-10-CM | POA: Diagnosis present

## 2015-09-06 DIAGNOSIS — J9621 Acute and chronic respiratory failure with hypoxia: Secondary | ICD-10-CM | POA: Diagnosis present

## 2015-09-06 DIAGNOSIS — M419 Scoliosis, unspecified: Secondary | ICD-10-CM | POA: Diagnosis not present

## 2015-09-06 DIAGNOSIS — I361 Nonrheumatic tricuspid (valve) insufficiency: Secondary | ICD-10-CM | POA: Diagnosis present

## 2015-09-06 DIAGNOSIS — J45901 Unspecified asthma with (acute) exacerbation: Secondary | ICD-10-CM | POA: Diagnosis present

## 2015-09-06 DIAGNOSIS — J208 Acute bronchitis due to other specified organisms: Secondary | ICD-10-CM | POA: Diagnosis present

## 2015-09-06 DIAGNOSIS — E785 Hyperlipidemia, unspecified: Secondary | ICD-10-CM | POA: Diagnosis present

## 2015-09-06 DIAGNOSIS — J984 Other disorders of lung: Secondary | ICD-10-CM

## 2015-09-06 DIAGNOSIS — R0902 Hypoxemia: Secondary | ICD-10-CM

## 2015-09-06 DIAGNOSIS — J40 Bronchitis, not specified as acute or chronic: Secondary | ICD-10-CM

## 2015-09-06 DIAGNOSIS — R7989 Other specified abnormal findings of blood chemistry: Secondary | ICD-10-CM | POA: Diagnosis present

## 2015-09-06 DIAGNOSIS — G9341 Metabolic encephalopathy: Secondary | ICD-10-CM | POA: Diagnosis present

## 2015-09-06 DIAGNOSIS — J9602 Acute respiratory failure with hypercapnia: Secondary | ICD-10-CM | POA: Diagnosis not present

## 2015-09-06 DIAGNOSIS — L8990 Pressure ulcer of unspecified site, unspecified stage: Secondary | ICD-10-CM | POA: Diagnosis present

## 2015-09-06 DIAGNOSIS — J9622 Acute and chronic respiratory failure with hypercapnia: Secondary | ICD-10-CM | POA: Diagnosis present

## 2015-09-06 DIAGNOSIS — M4120 Other idiopathic scoliosis, site unspecified: Secondary | ICD-10-CM | POA: Diagnosis present

## 2015-09-06 DIAGNOSIS — R05 Cough: Secondary | ICD-10-CM | POA: Diagnosis not present

## 2015-09-06 DIAGNOSIS — J811 Chronic pulmonary edema: Secondary | ICD-10-CM

## 2015-09-06 DIAGNOSIS — J9601 Acute respiratory failure with hypoxia: Secondary | ICD-10-CM | POA: Diagnosis not present

## 2015-09-06 DIAGNOSIS — J9600 Acute respiratory failure, unspecified whether with hypoxia or hypercapnia: Secondary | ICD-10-CM | POA: Diagnosis not present

## 2015-09-06 DIAGNOSIS — D7589 Other specified diseases of blood and blood-forming organs: Secondary | ICD-10-CM | POA: Diagnosis present

## 2015-09-06 DIAGNOSIS — N179 Acute kidney failure, unspecified: Secondary | ICD-10-CM | POA: Diagnosis present

## 2015-09-06 DIAGNOSIS — I071 Rheumatic tricuspid insufficiency: Secondary | ICD-10-CM | POA: Diagnosis present

## 2015-09-06 DIAGNOSIS — E782 Mixed hyperlipidemia: Secondary | ICD-10-CM

## 2015-09-06 DIAGNOSIS — R4182 Altered mental status, unspecified: Secondary | ICD-10-CM

## 2015-09-06 DIAGNOSIS — R0689 Other abnormalities of breathing: Secondary | ICD-10-CM

## 2015-09-06 DIAGNOSIS — I509 Heart failure, unspecified: Secondary | ICD-10-CM | POA: Diagnosis not present

## 2015-09-06 DIAGNOSIS — Z888 Allergy status to other drugs, medicaments and biological substances status: Secondary | ICD-10-CM | POA: Diagnosis not present

## 2015-09-06 DIAGNOSIS — R0682 Tachypnea, not elsewhere classified: Secondary | ICD-10-CM | POA: Diagnosis present

## 2015-09-06 LAB — URINALYSIS, ROUTINE W REFLEX MICROSCOPIC
BILIRUBIN URINE: NEGATIVE
Glucose, UA: NEGATIVE mg/dL
Ketones, ur: NEGATIVE mg/dL
LEUKOCYTES UA: NEGATIVE
NITRITE: NEGATIVE
PH: 5.5 (ref 5.0–8.0)
Protein, ur: 30 mg/dL — AB
SPECIFIC GRAVITY, URINE: 1.024 (ref 1.005–1.030)

## 2015-09-06 LAB — COMPREHENSIVE METABOLIC PANEL
ALT: 30 U/L (ref 17–63)
AST: 38 U/L (ref 15–41)
Albumin: 3.9 g/dL (ref 3.5–5.0)
Alkaline Phosphatase: 69 U/L (ref 38–126)
Anion gap: 6 (ref 5–15)
BUN: 34 mg/dL — ABNORMAL HIGH (ref 6–20)
CHLORIDE: 92 mmol/L — AB (ref 101–111)
CO2: 44 mmol/L — AB (ref 22–32)
CREATININE: 1.02 mg/dL (ref 0.61–1.24)
Calcium: 8.5 mg/dL — ABNORMAL LOW (ref 8.9–10.3)
GFR calc non Af Amer: >60 mL/min (ref >60)
Glucose, Bld: 146 mg/dL — ABNORMAL HIGH (ref 65–99)
Potassium: 3.7 mmol/L (ref 3.5–5.1)
SODIUM: 142 mmol/L (ref 135–145)
Total Bilirubin: 1.2 mg/dL (ref 0.3–1.2)
Total Protein: 7 g/dL (ref 6.5–8.1)

## 2015-09-06 LAB — URINE MICROSCOPIC-ADD ON

## 2015-09-06 LAB — CBC WITH DIFFERENTIAL/PLATELET
BASOS ABS: 0 10*3/uL (ref 0.0–0.1)
Basophils Relative: 0 %
EOS ABS: 0 10*3/uL (ref 0.0–0.7)
EOS PCT: 0 %
HCT: 48.7 % (ref 39.0–52.0)
Hemoglobin: 15.4 g/dL (ref 13.0–17.0)
LYMPHS ABS: 1.4 10*3/uL (ref 0.7–4.0)
Lymphocytes Relative: 18 %
MCH: 32.4 pg (ref 26.0–34.0)
MCHC: 31.6 g/dL (ref 30.0–36.0)
MCV: 102.5 fL — ABNORMAL HIGH (ref 78.0–100.0)
Monocytes Absolute: 1 10*3/uL (ref 0.1–1.0)
Monocytes Relative: 13 %
Neutro Abs: 5.3 10*3/uL (ref 1.7–7.7)
Neutrophils Relative %: 69 %
PLATELETS: 155 10*3/uL (ref 150–400)
RBC: 4.75 MIL/uL (ref 4.22–5.81)
RDW: 13.2 % (ref 11.5–15.5)
WBC: 7.6 10*3/uL (ref 4.0–10.5)

## 2015-09-06 LAB — T4, FREE: FREE T4: 1.29 ng/dL — AB (ref 0.61–1.12)

## 2015-09-06 LAB — BRAIN NATRIURETIC PEPTIDE: B NATRIURETIC PEPTIDE 5: 145.9 pg/mL — AB (ref 0.0–100.0)

## 2015-09-06 LAB — C-REACTIVE PROTEIN: CRP: 2.7 mg/dL — AB (ref <1.0)

## 2015-09-06 MED ORDER — SODIUM CHLORIDE 0.9 % IV SOLN
INTRAVENOUS | Status: DC
  Administered 2015-09-06: 17:00:00 via INTRAVENOUS

## 2015-09-06 MED ORDER — ALBUTEROL SULFATE (2.5 MG/3ML) 0.083% IN NEBU
5.0000 mg | INHALATION_SOLUTION | Freq: Once | RESPIRATORY_TRACT | Status: AC
Start: 2015-09-06 — End: 2015-09-06
  Administered 2015-09-06: 5 mg via RESPIRATORY_TRACT
  Filled 2015-09-06: qty 6

## 2015-09-06 MED ORDER — ALBUTEROL SULFATE (2.5 MG/3ML) 0.083% IN NEBU
5.0000 mg | INHALATION_SOLUTION | Freq: Once | RESPIRATORY_TRACT | Status: AC
  Administered 2015-09-06: 5 mg via RESPIRATORY_TRACT
  Filled 2015-09-06: qty 6

## 2015-09-06 MED ORDER — IPRATROPIUM-ALBUTEROL 0.5-2.5 (3) MG/3ML IN SOLN
3.0000 mL | RESPIRATORY_TRACT | Status: DC | PRN
  Filled 2015-09-06: qty 3

## 2015-09-06 MED ORDER — VITAMIN D3 25 MCG (1000 UNIT) PO TABS
1000.0000 [IU] | ORAL_TABLET | Freq: Every day | ORAL | Status: DC
  Administered 2015-09-07 – 2015-09-12 (×6): 1000 [IU] via ORAL
  Filled 2015-09-06 (×6): qty 1

## 2015-09-06 MED ORDER — DEXTROSE 5 % IV SOLN
500.0000 mg | INTRAVENOUS | Status: DC
  Administered 2015-09-06 – 2015-09-08 (×3): 500 mg via INTRAVENOUS
  Filled 2015-09-06 (×3): qty 500

## 2015-09-06 MED ORDER — GUAIFENESIN ER 600 MG PO TB12
600.0000 mg | ORAL_TABLET | Freq: Two times a day (BID) | ORAL | Status: DC
  Administered 2015-09-06 – 2015-09-07 (×3): 600 mg via ORAL
  Filled 2015-09-06 (×3): qty 1

## 2015-09-06 MED ORDER — IPRATROPIUM-ALBUTEROL 0.5-2.5 (3) MG/3ML IN SOLN
3.0000 mL | Freq: Four times a day (QID) | RESPIRATORY_TRACT | Status: DC
  Administered 2015-09-06 – 2015-09-12 (×23): 3 mL via RESPIRATORY_TRACT
  Filled 2015-09-06 (×22): qty 3

## 2015-09-06 MED ORDER — OMEGA-3-ACID ETHYL ESTERS 1 G PO CAPS
1.0000 g | ORAL_CAPSULE | Freq: Every day | ORAL | Status: DC
Start: 2015-09-06 — End: 2015-09-12
  Administered 2015-09-07 – 2015-09-12 (×6): 1 g via ORAL
  Filled 2015-09-06 (×7): qty 1

## 2015-09-06 MED ORDER — VITAMIN C 250 MG PO TABS
125.0000 mg | ORAL_TABLET | Freq: Every day | ORAL | Status: DC
  Administered 2015-09-06 – 2015-09-12 (×7): 125 mg via ORAL
  Filled 2015-09-06 (×8): qty 1

## 2015-09-06 MED ORDER — METHYLPREDNISOLONE SODIUM SUCC 125 MG IJ SOLR
60.0000 mg | Freq: Two times a day (BID) | INTRAMUSCULAR | Status: AC
  Administered 2015-09-06 – 2015-09-07 (×3): 60 mg via INTRAVENOUS
  Filled 2015-09-06 (×3): qty 2

## 2015-09-06 MED ORDER — POTASSIUM CHLORIDE CRYS ER 20 MEQ PO TBCR
40.0000 meq | EXTENDED_RELEASE_TABLET | Freq: Every day | ORAL | Status: DC
  Administered 2015-09-07 – 2015-09-08 (×2): 40 meq via ORAL
  Filled 2015-09-06 (×3): qty 2

## 2015-09-06 MED ORDER — FUROSEMIDE 40 MG PO TABS: 40.0000 mg | ORAL_TABLET | Freq: Every day | ORAL | Status: DC | PRN

## 2015-09-06 MED ORDER — PRAVASTATIN SODIUM 40 MG PO TABS
40.0000 mg | ORAL_TABLET | Freq: Every day | ORAL | Status: DC
  Administered 2015-09-07 – 2015-09-11 (×5): 40 mg via ORAL
  Filled 2015-09-06: qty 2
  Filled 2015-09-06 (×5): qty 1

## 2015-09-06 MED ORDER — ASPIRIN 81 MG PO CHEW
81.0000 mg | CHEWABLE_TABLET | Freq: Every day | ORAL | Status: DC
  Administered 2015-09-07 – 2015-09-12 (×6): 81 mg via ORAL
  Filled 2015-09-06 (×6): qty 1

## 2015-09-06 MED ORDER — METHYLPREDNISOLONE SODIUM SUCC 125 MG IJ SOLR
125.0000 mg | Freq: Once | INTRAMUSCULAR | Status: AC
  Administered 2015-09-06: 125 mg via INTRAVENOUS
  Filled 2015-09-06: qty 2

## 2015-09-06 MED ORDER — DEXTROSE 5 % IV SOLN
1.0000 g | INTRAVENOUS | Status: DC
  Administered 2015-09-07 – 2015-09-08 (×3): 1 g via INTRAVENOUS
  Filled 2015-09-06 (×3): qty 10

## 2015-09-06 MED ORDER — ADULT MULTIVITAMIN W/MINERALS CH
1.0000 | ORAL_TABLET | Freq: Every day | ORAL | Status: DC
  Administered 2015-09-07 – 2015-09-12 (×6): 1 via ORAL
  Filled 2015-09-06 (×6): qty 1

## 2015-09-06 MED ORDER — ENOXAPARIN SODIUM 40 MG/0.4ML ~~LOC~~ SOLN
40.0000 mg | SUBCUTANEOUS | Status: DC
  Administered 2015-09-06 – 2015-09-11 (×6): 40 mg via SUBCUTANEOUS
  Filled 2015-09-06 (×7): qty 0.4

## 2015-09-06 NOTE — ED Notes (Signed)
Pt aware of urine sample.urinal in hand 

## 2015-09-06 NOTE — ED Provider Notes (Signed)
CSN: [id number]     Arrival date & time 09/06/15  1510 History   First MD Initiated Contact with Patient 09/06/15 1549     Chief Complaint  Patient presents with  . Cough     (Consider location/radiation/quality/duration/timing/severity/associated sxs/prior Treatment) Patient is a 82 y.o. male presenting with cough. The history is provided by the patient and a relative.  Cough Associated symptoms: shortness of breath and wheezing   Associated symptoms: no chest pain, no fever, no headaches and no rash    patient with the onset of an upper respiratory type infection perhaps flulike symptoms about a week ago started on Tamiflu by his primary. Did not get any better was seen in the primary care's office today patient was noted to be hypoxic and was referred here. Apparently on room air at the doctor's office room air sats were 65%. Patient however was alert and oriented. Patient has a history of scoliosis and has had risk for problems in the past due to the deformities of the thoracic cage.  Patient has been having cough feels like he's been wheezing. Patient is not normally on oxygen.   Past Medical History  Diagnosis Date  . Hypertension   . Hyperlipidemia   . Pre-diabetes   . Vitamin D deficiency   . Cancer [facility])     prostate   Past Surgical History  Procedure Laterality Date  . Prostatectomy  2001   Family History  Problem Relation Age of Onset  . Heart disease Mother   . COPD Mother   . Cancer Father     colon  . Alzheimer's disease Father   . Heart disease Father   . Cancer Brother     stomach  . Stroke Maternal Grandfather   . Diabetes Maternal Grandfather    Social History  Substance Use Topics  . Smoking status: Never Smoker   . Smokeless tobacco: None  . Alcohol Use: No    Review of Systems  Constitutional: Negative for fever.  HENT: Positive for congestion.   Eyes: Negative for visual disturbance.  Respiratory: Positive for cough, shortness of breath and  wheezing.   Cardiovascular: Negative for chest pain.  Gastrointestinal: Negative for abdominal pain.  Genitourinary: Negative for dysuria.  Musculoskeletal: Negative for back pain.  Skin: Negative for rash.  Neurological: Negative for headaches.  Hematological: Does not bruise/bleed easily.  Psychiatric/Behavioral: Negative for confusion.      Allergies  Lipitor and Niaspan  Home Medications   Prior to Admission medications   Medication Sig Start Date End Date Taking? Authorizing Provider  Albuterol Sulfate (PROAIR RESPICLICK) 123XX123 (90 BASE) MCG/ACT AEPB Inhale 2 Act into the lungs every 6 (six) hours as needed (for shortness of breath). 01/04/15  Yes Vicie Mutters, PA-C  Ascorbic Acid (VITAMIN C) 100 MG tablet Take 100 mg by mouth daily.   Yes Historical Provider, MD  aspirin 81 MG tablet Take 81 mg by mouth daily.   Yes Historical Provider, MD  azithromycin (ZITHROMAX) 250 MG tablet Take 250 mg by mouth daily. ABT Start Date 09/01/15 & End Date 09/06/15. 09/01/15  Yes Historical Provider, MD  Cholecalciferol (VITAMIN D PO) Take 8,000 Int'l Units by mouth daily.    Yes Historical Provider, MD  Multiple Vitamin (MULTI VITAMIN DAILY PO) Take 1 tablet by mouth daily.    Yes Historical Provider, MD  Omega-3 Fatty Acids (FISH OIL PO) Take 1 capsule by mouth daily.    Yes Historical Provider, MD  potassium chloride SA (K-DUR,KLOR-CON)  20 MEQ tablet Take 2 tablets (40 mEq total) by mouth daily. 09/01/15  Yes Courtney Forcucci, PA-C  pravastatin (PRAVACHOL) 40 MG tablet TAKE 1 TABLET AT BEDTIME FOR CHOLESTEROL 06/27/14  Yes Unk Pinto, MD  predniSONE (DELTASONE) 20 MG tablet Take 20 mg by mouth daily.  08/01/15  Yes Historical Provider, MD  furosemide (LASIX) 40 MG tablet Take 40 mg by mouth daily as needed for fluid or edema.     Historical Provider, MD  promethazine-dextromethorphan (PROMETHAZINE-DM) 6.25-15 MG/5ML syrup Take 5 mL as needed q8hrs  for severe coughing 09/01/15   Courtney  Forcucci, PA-C   BP 141/87 mmHg  Pulse 79  Temp(Src) 97.6 F (36.4 C) (Oral)  Resp 22  Ht 5\' 4"  (1.626 m)  Wt 63.504 kg  BMI 24.02 kg/m2  SpO2 95% Physical Exam  Constitutional: He is oriented to person, place, and time. He appears well-developed and well-nourished. No distress.  HENT:  Head: Normocephalic and atraumatic.  Mouth/Throat: Oropharynx is clear and moist.  Eyes: Conjunctivae and EOM are normal. Pupils are equal, round, and reactive to light.  Neck: Normal range of motion.  Cardiovascular: Normal rate, regular rhythm and normal heart sounds.   Pulmonary/Chest: Effort normal. He has wheezes.  Deformity of the chest wall due to scoliosis.  Abdominal: Soft. Bowel sounds are normal. There is no tenderness.  Musculoskeletal: Normal range of motion.  Neurological: He is alert and oriented to person, place, and time. No cranial nerve deficit. He exhibits normal muscle tone. Coordination normal.  Skin: Skin is warm. No erythema.  Nursing note and vitals reviewed.   ED Course  Procedures (including critical care time) Labs Review Labs Reviewed  CBC WITH DIFFERENTIAL/PLATELET - Abnormal; Notable for the following:    MCV 102.5 (*)    All other components within normal limits  COMPREHENSIVE METABOLIC PANEL - Abnormal; Notable for the following:    Chloride 92 (*)    CO2 44 (*)    Glucose, Bld 146 (*)    BUN 34 (*)    Calcium 8.5 (*)    All other components within normal limits  URINALYSIS, ROUTINE W REFLEX MICROSCOPIC (NOT AT [facility])   Results for orders placed or performed during the hospital encounter of 09/06/15  CBC with Differential/Platelet  Result Value Ref Range   WBC 7.6 4.0 - 10.5 K/uL   RBC 4.75 4.22 - 5.81 MIL/uL   Hemoglobin 15.4 13.0 - 17.0 g/dL   HCT 48.7 39.0 - 52.0 %   MCV 102.5 (H) 78.0 - 100.0 fL   MCH 32.4 26.0 - 34.0 pg   MCHC 31.6 30.0 - 36.0 g/dL   RDW 13.2 11.5 - 15.5 %   Platelets 155 150 - 400 K/uL   Neutrophils Relative % 69 %   Neutro  Abs 5.3 1.7 - 7.7 K/uL   Lymphocytes Relative 18 %   Lymphs Abs 1.4 0.7 - 4.0 K/uL   Monocytes Relative 13 %   Monocytes Absolute 1.0 0.1 - 1.0 K/uL   Eosinophils Relative 0 %   Eosinophils Absolute 0.0 0.0 - 0.7 K/uL   Basophils Relative 0 %   Basophils Absolute 0.0 0.0 - 0.1 K/uL  Comprehensive metabolic panel  Result Value Ref Range   Sodium 142 135 - 145 mmol/L   Potassium 3.7 3.5 - 5.1 mmol/L   Chloride 92 (L) 101 - 111 mmol/L   CO2 44 (H) 22 - 32 mmol/L   Glucose, Bld 146 (H) 65 - 99 mg/dL   BUN  34 (H) 6 - 20 mg/dL   Creatinine, Ser 1.02 0.61 - 1.24 mg/dL   Calcium 8.5 (L) 8.9 - 10.3 mg/dL   Total Protein 7.0 6.5 - 8.1 g/dL   Albumin 3.9 3.5 - 5.0 g/dL   AST 38 15 - 41 U/L   ALT 30 17 - 63 U/L   Alkaline Phosphatase 69 38 - 126 U/L   Total Bilirubin 1.2 0.3 - 1.2 mg/dL   GFR calc non Af Amer >60 >60 mL/min   GFR calc Af Amer >60 >60 mL/min   Anion gap 6 5 - 15    Imaging Review Dg Chest 2 View  09/06/2015  CLINICAL DATA:  Short of breath, weakness, cough for 2 day EXAM: CHEST  2 VIEW COMPARISON:  05/20/2013 FINDINGS: There is severe deformity of the thorax and severe dextroscoliosis. Heart is enlarged. Chronic opacities in both mid and lower lung zones are likely related to volume loss. IMPRESSION: Chronic changes.  No interval change since the prior study. Electronically Signed   By: Marybelle Killings M.D.   On: 09/06/2015 15:56   I have personally reviewed and evaluated these images and lab results as part of my medical decision-making.   EKG Interpretation   Date/Time:  Tuesday September 06 2015 15:57:33 EST Ventricular Rate:  81 PR Interval:  146 QRS Duration: 102 QT Interval:  387 QTC Calculation: 449 R Axis:   93 Text Interpretation:  Sinus rhythm Atrial premature complex Right axis  deviation Confirmed by Emilie Carp  MD, Aalijah Mims ([id number]) on 09/06/2015 4:07:45 PM      MDM   Final diagnoses:  Reactive airway disease, unspecified asthma severity, with acute  exacerbation  Bronchitis  Hypoxia    Bronchitis with wheezing and hypoxia. Patient improves some here with the albuterol treatments but wheezing is not completely resolved. Oxygen sats on 5 L of oxygen anywhere from 93-97%. Patient normally not on oxygen. Chest x-rays negative for pneumonia. Patient however does have chronic deformities to the chest area due to severe dextro scoliosis. This is probably interfering with his air movement at baseline and with the upper respiratory infection and bronchitis and reactive airway disease going on is probably exacerbated the situation.  Patient was treated with Tamiflu as if it could've been an influenza without any improvement over the last week.  Patient on oxygen is very stable alert cooperative in no acute distress. Patient given site Medrol here IV and 2 nebulizer treatments. Discussed with hospitalist they will admit.   Fredia Sorrow, MD 09/06/15 1932

## 2015-09-06 NOTE — ED Notes (Addendum)
EKG was not completed in triage and unable to obtain EKG at this time due to patient being in X-ray

## 2015-09-06 NOTE — ED Notes (Signed)
Pt. Is in x-ray and will come to [facility] B

## 2015-09-06 NOTE — ED Notes (Signed)
Pt given sandwich, applesauce and milk

## 2015-09-06 NOTE — ED Notes (Addendum)
Pt states he was seen at his PCP on Thursday for URI and again today. Sent here for low sats. O2 65% on RA. Alert and oriented.

## 2015-09-06 NOTE — H&P (Signed)
[facility] History and Physical  Timothy Vaughn [id number] DOB: Jun 30, 1929 DOA: 09/06/2015  Referring physician: ED PCP: Alesia Richards, MD   Chief Complaint: Shortness of breath  HPI:  Mr. 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 Patient had went to his primary care office approximately 1 week ago(12/29) with complaints of cough and wheezing. At that time evaluated and O2 sats were 89% on room air. His primary care had given him DuoNeb  treatments, Z-Pak, cough syrup, and they increased his Lasix to 80 mg in a.m. and 40 mg at night. Despite these changes patient notes that he was still having shortness of breath and a wet cough. He states that he is not able to bring anything up. Denies any recent sick contacts to his knowledge. Patient's son accompanied him today to his primary care office as he was not quite himself and somewhat confused and he is normally sharp. Associated symptoms included chills, malaise, While at his primary care office the patient was noted to have O2 sats of 64% on room air is recommended him to come immediately to the emergency room department at that time for further evaluation.  Upon admission into the emergency department the patient was requiring 4 L nasal cannula oxygen to maintain O2 sats at 95%. Initial chest x-ray showed no acute change from previous chest x-ray. His initial CBC was seen to be relatively within normal limits. Patient was noted to have a bicarbonate 44 was previously 35 back in November 2016.  Once patient was admitted to a telemetry floor was noted to have increased somnolence and decrease in O2 sats to the 80s requiring patient to be placed on a nonrebreather mask to maintain sats to 91%. A stat ABG was ordered which showed a pH of 7.23, PO2 of 64 , bicarb 44, and a PCO2  of 101. Transferring patient to stepdown for more acute monitoring and need of BiPAP.  Review of Systems  Constitutional: Positive for chills and malaise/fatigue.  HENT: Negative for ear discharge and ear pain.   Eyes: Negative for photophobia and pain.  Respiratory: Positive for cough, shortness of breath and wheezing. Negative for hemoptysis and sputum production.   Cardiovascular: Negative for chest pain and palpitations.  Gastrointestinal: Negative for vomiting and abdominal pain.  Genitourinary: Negative for frequency and hematuria.  Skin: Negative for itching and rash.  Neurological: Negative for dizziness and tremors.  Endo/Heme/Allergies: Negative for environmental allergies. Does not bruise/bleed easily.  Psychiatric/Behavioral: Negative for suicidal ideas and substance abuse.       Confusion       Past Medical History  Diagnosis Date  . Hypertension   . Hyperlipidemia   . Pre-diabetes   . Vitamin D deficiency   . Cancer [facility])     prostate     Past Surgical History  Procedure Laterality Date  . Prostatectomy  2001      Social History:  reports that he has never smoked. He does not have any smokeless tobacco history on file. He reports that he does not drink alcohol or use illicit drugs. Where does patient live--home   and with whom if at home? With wife Can patient participate in ADLs? Yes  Allergies  Allergen Reactions  . Lipitor [Atorvastatin] Other (See Comments)    Severe NIV  . Niaspan [Niacin Er] Itching    Family History  Problem Relation Age of Onset  . Heart disease Mother   . COPD Mother   . Cancer Father     colon  . Alzheimer's disease Father   . Heart disease Father   . Cancer Brother     stomach  . Stroke Maternal Grandfather   . Diabetes Maternal Grandfather         Prior to Admission medications   Medication Sig Start Date End Date Taking? Authorizing Provider  Albuterol Sulfate (PROAIR RESPICLICK) 123XX123 (90 BASE) MCG/ACT AEPB  Inhale 2 Act into the lungs every 6 (six) hours as needed (for shortness of breath). 01/04/15  Yes Vicie Mutters, PA-C  Ascorbic Acid (VITAMIN C) 100 MG tablet Take 100 mg by mouth daily.   Yes Historical Provider, MD  aspirin 81 MG tablet Take 81 mg by mouth daily.   Yes Historical Provider, MD  azithromycin (ZITHROMAX) 250 MG tablet Take 250 mg by mouth daily. ABT Start Date 09/01/15 & End Date 09/06/15. 09/01/15  Yes Historical Provider, MD  Cholecalciferol (VITAMIN D PO) Take 8,000 Int'l Units by mouth daily.    Yes Historical Provider, MD  Multiple Vitamin (MULTI VITAMIN DAILY PO) Take 1 tablet by mouth daily.    Yes Historical Provider, MD  Omega-3 Fatty Acids (FISH OIL PO) Take 1 capsule by mouth daily.    Yes Historical Provider, MD  potassium chloride SA (K-DUR,KLOR-CON) 20 MEQ tablet Take 2 tablets (40 mEq total) by mouth daily. 09/01/15  Yes Courtney Forcucci, PA-C  pravastatin (PRAVACHOL) 40 MG tablet TAKE 1 TABLET AT BEDTIME FOR CHOLESTEROL 06/27/14  Yes Unk Pinto, MD  predniSONE (DELTASONE) 20 MG tablet Take 20 mg by mouth daily.  08/01/15  Yes Historical Provider, MD  furosemide (LASIX) 40 MG tablet Take 40 mg by mouth daily as needed for fluid or edema.     Historical Provider, MD  promethazine-dextromethorphan (PROMETHAZINE-DM) 6.25-15 MG/5ML syrup Take 5 mL as needed q8hrs  for severe coughing 09/01/15   Starlyn Skeans, PA-C     Physical Exam: Filed Vitals:   09/06/15 1830 09/06/15 1900 09/06/15 1915 09/06/15 1930  BP: 141/87 160/73 165/69 165/69  Pulse: 79 77 80 42  Temp:      TempSrc:      Resp: 22 21 22 25   Height:      Weight:      SpO2: 95% 95% 93% 96%     Constitutional: Vital signs reviewed. Patient is a elderly male who is in some moderate respiratory distress. Alert to person with some mild confusion Head: Normocephalic and atraumatic  Ear: TM normal bilaterally  Mouth: no erythema or exudates, MMM  Eyes: PERRL, EOMI, conjunctivae normal, No scleral  icterus.  Neck: Supple, Trachea midline normal ROM, No JVD, mass, thyromegaly, or carotid bruit present.  Cardiovascular: RRR, S1 normal, S2 normal, no MRG, pulses symmetric and intact bilaterally  Pulmonary/Chest: Tachypneic Coarse rhonchi appreciated bilaterally with wheezes more on the right lung field and left Abdominal: Soft. Non-tender, non-distended, bowel sounds are normal, no masses, organomegaly, or guarding present.  GU: no CVA tenderness Musculoskeletal: No joint deformities, erythema, or stiffness, ROM full and no nontender Ext: no edema and no cyanosis, pulses palpable bilaterally (DP and PT)  Hematology: no cervical, inginal, or axillary adenopathy.  Neurological: Alert Strenght is normal and symmetric bilaterally, cranial nerve II-XII are grossly intact, no focal motor deficit, sensory intact to light touch bilaterally.  Skin: Warm, dry and intact. No rash, cyanosis, or clubbing.  Psychiatric: Normal mood and  affect. speech and behavior is normal. Judgment and thought content normal. Cognition and memory are normal.      Data Review   Micro Results No results found for this or any previous visit (from the past 240 hour(s)).  Radiology Reports Dg Chest 2 View  09/06/2015  CLINICAL DATA:  Short of breath, weakness, cough for 2 day EXAM: CHEST  2 VIEW COMPARISON:  05/20/2013 FINDINGS: There is severe deformity of the thorax and severe dextroscoliosis. Heart is enlarged. Chronic opacities in both mid and lower lung zones are likely related to volume loss. IMPRESSION: Chronic changes.  No interval change since the prior study. Electronically Signed   By: Marybelle Killings M.D.   On: 09/06/2015 15:56     CBC  Recent Labs Lab 09/06/15 1710  WBC 7.6  HGB 15.4  HCT 48.7  PLT 155  MCV 102.5*  MCH 32.4  MCHC 31.6  RDW 13.2  LYMPHSABS 1.4  MONOABS 1.0  EOSABS 0.0  BASOSABS 0.0    Chemistries   Recent Labs Lab 09/06/15 1710  NA 142  K 3.7  CL 92*  CO2 44*  GLUCOSE  146*  BUN 34*  CREATININE 1.02  CALCIUM 8.5*  AST 38  ALT 30  ALKPHOS 69  BILITOT 1.2   ------------------------------------------------------------------------------------------------------------------ estimated creatinine clearance is 45.1 mL/min (by C-G formula based on Cr of 1.02). ------------------------------------------------------------------------------------------------------------------ No results for input(s): HGBA1C in the last 72 hours. ------------------------------------------------------------------------------------------------------------------ No results for input(s): CHOL, HDL, LDLCALC, TRIG, CHOLHDL, LDLDIRECT in the last 72 hours. ------------------------------------------------------------------------------------------------------------------ No results for input(s): TSH, T4TOTAL, T3FREE, THYROIDAB in the last 72 hours.  Invalid input(s): FREET3 ------------------------------------------------------------------------------------------------------------------ No results for input(s): VITAMINB12, FOLATE, FERRITIN, TIBC, IRON, RETICCTPCT in the last 72 hours.  Coagulation profile No results for input(s): INR, PROTIME in the last 168 hours.  No results for input(s): DDIMER in the last 72 hours.  Cardiac Enzymes No results for input(s): CKMB, TROPONINI, MYOGLOBIN in the last 168 hours.  Invalid input(s): CK ------------------------------------------------------------------------------------------------------------------ Invalid input(s): POCBNP   CBG: No results for input(s): GLUCAP in the last 168 hours.     EKG: Independently reviewed. Right axis deviation with sinus rhythm   Assessment/Plan Principal Problem:   Acute respiratory failure with hypoxia and hypercapnia: Acute. Patient suspected to have baseline hypercapnia secondary to restrictive lung disease. However patient had acutely worsening of his shortness of breath ABG ordered showing pH  7.23, PO2 64, PCO2 101, and Bicarb 44. Question underlying pneumonia -Initially admitted to telemetry bed, but due to worsening respiratory status required transfer to the stepdown unit  - Continuous nasal cannula oxygen  - BiPAP stat - Recheck ABG in 2 hours of being on BiPAP  - Stat chest x-ray ordered - Sputum and blood cultures - Checking lactic acid level - Empiric antibiotics of Rocephin and azithromycin - DuoNeb's every 6 hours scheduled and prn q2hr - Methylprednisolone IV q12 - Consulted pulmonary critical care to help assist with respiratory status  Acute encephalopathy: Patient with an increasing somnolence seen on the floor. Suspect acute encephalopathy due to hypoxia and hypercapnia as seen above. - Neuro checks every 2 hours - Ordered CT of brain  Elevated BNP: Mildly elevated at 146 - Records stat Lasix 40 mg IV - Place Foley - Strict ins and outs  - may need to check echocardiogram in a.m.  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

## 2015-09-06 NOTE — Progress Notes (Signed)
Patient ID: Timothy Vaughn, male   DOB: 1928-12-02, 82 y.o.   MRN: [id number]    Very nice 82 yo MWM with severe restrictive lung Dz from severe scoliosis was seen 5 days ago for an AECB with Asthma and also had been off of his diuretics  X 10 days while had out of town guests. O2 sat was 90 % then and patient was tx'd with Abx's and increased lasix with 6 # wt loss in the interim. Today he has increased wheezingh and dyspnea and drop of  O2 sat 64%.  Patient was advised hospitalization and is routed to the hospital with transport by his son.   Imp: suspect Bilat bronchopneumonia and possible superimposed CHF

## 2015-09-06 NOTE — Progress Notes (Deleted)
Subjective:     Patient ID: Timothy Vaughn, male   DOB: 03/20/1929, 82 y.o.   MRN: [id number]  HPI  Very nice 82 yo MWM with severe restrictive lung Dz from severe scoliosis was seen 5 days ago for an AECB with Asthma and also had been off of his diuretics  X 10 days while had out of town guests. O2 sat was 90 % then and patient was tx'd with Abx's and increased lasix with 6 # wt loss in the interim. Today he has increased wheezingh and dyspnea and drop of  O2 sat 64%.  Patient was advised hospitalization and is routed to the hospital with transport by his son.   Review of Systems     Objective:   Physical Exam     Assessment:     ***    Plan:     ***

## 2015-09-07 ENCOUNTER — Inpatient Hospital Stay (HOSPITAL_COMMUNITY): Payer: Medicare Other

## 2015-09-07 DIAGNOSIS — G9341 Metabolic encephalopathy: Secondary | ICD-10-CM | POA: Diagnosis present

## 2015-09-07 DIAGNOSIS — J984 Other disorders of lung: Secondary | ICD-10-CM

## 2015-09-07 DIAGNOSIS — L8990 Pressure ulcer of unspecified site, unspecified stage: Secondary | ICD-10-CM | POA: Diagnosis present

## 2015-09-07 DIAGNOSIS — I509 Heart failure, unspecified: Secondary | ICD-10-CM

## 2015-09-07 DIAGNOSIS — J9601 Acute respiratory failure with hypoxia: Secondary | ICD-10-CM

## 2015-09-07 DIAGNOSIS — J189 Pneumonia, unspecified organism: Principal | ICD-10-CM

## 2015-09-07 DIAGNOSIS — J9602 Acute respiratory failure with hypercapnia: Secondary | ICD-10-CM

## 2015-09-07 DIAGNOSIS — J40 Bronchitis, not specified as acute or chronic: Secondary | ICD-10-CM

## 2015-09-07 DIAGNOSIS — R7989 Other specified abnormal findings of blood chemistry: Secondary | ICD-10-CM | POA: Diagnosis present

## 2015-09-07 DIAGNOSIS — M419 Scoliosis, unspecified: Secondary | ICD-10-CM

## 2015-09-07 HISTORY — DX: Pneumonia, unspecified organism: J18.9

## 2015-09-07 HISTORY — DX: Acute respiratory failure with hypoxia: J96.02

## 2015-09-07 HISTORY — DX: Acute respiratory failure with hypercapnia: J96.01

## 2015-09-07 LAB — MRSA PCR SCREENING: MRSA BY PCR: NEGATIVE

## 2015-09-07 LAB — CBC
HEMATOCRIT: 51.7 % (ref 39.0–52.0)
Hemoglobin: 16 g/dL (ref 13.0–17.0)
MCH: 32.3 pg (ref 26.0–34.0)
MCHC: 30.9 g/dL (ref 30.0–36.0)
MCV: 104.4 fL — ABNORMAL HIGH (ref 78.0–100.0)
PLATELETS: 149 10*3/uL — AB (ref 150–400)
RBC: 4.95 MIL/uL (ref 4.22–5.81)
RDW: 13.2 % (ref 11.5–15.5)
WBC: 5.7 10*3/uL (ref 4.0–10.5)

## 2015-09-07 LAB — BLOOD GAS, ARTERIAL
ACID-BASE EXCESS: 11 mmol/L — AB (ref 0.0–2.0)
Acid-Base Excess: 9.2 mmol/L — ABNORMAL HIGH (ref 0.0–2.0)
BICARBONATE: 41.1 meq/L — AB (ref 20.0–24.0)
Bicarbonate: 41.8 mEq/L — ABNORMAL HIGH (ref 20.0–24.0)
DRAWN BY: 422461
Delivery systems: POSITIVE
Drawn by: 232811
EXPIRATORY PAP: 8
FIO2: 0.5
FIO2: 0.55
INSPIRATORY PAP: 15
O2 CONTENT: 15 L/min
O2 SAT: 97.3 %
O2 Saturation: 88.7 %
PATIENT TEMPERATURE: 98.2
PCO2 ART: 81.9 mmHg — AB (ref 35.0–45.0)
PH ART: 7.32 — AB (ref 7.350–7.450)
PO2 ART: 104 mmHg — AB (ref 80.0–100.0)
PO2 ART: 64.9 mmHg — AB (ref 80.0–100.0)
Patient temperature: 97.8
TCO2: 36.4 mmol/L (ref 0–100)
TCO2: 37.9 mmol/L (ref 0–100)
pCO2 arterial: 101 mmHg (ref 35.0–45.0)
pH, Arterial: 7.237 — ABNORMAL LOW (ref 7.350–7.450)

## 2015-09-07 LAB — STREP PNEUMONIAE URINARY ANTIGEN: STREP PNEUMO URINARY ANTIGEN: NEGATIVE

## 2015-09-07 LAB — INFLUENZA PANEL BY PCR (TYPE A & B)
H1N1 flu by pcr: NOT DETECTED
INFLAPCR: NEGATIVE
Influenza B By PCR: NEGATIVE

## 2015-09-07 LAB — BASIC METABOLIC PANEL
ANION GAP: 12 (ref 5–15)
BUN: 29 mg/dL — ABNORMAL HIGH (ref 6–20)
CALCIUM: 8.6 mg/dL — AB (ref 8.9–10.3)
CO2: 42 mmol/L — AB (ref 22–32)
Chloride: 91 mmol/L — ABNORMAL LOW (ref 101–111)
Creatinine, Ser: 1 mg/dL (ref 0.61–1.24)
Glucose, Bld: 248 mg/dL — ABNORMAL HIGH (ref 65–99)
Potassium: 4 mmol/L (ref 3.5–5.1)
Sodium: 145 mmol/L (ref 135–145)

## 2015-09-07 LAB — PROCALCITONIN: Procalcitonin: <0.1 ng/mL

## 2015-09-07 LAB — TROPONIN I: Troponin I: <0.03 ng/mL (ref <0.031)

## 2015-09-07 LAB — LACTIC ACID, PLASMA
LACTIC ACID, VENOUS: 2.5 mmol/L — AB (ref 0.5–2.0)
Lactic Acid, Venous: 1.5 mmol/L (ref 0.5–2.0)
Lactic Acid, Venous: 1.3 mmol/L (ref 0.5–2.0)

## 2015-09-07 MED ORDER — FUROSEMIDE 10 MG/ML IJ SOLN
40.0000 mg | Freq: Four times a day (QID) | INTRAMUSCULAR | Status: AC
  Administered 2015-09-07 (×2): 40 mg via INTRAVENOUS
  Filled 2015-09-07 (×2): qty 4

## 2015-09-07 MED ORDER — LIP MEDEX EX OINT
TOPICAL_OINTMENT | CUTANEOUS | Status: AC
  Administered 2015-09-07: 1
  Filled 2015-09-07: qty 7

## 2015-09-07 MED ORDER — FAMOTIDINE IN NACL 20-0.9 MG/50ML-% IV SOLN
20.0000 mg | Freq: Two times a day (BID) | INTRAVENOUS | Status: DC
  Administered 2015-09-07 – 2015-09-08 (×4): 20 mg via INTRAVENOUS
  Filled 2015-09-07 (×5): qty 50

## 2015-09-07 MED ORDER — FUROSEMIDE 10 MG/ML IJ SOLN
40.0000 mg | INTRAMUSCULAR | Status: AC
  Administered 2015-09-07: 40 mg via INTRAVENOUS
  Filled 2015-09-07: qty 4

## 2015-09-07 MED ORDER — LIP MEDEX EX OINT: TOPICAL_OINTMENT | CUTANEOUS | Status: DC | PRN

## 2015-09-07 NOTE — Progress Notes (Signed)
  Echocardiogram 2D Echocardiogram has been performed.  Diamond Nickel 09/07/2015, 3:39 PM

## 2015-09-07 NOTE — Consult Note (Signed)
Name: Timothy Vaughn MRN: [id number] DOB: 1929/04/04    ADMISSION DATE:  09/06/2015 CONSULTATION DATE:  09/06/15  REFERRING MD :  Tamala Julian  CHIEF COMPLAINT:  SOB  BRIEF PATIENT DESCRIPTION: 82 y.o. male with restrictive lung disease due to severe kyphoscoliosis, brought to [facility] ED 01/03 due to persistent URI symptoms x 1 week.  Saw PCP and was treated with abx, BD's, cough suppressants.  Admitted by [facility] for hypoxic and hypercarbic resp failure and after admission, remained hypersomnolent; therefore transferred to ICU and  PCCM called for further recs.  SIGNIFICANT EVENTS  09/01/15 > initially saw PCP, given BD's, z-pack, cough suppressant. 01/04 > back to PCP for persistent symptoms > sent to ED. 01/014 > admitted with hypoxic and hypercarbic respiratory failure > transferred to ICU for BiPAP and possible intubation.  STUDIES:  CXR 01/03 > no acute process. Severe dextroscoliosis.  HISTORY OF PRESENT ILLNESS:  Timothy Vaughn is a 82 y.o. male with a PMH as outlined below including restrictive lung disease secondary to severe kyphoscoliosis.  He had been experiencing URI symptoms for roughly a week, saw his PCP and was placed on BD's, Z-pack, cough suppressants.  His lasix was also increased.  Despite changes, pt's SOB and cough persisted.  This prompted his son to take him back to PCP office 01/04 where he was found to be somewhat confused and had SpO2 of 64%.  PCP advised him to go to ED for further evaluation.  In ED, he was found to have combined hypoxemic and hypercarbic respiratory failure.  He was admitted by [facility] for further management.  After admission, he had been progressively hypersomnolent and developed worsening hypoxia to the point he required NRB.  He was subsequently transferred to ICU for closer monitoring.  PCCM was consulted for consideration NIMV and possibility that he may require intubation.  PAST MEDICAL HISTORY :   has a past medical history of Hypertension;  Hyperlipidemia; Pre-diabetes; Vitamin D deficiency; and Cancer ([place]).  has past surgical history that includes Prostatectomy (2001). Prior to Admission medications   Medication Sig Start Date End Date Taking? Authorizing Provider  Albuterol Sulfate (PROAIR RESPICLICK) 123XX123 (90 BASE) MCG/ACT AEPB Inhale 2 Act into the lungs every 6 (six) hours as needed (for shortness of breath). 01/04/15  Yes Vicie Mutters, PA-C  Ascorbic Acid (VITAMIN C) 100 MG tablet Take 100 mg by mouth daily.   Yes Historical Provider, MD  aspirin 81 MG tablet Take 81 mg by mouth daily.   Yes Historical Provider, MD  azithromycin (ZITHROMAX) 250 MG tablet Take 250 mg by mouth daily. ABT Start Date 09/01/15 & End Date 09/06/15. 09/01/15  Yes Historical Provider, MD  Cholecalciferol (VITAMIN D PO) Take 8,000 Int'l Units by mouth daily.    Yes Historical Provider, MD  Multiple Vitamin (MULTI VITAMIN DAILY PO) Take 1 tablet by mouth daily.    Yes Historical Provider, MD  Omega-3 Fatty Acids (FISH OIL PO) Take 1 capsule by mouth daily.    Yes Historical Provider, MD  potassium chloride SA (K-DUR,KLOR-CON) 20 MEQ tablet Take 2 tablets (40 mEq total) by mouth daily. 09/01/15  Yes Courtney Forcucci, PA-C  pravastatin (PRAVACHOL) 40 MG tablet TAKE 1 TABLET AT BEDTIME FOR CHOLESTEROL 06/27/14  Yes Unk Pinto, MD  predniSONE (DELTASONE) 20 MG tablet Take 20 mg by mouth daily.  08/01/15  Yes Historical Provider, MD  furosemide (LASIX) 40 MG tablet Take 40 mg by mouth daily as needed for fluid or edema.  Historical Provider, MD  promethazine-dextromethorphan (PROMETHAZINE-DM) 6.25-15 MG/5ML syrup Take 5 mL as needed q8hrs  for severe coughing 09/01/15   Starlyn Skeans, PA-C   Allergies  Allergen Reactions  . Lipitor [Atorvastatin] Other (See Comments)    Severe NIV  . Niaspan [Niacin Er] Itching    FAMILY HISTORY:  family history includes Alzheimer's disease in his father; COPD in his mother; Cancer in his brother and father;  Diabetes in his maternal grandfather; Heart disease in his father and mother; Stroke in his maternal grandfather. SOCIAL HISTORY:  reports that he has never smoked. He does not have any smokeless tobacco history on file. He reports that he does not drink alcohol or use illicit drugs.  REVIEW OF SYSTEMS:   Unable to obtain while on BiPAP and confused As noted on H&P   SUBJECTIVE:   VITAL SIGNS: Temp:  [97.6 F (36.4 C)-98.4 F (36.9 C)] 97.8 F (36.6 C) (01/03 2146) Pulse Rate:  [37-92] 78 (01/03 2146) Resp:  [18-28] 18 (01/03 2146) BP: (118-165)/(46-132) 136/94 mmHg (01/03 2146) SpO2:  [64 %-100 %] 93 % (01/03 2354) FiO2 (%):  [55 %] 55 % (01/03 2354) Weight:  [63.05 kg (139 lb)-64.411 kg (142 lb)] 63.05 kg (139 lb) (01/03 2146)  PHYSICAL EXAMINATION: Gen. Pleasant, well-nourished, in moderate distress, agitated affect on BiPAP ENT - no lesions, no post nasal drip Neck: No JVD, no thyromegaly, no carotid bruits Lungs: no use of accessory muscles, no dullness to percussion, clear without rales or rhonchi , severe kyphoscoliosis Cardiovascular: Rhythm regular, heart sounds  normal, no murmurs, no peripheral edema Abdomen: soft and non-tender, no hepatosplenomegaly, BS normal. Musculoskeletal: No deformities, no cyanosis or clubbing Neuro:  alert, non focal Skin:  Warm, no lesions/ rash    Recent Labs Lab 09/06/15 1710  NA 142  K 3.7  CL 92*  CO2 44*  BUN 34*  CREATININE 1.02  GLUCOSE 146*    Recent Labs Lab 09/06/15 1710  HGB 15.4  HCT 48.7  WBC 7.6  PLT 155   Dg Chest 2 View  09/06/2015  CLINICAL DATA:  Short of breath, weakness, cough for 2 day EXAM: CHEST  2 VIEW COMPARISON:  05/20/2013 FINDINGS: There is severe deformity of the thorax and severe dextroscoliosis. Heart is enlarged. Chronic opacities in both mid and lower lung zones are likely related to volume loss. IMPRESSION: Chronic changes.  No interval change since the prior study. Electronically Signed    By: Marybelle Killings M.D.   On: 09/06/2015 15:56    ASSESSMENT / PLAN:  Acute on chronic hypercarbic respiratory failure - suspect this is directly related to his severe kyposcoliosis which likely affects his ability to ventilate adequately.  Unknown whether he has hypoxia at baseline (however, he has not required O2 at rest in the past that we know of). Doubt PNA - no hx to support, no fever and no leukocytosis.  there does appear to be some air space disease bilaterally which may reflect pulmonary edema , concern for biventricular failure  Negative flu PCR noted. Plan: Start BiPAP. Repeat ABG in am Supplemental O2 when off BiPAP to maintain SpO2 > 92%. Albuterol PRN. Pulmonary hygiene. Assess PCT - if high, consider abx. CXR in AM. assess echo for right ventricular failure  Acute hypercarbic encephalopathy -related to hypercarbia  Plan: Does not need head CT since he is nonfocal   Haldol when necessary agitation  HTN, HLD. Plan: Continue outpatient lasix, pravastatin, ASA.  check troponin 1  Hypocalcemia. Plan:  Assess ionized calcium in AM. Correct electrolytes as indicated    Addendum -reassessed after a few minutes of BiPAP , his work of breathing is decreased , he was trying to speak through the BiPAP mask , wanted the mask off but after explaining -agree to keep it on until morning .he is diuresing briskly with Lasix    The patient is critically ill with multiple organ systems failure and requires high complexity decision making for assessment and support, frequent evaluation and titration of therapies, application of advanced monitoring technologies and extensive interpretation of multiple databases. Critical Care Time devoted to patient care services described in this note independent of APP time is 40 minutes.    [place]

## 2015-09-07 NOTE — Progress Notes (Signed)
09/07/15  Patient c/o pain with foley. MD notified. Per MD d/c foley and use condom cath if neccessary.

## 2015-09-07 NOTE — Progress Notes (Signed)
09/07/15  1845  Removed foley Cath. Patient started to bleed from penis. Paged Dr Sanjuana Letters to notify about the bleeding. Per MD continue to monitor bleeding and urine output. If patient start to experience retention may need to get urology to place foley. Patient urinated 156ml after foley was removed. Will continue to monitor.

## 2015-09-07 NOTE — Progress Notes (Signed)
Patient is resting comfortably on 5L [place] of humidified oxygen at this time and is in no distress. BIPAP is not needed at this time. RT will continue to monitor.

## 2015-09-07 NOTE — Progress Notes (Signed)
eLink Physician-Brief Progress Note Patient Name: DONQUARIUS COLEE DOB: 10-Jan-1929 MRN: [id number]   Date of Service  09/07/2015  HPI/Events of Note  82 yo man with restrictive lung disease due to severe kyphoscoliosis. Admitted with dyspnea and confusion. Has been progressively lethargic. Moved to SDU for further care. On camera eval he is awake but is confused slightly agitated. He is perseverating, repeating same phrase.     Recent Labs Lab 09/07/15 0020  PHART 7.237*  PCO2ART 101*  PO2ART 64.9*  HCO3 41.8*  TCO2 37.9  O2SAT 88.7     eICU Interventions  Would like to try him on BiPAp to see how he tolerates and improves. Suspect he may need haldol or other med to tolerate, but clearly do not want to oversedate and compound hypoventilation, decrease airway protection.  Discussed case with Dr Tamala Julian, [facility] and with Dr Elsworth Soho who will see pt this pm to eval for response to NIPPV     Intervention Category Intermediate Interventions: Respiratory distress - evaluation and management;Change in mental status - evaluation and management  Kallan Merrick S. 09/07/2015, 1:00 AM

## 2015-09-07 NOTE — Progress Notes (Signed)
Rapid Response called to room 1434 at 00:08, Nurse reports pt had increased work of breathing, change in mental status and low oxygen saturation in the 80's. Upon arrival at 00:10  pt found on venti mask with some improvement in oxygen saturation up to 94%. Patient remained lethargic. MD present at bedside orders received for stat blood gas, labs and lasix. On Auscultation lungs are coarse with crackles. Pupils reactive to light right pupil dilated 71mm and sluggish and left pupil 38mm and brisk. Per Floor nurse pupils were this way on admission. Pt transferred to stepdown level of care at 00:45

## 2015-09-07 NOTE — Progress Notes (Signed)
Inpatient Rehabilitation  Patient was screened by Brienne Liguori for appropriateness for an Inpatient Acute Rehab consult.  At this time, we are recommending Inpatient Rehab consult.  Please order when you feel appropriate.   Pantera Winterrowd PT Inpatient Rehab Admissions Coordinator Cell [phone] Office [phone]   

## 2015-09-07 NOTE — Progress Notes (Signed)
Pt placed on BiPAP through noninvasive mode on the ventilator.   Settings PC above PEEP - 7 and PEEP of 8, back up rate of 15 breaths per minute, and 60% FiO2.  Pt very agitated and confused at this time.  Pt calms when spoken to about his situation but then promptly becomes confused again.  Unable to do any form of chest physiotherapy at this time.  RT will continue to monitor as needed.

## 2015-09-07 NOTE — Progress Notes (Signed)
Name: Timothy Vaughn MRN: [id number] DOB: [birth year]    ADMISSION DATE:  09/06/2015 CONSULTATION DATE:  09/06/15  REFERRING MD :  Tamala Julian  CHIEF COMPLAINT:  SOB  BRIEF PATIENT DESCRIPTION: 82 y.o. male with restrictive lung disease due to severe kyphoscoliosis, brought to [facility] ED 01/03 due to persistent URI symptoms x 1 week.  Saw PCP and was treated with abx, BD's, cough suppressants.  Admitted by [facility] for hypoxic and hypercarbic resp failure and after admission, remained hypersomnolent; therefore transferred to ICU and  PCCM called for further recs.  SIGNIFICANT EVENTS  09/01/15 > initially saw PCP, given BD's, z-pack, cough suppressant. 01/04 > back to PCP for persistent symptoms > sent to ED. 01/014 > admitted with hypoxic and hypercarbic respiratory failure > transferred to ICU for BiPAP and possible intubation.  STUDIES:  CXR 01/03 > no acute process. Severe dextroscoliosis.   SUBJECTIVE: Better this morning, slept on BIPAP, more awake and alert  VITAL SIGNS: Temp:  [97.4 F (36.3 C)-98.4 F (36.9 C)] 97.4 F (36.3 C) (01/04 0800) Pulse Rate:  [25-92] 25 (01/04 0800) Resp:  [18-32] 20 (01/04 0800) BP: (114-173)/(32-132) 114/90 mmHg (01/04 0800) SpO2:  [64 %-100 %] 95 % (01/04 0800) FiO2 (%):  [40 %-60 %] 40 % (01/04 0410) Weight:  [139 lb (63.05 kg)-142 lb (64.411 kg)] 139 lb (63.05 kg) (01/03 2146)  PHYSICAL EXAMINATION: Gen: comfortable on BIPAP HENT: BIPAP mask in place, MMM, OP clear PULM: Rhonchi bilaterally, normal effort CV: Difficult exam due to ronchi from lungs, JVD elevated, no clear mgr GI: BS+, soft, nontender MSK: normal bulk and tone Derm: no rash or skin breakdown Neuro: A&Ox4, maew    Recent Labs Lab 09/06/15 1710 09/07/15 0105  NA 142 145  K 3.7 4.0  CL 92* 91*  CO2 44* 42*  BUN 34* 29*  CREATININE 1.02 1.00  GLUCOSE 146* 248*    Recent Labs Lab 09/06/15 1710 09/07/15 0105  HGB 15.4 16.0  HCT 48.7 51.7  WBC 7.6 5.7  PLT 155 149*    Dg Chest 2 View  09/06/2015  CLINICAL DATA:  Short of breath, weakness, cough for 2 day EXAM: CHEST  2 VIEW COMPARISON:  05/20/2013 FINDINGS: There is severe deformity of the thorax and severe dextroscoliosis. Heart is enlarged. Chronic opacities in both mid and lower lung zones are likely related to volume loss. IMPRESSION: Chronic changes.  No interval change since the prior study. Electronically Signed   By: Marybelle Killings M.D.   On: 09/06/2015 15:56   Dg Chest Port 1 View  09/07/2015  CLINICAL DATA:  Acute onset of congested cough. Altered mental status. Initial encounter. EXAM: PORTABLE CHEST 1 VIEW COMPARISON:  Chest radiograph performed 09/06/2015 FINDINGS: Evaluation is significantly suboptimal due to patient rotation and the degree of left convex thoracic scoliosis. Patchy bilateral airspace opacities could reflect pneumonia or mild pulmonary edema. No pleural effusion or pneumothorax is seen. The cardiomediastinal silhouette is normal in size. No acute osseous abnormalities are identified. There is chronic bilateral rib deformity, reflecting underlying severe thoracic scoliosis. IMPRESSION: Patchy bilateral airspace opacities could reflect pneumonia or mild pulmonary edema Electronically Signed   By: Garald Balding M.D.   On: 09/07/2015 01:22    ASSESSMENT / PLAN:  Acute on chronic hypercarbic respiratory failure - pulmonary edema most likely based on exam and history of swelling; doubt pneumonias no fever or WBC; agree severe kyphosis causing chronic hypercapnea previously undiagnosed May have acute bronchitis based on history Better after taking  BIPAP overnight Plan: BIPAP qHS (mandatory sleep and with nap) Continue antibiotics for now Will give lasix x2 doses based on exam Echo today Will likely need BIPAP titration study as outpatient  Repeat ABG in am Supplemental O2 when off BiPAP to maintain SpO2 > 92% Albuterol PRN Pulmonary hygiene  Acute hypercarbic encephalopathy -related  to hypercarbia > improved Plan: Treat hypercarbia No sedating meds  HTN, HLD Plan: Continue outpatient lasix, pravastatin, ASA  check troponin 1  Hypocalcemia Plan: Correct electrolytes as indicated  Monitor in ICU, improved but still needs BIPAP  Family updated bedside by me  Additional CC time 40 minutes  Roselie Awkward, MD [place] PCCM Pager: [phone] Cell: [phone] After 3pm or if no response, call [phone]

## 2015-09-07 NOTE — Care Management Note (Signed)
Case Management Note  Patient Details  Name: Timothy Vaughn MRN: [id number] Date of Birth: [birth year]  Subjective/Objective:        resp distress and bipap            Action/Plan:Date: September 07, 2015 Chart reviewed for concurrent status and case management needs. Will continue to follow patient for changes and needs: Velva Harman, RN, BSN, [place]   [phone]   Expected Discharge Date:                  Expected Discharge Plan:  Home/Self Care  In-House Referral:  NA  Discharge planning Services  CM Consult  Post Acute Care Choice:    Choice offered to:     DME Arranged:    DME Agency:     HH Arranged:    HH Agency:     Status of Service:  In process, will continue to follow  Medicare Important Message Given:    Date Medicare IM Given:    Medicare IM give by:    Date Additional Medicare IM Given:    Additional Medicare Important Message give by:     If discussed at [place] of Stay Meetings, dates discussed:    Additional Comments:  Leeroy Cha, RN 09/07/2015, 1:03 PM

## 2015-09-07 NOTE — Evaluation (Addendum)
Physical Therapy Evaluation Patient Details Name: Timothy Vaughn MRN: [id number] DOB: February 05, 1929 Today's Date: 09/07/2015   History of Present Illness  82 y.o. male with restrictive lung disease due to severe kyphoscoliosis, brought to [facility] ED 01/03 due to persistent URI symptoms x 1 week.  Admitted  for hypoxic and hypercarbic resp failure to SDU requiring BiPAP.  Clinical Impression  Pt admitted with above diagnosis. Pt currently with functional limitations due to the deficits listed below (see PT Problem List).  Pt will benefit from skilled PT to increase their independence and safety with mobility to allow discharge to the venue listed below. Patient is very pleasant. 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  Patient's sats on 5 liters during mobility .90%, HR  60's. Dyspnea 2-3/4.        Follow Up Recommendations CIR/ SNF;Supervision/Assistance - 24 hour    Equipment Recommendations  None recommended by PT    Recommendations for Other Services OT consult     Precautions / Restrictions Precautions Precautions: Fall Precaution Comments: monitor sats and HR, on Oxygen      Mobility  Bed Mobility Overal bed mobility: Needs Assistance Bed Mobility: Supine to Sit     Supine to sit: Min assist;HOB elevated     General bed mobility comments: assist for trunk to upright, able to scoot to the edge.  Transfers Overall transfer level: Needs assistance Equipment used: 1 person hand held assist Transfers: Sit to/from [facility] Sit to Stand: Mod assist Stand pivot transfers: Mod assist       General transfer comment: assist for balance, multimodal cues, small steps to recliner.   Ambulation/Gait                Stairs            Wheelchair Mobility    Modified Rankin (Stroke Patients Only)       Balance Overall balance assessment: Needs assistance Sitting-balance support: Feet supported;Bilateral upper extremity supported Sitting  balance-Leahy Scale: Good     Standing balance support: During functional activity;Bilateral upper extremity supported Standing balance-Leahy Scale: Fair                               Pertinent Vitals/Pain Pain Assessment: No/denies pain    Home Living Family/patient expects to be discharged to:: Private residence Living Arrangements: Spouse/significant other Available Help at Discharge: Family;Available PRN/intermittently Type of Home: House Home Access: Stairs to enter Entrance Stairs-Rails: Right Entrance Stairs-Number of Steps: 6-garage Home Layout: One level Home Equipment: None Additional Comments: patient is primary caregiver of wife who had a stroke and has poor balance,     Prior Function Level of Independence: Independent               Hand Dominance        Extremity/Trunk Assessment   Upper Extremity Assessment: Generalized weakness           Lower Extremity Assessment: Generalized weakness      Cervical / Trunk Assessment: Kyphotic;Other exceptions  Communication   Communication: HOH  Cognition Arousal/Alertness: Awake/alert Behavior During Therapy: WFL for tasks assessed/performed Overall Cognitive Status: Within Functional Limits for tasks assessed                      General Comments      Exercises        Assessment/Plan  PT Assessment  PT Diagnosis  PT Problem  List  PT Treatment Interventions  PT Goals (Current goals can be found in the Care Plan section)  Frequency  Barriers to discharge    Co-evaluation               End of Session Equipment Utilized During Treatment: Oxygen Activity Tolerance: Patient tolerated treatment well Patient left: in chair;with call bell/phone within reach;with chair alarm set;with family/visitor present Nurse Communication: Mobility status         Time: [id number] PT Time Calculation (min) (ACUTE ONLY): 26 min   Charges:   PT Evaluation $PT Eval Moderate  Complexity: 1 Procedure PT Treatments $Therapeutic Activity: 8-22 mins   PT G Codes:        Claretha Cooper 09/07/2015, 2:52 PM  Tresa Endo PT [phone]

## 2015-09-07 NOTE — Progress Notes (Addendum)
[facility] PROGRESS NOTE  Timothy Vaughn [id number] DOB: December 14, 1928 DOA: 09/06/2015 PCP: Alesia Richards, MD  Summary I have seen and examined Timothy Vaughn at bedside and reviewed his chart. Appreciate pulmonary. Timothy Vaughn is a pleasant 82 year old male with restrictive lung disease secondary to severe kyphoscoliosis; who presented from his primary care office with shortness of breath and confusion he was found to have acute hypoxic/hypercapnic respiratory failure possibly related to community-acquired pneumonia, resulting in acute metabolic encephalopathy. There is question whether his respiratory failure is related to worsening kyphoscoliosis. He is currently on broad-spectrum antibiotics/systemic steroids/BiPAP/bronchodilators. He will get 2-D echocardiogram today. He seems more lucid and is not sure how he ended up in the hospital. He remains nothing by mouth on BiPAP. Will continue current management with pulmonary help. Plan Acute respiratory failure with hypoxia and hypercapnia (HCC)/Kyphoscoliosis/Restrictive lung disease due to kyphoscoliosis/Hypoxia/CAP (community acquired pneumonia)/Encephalopathy, metabolic/Elevated brain natriuretic peptide (BNP) level  Follow 2-D echo  Day 2 Ceftriaxone/Zithromax  Pepcid  Defer adjustment of systemic steroids/bronchodilators/oxygen supplementation to pulmonary Essential hypertension/Hyperlipidemia  No acute changes  Continue current management Pressure ulcer  Wound care as needed Code Status: Full Code Family Communication: Called patient's spouse Inez Catalina at [phone] and left a message, and son Lanny Hurst at [phone], and left message. Spoke with Lanny Hurst; he called back. Disposition Plan: TBD depending on clinical progression   Consultants:  Pulmonary  Procedures:  BiPAP  Antibiotics:  Ceftriaxone 09/06/2015>  Azithromycin 09/06/2015>  HPI/Subjective: Patient somewhat disoriented, trying to understand  what is happening around him. Denies any complaints.  Objective: Filed Vitals:   09/07/15 0730 09/07/15 0800  BP: 118/32 114/90  Pulse: 42 25  Temp:  97.4 F (36.3 C)  Resp: 19 20    Intake/Output Summary (Last 24 hours) at 09/07/15 0913 Last data filed at 09/07/15 0500  Gross per 24 hour  Intake    800 ml  Output    870 ml  Net    -70 ml   Filed Weights   09/06/15 1642 09/06/15 2146  Weight: 63.504 kg (140 lb) 63.05 kg (139 lb)    Exam:   General:  Comfortable at rest.  Cardiovascular: S1-S2 normal. No murmurs. Pulse regular.  Respiratory: Good air entry bilaterally. Scant rhonchi bilaterally.  Abdomen: Soft and nontender. Normal bowel sounds. No organomegaly.  Musculoskeletal: No pedal edema   Neurological: Intact  Data Reviewed: Basic Metabolic Panel:  Recent Labs Lab 09/06/15 1710 09/07/15 0105  NA 142 145  K 3.7 4.0  CL 92* 91*  CO2 44* 42*  GLUCOSE 146* 248*  BUN 34* 29*  CREATININE 1.02 1.00  CALCIUM 8.5* 8.6*   Liver Function Tests:  Recent Labs Lab 09/06/15 1710  AST 38  ALT 30  ALKPHOS 69  BILITOT 1.2  PROT 7.0  ALBUMIN 3.9   No results for input(s): LIPASE, AMYLASE in the last 168 hours. No results for input(s): AMMONIA in the last 168 hours. CBC:  Recent Labs Lab 09/06/15 1710 09/07/15 0105  WBC 7.6 5.7  NEUTROABS 5.3  --   HGB 15.4 16.0  HCT 48.7 51.7  MCV 102.5* 104.4*  PLT 155 149*   Cardiac Enzymes:  Recent Labs Lab 09/07/15 0310  TROPONINI <0.03   BNP (last 3 results)  Recent Labs  09/06/15 1558  BNP 145.9*    ProBNP (last 3 results) No results for input(s): PROBNP in the last 8760 hours.  CBG: No results for input(s): GLUCAP in the last 168 hours.  No results found for this  or any previous visit (from the past 240 hour(s)).   Studies: Dg Chest 2 View  09/06/2015  CLINICAL DATA:  Short of breath, weakness, cough for 2 day EXAM: CHEST  2 VIEW COMPARISON:  05/20/2013 FINDINGS: There is severe  deformity of the thorax and severe dextroscoliosis. Heart is enlarged. Chronic opacities in both mid and lower lung zones are likely related to volume loss. IMPRESSION: Chronic changes.  No interval change since the prior study. Electronically Signed   By: Marybelle Killings M.D.   On: 09/06/2015 15:56   Dg Chest Port 1 View  09/07/2015  CLINICAL DATA:  Acute onset of congested cough. Altered mental status. Initial encounter. EXAM: PORTABLE CHEST 1 VIEW COMPARISON:  Chest radiograph performed 09/06/2015 FINDINGS: Evaluation is significantly suboptimal due to patient rotation and the degree of left convex thoracic scoliosis. Patchy bilateral airspace opacities could reflect pneumonia or mild pulmonary edema. No pleural effusion or pneumothorax is seen. The cardiomediastinal silhouette is normal in size. No acute osseous abnormalities are identified. There is chronic bilateral rib deformity, reflecting underlying severe thoracic scoliosis. IMPRESSION: Patchy bilateral airspace opacities could reflect pneumonia or mild pulmonary edema Electronically Signed   By: Garald Balding M.D.   On: 09/07/2015 01:22    Scheduled Meds: . aspirin  81 mg Oral Daily  . azithromycin  500 mg Intravenous Q24H  . cefTRIAXone (ROCEPHIN)  IV  1 g Intravenous Q24H  . cholecalciferol  1,000 Units Oral Daily  . enoxaparin (LOVENOX) injection  40 mg Subcutaneous Q24H  . guaiFENesin  600 mg Oral BID  . ipratropium-albuterol  3 mL Nebulization Q6H  . methylPREDNISolone (SOLU-MEDROL) injection  60 mg Intravenous Q12H  . multivitamin with minerals  1 tablet Oral Daily  . omega-3 acid ethyl esters  1 g Oral Daily  . potassium chloride SA  40 mEq Oral Daily  . pravastatin  40 mg Oral q1800  . vitamin C  125 mg Oral Daily   Continuous Infusions:    Time spent: 25 minutes    Timothy Vaughn  [facility] Pager [phone]. If 7PM-7AM, please contact night-coverage at [email or web address], password [facility] 09/07/2015, 9:13 AM  LOS: 1  day

## 2015-09-08 ENCOUNTER — Inpatient Hospital Stay (HOSPITAL_COMMUNITY): Payer: Medicare Other

## 2015-09-08 DIAGNOSIS — D7589 Other specified diseases of blood and blood-forming organs: Secondary | ICD-10-CM | POA: Diagnosis present

## 2015-09-08 DIAGNOSIS — G9341 Metabolic encephalopathy: Secondary | ICD-10-CM

## 2015-09-08 DIAGNOSIS — J208 Acute bronchitis due to other specified organisms: Secondary | ICD-10-CM

## 2015-09-08 DIAGNOSIS — R0902 Hypoxemia: Secondary | ICD-10-CM

## 2015-09-08 DIAGNOSIS — N179 Acute kidney failure, unspecified: Secondary | ICD-10-CM | POA: Diagnosis present

## 2015-09-08 DIAGNOSIS — I071 Rheumatic tricuspid insufficiency: Secondary | ICD-10-CM | POA: Diagnosis present

## 2015-09-08 HISTORY — DX: Acute kidney failure, unspecified: N17.9

## 2015-09-08 LAB — LEGIONELLA ANTIGEN, URINE

## 2015-09-08 LAB — COMPREHENSIVE METABOLIC PANEL
ALK PHOS: 53 U/L (ref 38–126)
ALT: 23 U/L (ref 17–63)
ANION GAP: 7 (ref 5–15)
AST: 20 U/L (ref 15–41)
Albumin: 3.3 g/dL — ABNORMAL LOW (ref 3.5–5.0)
BILIRUBIN TOTAL: 0.8 mg/dL (ref 0.3–1.2)
BUN: 37 mg/dL — AB (ref 6–20)
CALCIUM: 8 mg/dL — AB (ref 8.9–10.3)
CO2: 48 mmol/L — ABNORMAL HIGH (ref 22–32)
Chloride: 88 mmol/L — ABNORMAL LOW (ref 101–111)
Creatinine, Ser: 1.02 mg/dL (ref 0.61–1.24)
GFR calc Af Amer: >60 mL/min (ref >60)
GLUCOSE: 200 mg/dL — AB (ref 65–99)
POTASSIUM: 3.7 mmol/L (ref 3.5–5.1)
Sodium: 143 mmol/L (ref 135–145)
TOTAL PROTEIN: 6.2 g/dL — AB (ref 6.5–8.1)

## 2015-09-08 LAB — CBC WITH DIFFERENTIAL/PLATELET
BASOS ABS: 0 10*3/uL (ref 0.0–0.1)
Basophils Relative: 0 %
Eosinophils Absolute: 0 10*3/uL (ref 0.0–0.7)
Eosinophils Relative: 0 %
HEMATOCRIT: 45.3 % (ref 39.0–52.0)
HEMOGLOBIN: 14.3 g/dL (ref 13.0–17.0)
LYMPHS PCT: 5 %
Lymphs Abs: 0.6 10*3/uL — ABNORMAL LOW (ref 0.7–4.0)
MCH: 32 pg (ref 26.0–34.0)
MCHC: 31.6 g/dL (ref 30.0–36.0)
MCV: 101.3 fL — AB (ref 78.0–100.0)
MONO ABS: 0.4 10*3/uL (ref 0.1–1.0)
Monocytes Relative: 4 %
NEUTROS ABS: 9.7 10*3/uL — AB (ref 1.7–7.7)
NEUTROS PCT: 91 %
Platelets: 133 10*3/uL — ABNORMAL LOW (ref 150–400)
RBC: 4.47 MIL/uL (ref 4.22–5.81)
RDW: 12.6 % (ref 11.5–15.5)
WBC: 10.7 10*3/uL — ABNORMAL HIGH (ref 4.0–10.5)

## 2015-09-08 LAB — GLUCOSE, CAPILLARY
GLUCOSE-CAPILLARY: 181 mg/dL — AB (ref 65–99)
GLUCOSE-CAPILLARY: 160 mg/dL — AB (ref 65–99)
GLUCOSE-CAPILLARY: 128 mg/dL — AB (ref 65–99)
GLUCOSE-CAPILLARY: 101 mg/dL — AB (ref 65–99)
Glucose-Capillary: 137 mg/dL — ABNORMAL HIGH (ref 65–99)

## 2015-09-08 MED ORDER — INSULIN ASPART 100 UNIT/ML ~~LOC~~ SOLN
0.0000 [IU] | Freq: Three times a day (TID) | SUBCUTANEOUS | Status: DC
  Administered 2015-09-08 (×2): 2 [IU] via SUBCUTANEOUS
  Administered 2015-09-09: 3 [IU] via SUBCUTANEOUS
  Administered 2015-09-10: 1 [IU] via SUBCUTANEOUS
  Administered 2015-09-11: 2 [IU] via SUBCUTANEOUS

## 2015-09-08 MED ORDER — POTASSIUM CHLORIDE IN NACL 20-0.9 MEQ/L-% IV SOLN
INTRAVENOUS | Status: AC
  Administered 2015-09-08: 09:00:00 via INTRAVENOUS
  Filled 2015-09-08: qty 1000

## 2015-09-08 MED ORDER — GUAIFENESIN ER 600 MG PO TB12
1200.0000 mg | ORAL_TABLET | Freq: Two times a day (BID) | ORAL | Status: DC
  Administered 2015-09-08 – 2015-09-12 (×9): 1200 mg via ORAL
  Filled 2015-09-08 (×11): qty 2

## 2015-09-08 MED ORDER — KCL IN DEXTROSE-NACL 20-5-0.9 MEQ/L-%-% IV SOLN: INTRAVENOUS | Status: DC

## 2015-09-08 MED ORDER — LIVING WELL WITH DIABETES BOOK
Freq: Once | Status: AC
  Administered 2015-09-08: 13:00:00
  Filled 2015-09-08: qty 1

## 2015-09-08 NOTE — Progress Notes (Signed)
RT placed patient on BIPAP. Patient is tolerating BIPAP well at this time. RT will continue to monitor.

## 2015-09-08 NOTE — Progress Notes (Signed)
Name: Timothy Vaughn MRN: [id number] DOB: September 25, 1928    ADMISSION DATE:  09/06/2015 CONSULTATION DATE:  09/06/15  REFERRING MD :  Tamala Julian  CHIEF COMPLAINT:  SOB  BRIEF PATIENT DESCRIPTION: 82 y.o. male with restrictive lung disease due to severe kyphoscoliosis, brought to [facility] ED 01/03 due to persistent URI symptoms x 1 week.  Saw PCP and was treated with abx, BD's, cough suppressants.  Admitted by [facility] for hypoxic and hypercarbic resp failure and after admission, remained hypersomnolent; therefore transferred to ICU and  PCCM called for further recs.  SIGNIFICANT EVENTS  09/01/15 > initially saw PCP, given BD's, z-pack, cough suppressant. 01/04 > back to PCP for persistent symptoms > sent to ED. 01/014 > admitted with hypoxic and hypercarbic respiratory failure > transferred to ICU for BiPAP and possible intubation. 1/4 2D echo>>> EF 55-60%, mod dilated RA, normal systolic and diastolic function, PA peak pressure 61mmHg  STUDIES:  CXR 01/03 > no acute process. Severe dextroscoliosis.   SUBJECTIVE: Feeling much better this am.  Only wore bipap 3 hrs overnight.  Says "its terrible".  Awake and alert, ordering lunch.     VITAL SIGNS: Temp:  [97.9 F (36.6 C)-98.3 F (36.8 C)] 98.2 F (36.8 C) (01/05 0400) Pulse Rate:  [46-109] 53 (01/05 0700) Resp:  [16-26] 22 (01/05 0700) BP: (91-141)/(41-93) 129/42 mmHg (01/05 0600) SpO2:  [89 %-100 %] 95 % (01/05 0700) FiO2 (%):  [40 %] 40 % (01/05 0129) Weight:  [136 lb 14.5 oz (62.1 kg)] 136 lb 14.5 oz (62.1 kg) (01/05 0400)  PHYSICAL EXAMINATION: Gen: pleasant elderly male, NAD on [place] HENT:  MMM, OP clear PULM: resps even non labored on [place], few scattered rhonchi bilaterally, normal effort CV: Difficult exam due to ronchi from lungs, JVD mild, no clear mgr GI: BS+, soft, nontender MSK: normal bulk and tone Derm: no rash or skin breakdown Neuro: A&Ox4, maew    Recent Labs Lab 09/06/15 1710 09/07/15 0105 09/08/15 0335  NA 142 145 143   K 3.7 4.0 3.7  CL 92* 91* 88*  CO2 44* 42* 48*  BUN 34* 29* 37*  CREATININE 1.02 1.00 1.02  GLUCOSE 146* 248* 200*    Recent Labs Lab 09/06/15 1710 09/07/15 0105 09/08/15 0335  HGB 15.4 16.0 14.3  HCT 48.7 51.7 45.3  WBC 7.6 5.7 10.7*  PLT 155 149* 133*   Dg Chest 2 View  09/06/2015  CLINICAL DATA:  Short of breath, weakness, cough for 2 day EXAM: CHEST  2 VIEW COMPARISON:  05/20/2013 FINDINGS: There is severe deformity of the thorax and severe dextroscoliosis. Heart is enlarged. Chronic opacities in both mid and lower lung zones are likely related to volume loss. IMPRESSION: Chronic changes.  No interval change since the prior study. Electronically Signed   By: Marybelle Killings M.D.   On: 09/06/2015 15:56   Dg Chest Port 1 View  09/07/2015  CLINICAL DATA:  Acute onset of congested cough. Altered mental status. Initial encounter. EXAM: PORTABLE CHEST 1 VIEW COMPARISON:  Chest radiograph performed 09/06/2015 FINDINGS: Evaluation is significantly suboptimal due to patient rotation and the degree of left convex thoracic scoliosis. Patchy bilateral airspace opacities could reflect pneumonia or mild pulmonary edema. No pleural effusion or pneumothorax is seen. The cardiomediastinal silhouette is normal in size. No acute osseous abnormalities are identified. There is chronic bilateral rib deformity, reflecting underlying severe thoracic scoliosis. IMPRESSION: Patchy bilateral airspace opacities could reflect pneumonia or mild pulmonary edema Electronically Signed   By: Garald Balding  M.D.   On: 09/07/2015 01:22    ASSESSMENT / PLAN:  Acute on chronic hypercarbic respiratory failure - multifactorial in setting severe kyphosis likely causing chronic hypercapnia now c/b pulmonary edema +/- acute bronchitis and potentially further c/b pulmonary HTN (PA pressure 18mmHg on echo).  Doubt PNA given no fever, wbc, pct.   Much improved after gentle diuresis and bipap.   Plan: Pt does not want to wear bipap  further -- check ABG in am after no bipap overnight Continue antibiotics for now Echo today Will likely need BIPAP titration study as outpatient - not clear whether he will be compliant long term  Supplemental O2 when off BiPAP to maintain SpO2 > 92% Albuterol PRN Pulmonary hygiene KVO IVF  Acute hypercarbic encephalopathy -related to hypercarbia > Resolved.  Plan: Treat hypercarbia No sedating meds  HTN, HLD Plan: Continue outpatient lasix, pravastatin, ASA  check troponin 1  Hypocalcemia Hypokalemia  Plan: Correct electrolytes as indicated    Nickolas Madrid, NP 09/08/2015  9:24 AM Pager: [phone] or [phone]

## 2015-09-08 NOTE — Progress Notes (Signed)
RT took patient off BIPAP per patient request and placed on 4L [place]. RN is aware.

## 2015-09-08 NOTE — Progress Notes (Addendum)
Physical Therapy Treatment Patient Details Name: Timothy Vaughn MRN: [id number] DOB: August 12, 1929 Today's Date: Sep 20, 2015    History of Present Illness 82 y.o. male with restrictive lung disease due to severe kyphoscoliosis, brought to [facility] ED 01/03 due to persistent URI symptoms x 1 week. Admitted for hypoxic and hypercarbic resp failure to SDU, required BiPAP.    PT Comments    Pt ambulated in hallway on 3L O2 and tolerated well.  Follow Up Recommendations  Supervision/Assistance - 24 hour;SNF;CIR     Equipment Recommendations  None recommended by PT    Recommendations for Other Services       Precautions / Restrictions Precautions Precautions: Fall Precaution Comments: monitor sats and HR, on Oxygen    Mobility  Bed Mobility               General bed mobility comments: pt up in recliner on arrival  Transfers Overall transfer level: Needs assistance Equipment used: 1 person hand held assist Transfers: Sit to/from Stand Sit to Stand: Min assist         General transfer comment: verbal cues for hand placement, assist to rise and steady  Ambulation/Gait Ambulation/Gait assistance: Min guard Ambulation Distance (Feet): 160 Feet Assistive device: None Gait Pattern/deviations: Step-through pattern;Decreased stride length     General Gait Details: pt pushed IV pole, slow but steady gait, SpO2 97% on 3L O2 and HR in 90s during gait, pt tolerated well, reports 1/4 dyspnea   Stairs            Wheelchair Mobility    Modified Rankin (Stroke Patients Only)       Balance                                    Cognition Arousal/Alertness: Awake/alert Behavior During Therapy: WFL for tasks assessed/performed Overall Cognitive Status: Within Functional Limits for tasks assessed                      Exercises      General Comments        Pertinent Vitals/Pain Pain Assessment: No/denies pain    Home Living                       Prior Function            PT Goals (current goals can now be found in the care plan section) Progress towards PT goals: Progressing toward goals    Frequency  Min 3X/week    PT Plan Current plan remains appropriate    Co-evaluation             End of Session Equipment Utilized During Treatment: Oxygen Activity Tolerance: Patient tolerated treatment well Patient left: with call bell/phone within reach;with chair alarm set;with family/visitor present (left in w/c with RN- to go to radiology)     Time: [id number] PT Time Calculation (min) (ACUTE ONLY): 10 min  Charges:  $Gait Training: 8-22 mins                    G Codes:      Shavy Beachem,KATHrine E 2015/09/20, 1:29 PM Carmelia Bake, PT, DPT 09/20/2015 Pager: [phone]

## 2015-09-08 NOTE — Progress Notes (Signed)
[facility] PROGRESS NOTE  Timothy Vaughn [id number] DOB: 21-Apr-1929 DOA: 09/06/2015 PCP: Alesia Richards, MD  Summary 09/07/15: I have seen and examined Timothy Vaughn at bedside and reviewed his chart. Appreciate pulmonary. Timothy Vaughn is a pleasant 82 year old male with restrictive lung disease secondary to severe kyphoscoliosis; who presented from his primary care office with shortness of breath and confusion he was found to have acute hypoxic/hypercapnic respiratory failure possibly related to community-acquired pneumonia, resulting in acute metabolic encephalopathy. There is question whether his respiratory failure is related to worsening kyphoscoliosis. He is currently on broad-spectrum antibiotics/systemic steroids/BiPAP/bronchodilators. He will get 2-D echocardiogram today. He seems more lucid and is not sure how he ended up in the hospital. He remains nothing by mouth on BiPAP. Will continue current management with pulmonary help. 09/08/15: Appreciate pulmonary. 2-D echo showed normal ejection fraction/normal diastolic function and no wall motion abnormalities, moderate tricuspid regurgitation-therefore patient did not have CHF decompensation. His renal function is worsening with BUN/creatinine 37/1.02, likely from diuresis. Blood cultures remain negative. Patient has hyperglycemia in setting of history of "prediabetes"-mild DM2. Will change diet to cope consistent, obtain hemoglobin A1c and start sensitive sliding scale insulin. Will also gently rehydrate patient, mobilize per physical therapy, and transfer to MedSurg when bed becomes available. Patient is very much with it today and he is surprised at how weak he feels. He still has cough.  Plan Acute respiratory failure with hypoxia and hypercapnia (HCC)/Kyphoscoliosis/Restrictive lung disease due to kyphoscoliosis/Hypoxia/CAP (community acquired pneumonia)/Encephalopathy, metabolic/Elevated brain natriuretic peptide (BNP)  level/acute kidney injury  NS with kcl at 50cc/hr  Day 3 Ceftriaxone/Zithromax  Increase Mucinex to 1200 mg twice a day  Follow pulmonary recommendations Essential hypertension/Hyperlipidemia/moderate tricuspid regurgitation/diabetes mellitus type 2  Check hemoglobin A1c  SSI  Change diet to cover consistent Pressure ulcer  Wound care as needed Code Status: Full Code Family Communication: No family members at bedside. Had a good conversation with patient. Disposition Plan: Transfer to MedSurg unit when bed becomes available. Home eventually.   Consultants:  Pulmonary  Procedures:  BiPAP  Antibiotics:  Ceftriaxone 09/06/2015>  Azithromycin 09/06/2015>  HPI/Subjective: Feels weak but better. Complains of cough.  Objective: Filed Vitals:   09/08/15 0600 09/08/15 0700  BP: 129/42   Pulse: 68 53  Temp:    Resp: 24 22    Intake/Output Summary (Last 24 hours) at 09/08/15 0746 Last data filed at 09/08/15 0450  Gross per 24 hour  Intake    400 ml  Output   2075 ml  Net  -1675 ml   Filed Weights   09/06/15 1642 09/06/15 2146 09/08/15 0400  Weight: 63.504 kg (140 lb) 63.05 kg (139 lb) 62.1 kg (136 lb 14.5 oz)    Exam:   General:  Comfortable at rest.  Cardiovascular: S1-S2 normal. No murmurs. Pulse regular.  Respiratory: Good air entry bilaterally. No rhonchi or rales. Coughing.  Abdomen: Soft and nontender. Normal bowel sounds. No organomegaly.  Musculoskeletal: No pedal edema   Neurological: Intact  Data Reviewed: Basic Metabolic Panel:  Recent Labs Lab 09/06/15 1710 09/07/15 0105 09/08/15 0335  NA 142 145 143  K 3.7 4.0 3.7  CL 92* 91* 88*  CO2 44* 42* 48*  GLUCOSE 146* 248* 200*  BUN 34* 29* 37*  CREATININE 1.02 1.00 1.02  CALCIUM 8.5* 8.6* 8.0*   Liver Function Tests:  Recent Labs Lab 09/06/15 1710 09/08/15 0335  AST 38 20  ALT 30 23  ALKPHOS 69 53  BILITOT 1.2 0.8  PROT 7.0  6.2*  ALBUMIN 3.9 3.3*   No results for  input(s): LIPASE, AMYLASE in the last 168 hours. No results for input(s): AMMONIA in the last 168 hours. CBC:  Recent Labs Lab 09/06/15 1710 09/07/15 0105 09/08/15 0335  WBC 7.6 5.7 10.7*  NEUTROABS 5.3  --  9.7*  HGB 15.4 16.0 14.3  HCT 48.7 51.7 45.3  MCV 102.5* 104.4* 101.3*  PLT 155 149* 133*   Cardiac Enzymes:  Recent Labs Lab 09/07/15 0310  TROPONINI <0.03   BNP (last 3 results)  Recent Labs  09/06/15 1558  BNP 145.9*    ProBNP (last 3 results) No results for input(s): PROBNP in the last 8760 hours.  CBG: No results for input(s): GLUCAP in the last 168 hours.  Recent Results (from the past 240 hour(s))  MRSA PCR Screening     Status: None   Collection Time: 09/07/15  9:09 AM  Result Value Ref Range Status   MRSA by PCR NEGATIVE NEGATIVE Final    Comment:        The GeneXpert MRSA Assay (FDA approved for NASAL specimens only), is one component of a comprehensive MRSA colonization surveillance program. It is not intended to diagnose MRSA infection nor to guide or monitor treatment for MRSA infections.      Studies: Dg Chest 2 View  09/06/2015  CLINICAL DATA:  Short of breath, weakness, cough for 2 day EXAM: CHEST  2 VIEW COMPARISON:  05/20/2013 FINDINGS: There is severe deformity of the thorax and severe dextroscoliosis. Heart is enlarged. Chronic opacities in both mid and lower lung zones are likely related to volume loss. IMPRESSION: Chronic changes.  No interval change since the prior study. Electronically Signed   By: Marybelle Killings M.D.   On: 09/06/2015 15:56   Dg Chest Port 1 View  09/07/2015  CLINICAL DATA:  Acute onset of congested cough. Altered mental status. Initial encounter. EXAM: PORTABLE CHEST 1 VIEW COMPARISON:  Chest radiograph performed 09/06/2015 FINDINGS: Evaluation is significantly suboptimal due to patient rotation and the degree of left convex thoracic scoliosis. Patchy bilateral airspace opacities could reflect pneumonia or mild  pulmonary edema. No pleural effusion or pneumothorax is seen. The cardiomediastinal silhouette is normal in size. No acute osseous abnormalities are identified. There is chronic bilateral rib deformity, reflecting underlying severe thoracic scoliosis. IMPRESSION: Patchy bilateral airspace opacities could reflect pneumonia or mild pulmonary edema Electronically Signed   By: Garald Balding M.D.   On: 09/07/2015 01:22    Scheduled Meds: . aspirin  81 mg Oral Daily  . azithromycin  500 mg Intravenous Q24H  . cefTRIAXone (ROCEPHIN)  IV  1 g Intravenous Q24H  . cholecalciferol  1,000 Units Oral Daily  . enoxaparin (LOVENOX) injection  40 mg Subcutaneous Q24H  . famotidine (PEPCID) IV  20 mg Intravenous Q12H  . guaiFENesin  1,200 mg Oral BID  . ipratropium-albuterol  3 mL Nebulization Q6H  . multivitamin with minerals  1 tablet Oral Daily  . omega-3 acid ethyl esters  1 g Oral Daily  . potassium chloride SA  40 mEq Oral Daily  . pravastatin  40 mg Oral q1800  . vitamin C  125 mg Oral Daily   Continuous Infusions: . dextrose 5 % and 0.9 % NaCl with KCl 20 mEq/L       Time spent: 25 minutes    Weltha Cathy  [facility] Pager [phone]. If 7PM-7AM, please contact night-coverage at [email or web address], password [facility] 09/08/2015, 7:46 AM  LOS: 2 days

## 2015-09-08 NOTE — Progress Notes (Signed)
Inpatient Diabetes Program Recommendations  AACE/ADA: New Consensus Statement on Inpatient Glycemic Control (2015)  Target Ranges:  Prepandial:   less than 140 mg/dL      Peak postprandial:   less than 180 mg/dL (1-2 hours)      Critically ill patients:  140 - 180 mg/dL   Review of Glycemic Control  Diabetes history: Pre-diabetes Outpatient Diabetes medications: None - On Prednisone 20 mg QD Current orders for Inpatient glycemic control: Novolog sensitive tidwc  Results for BENSON, RILE (MRN [id number]) as of 09/08/2015 12:35  Ref. Range 09/08/2015 08:24 09/08/2015 11:43  Glucose-Capillary Latest Ref Range: 65-99 mg/dL 181 (H) 160 (H)  Results for SOU, AHMAD (MRN [id number]) as of 09/08/2015 12:35  Ref. Range 09/06/2015 17:10 09/07/2015 01:05 09/08/2015 03:35  Glucose Latest Ref Range: 65-99 mg/dL 146 (H) 248 (H) 200 (H)  Results for KEVORK, BOWNDS (MRN [id number]) as of 09/08/2015 12:35  Ref. Range 12/07/2014 10:20 04/12/2015 12:38  Hemoglobin A1C Latest Ref Range: <5.7 % 6.0 (H) 6.0 (H)   Steroid-induced hyperglycemia.  Spoke with pt at length about steroid-induced hyperglycemia. Pt states he eats a healthy diet, uses sugar-substitute in beverages, and gets exercise. PCP diagnosed pre-diabetes with HgbA1C of 6.0%. Steroids now d/ced. Blood sugars expected to improve. Discussed blood glucose monitoring with pt, instructing to check at various times. Take logbook to MD visit for assessment and recommendations. Will need prescription for meter, strips and lancets at discharge.  Inpatient Diabetes Program Recommendations:     Continue with Novolog sensitive tidwc while inpatient. Awaiting HgbA1C results. Prescription for meter, strips and lancets at discharge. F/U with PCP for glycemic control management.  Will follow. Thank you. Lorenda Peck, RD, LDN, CDE Inpatient Diabetes Coordinator [phone]

## 2015-09-08 NOTE — Consult Note (Signed)
Physical Medicine and Rehabilitation Consult Reason for Consult: Hypoxic and hypercarbic respiratory failure Referring Physician: [facility]   HPI: Timothy Vaughn is a 82 y.o. right handed male with history of hypertension, restrictive lung disease secondary to severe kyphoscoliosis. Patient lives with spouse independent prior to admission.  He cares for his wife at present, but both are able to have 24/7 support on discharge. Wife has had a stroke in the past and has poor balance. One level home 6 steps to entry. Presented 09/06/2015 with increasing shortness of breath 1 week as well as complaints of cough and wheezing. Oxygen saturations 89% on room air and dropped to 64%. He had received a prednisone Z-Pak by his primary M.D. days prior. Maintain on 4 L nasal cannula. Stat ABG showed pH of 7.23, PO2 of 64, bicarbonate 44 and PCO2 of 101. Chest x-ray with chronic changes. Placed on BiPAP per pulmonary medicine. Echocardiogram with ejection fraction of 60% no wall motion abnormalities. Systolic function was normal. He did not require intubation. Slowly weaned from oxygen. Maintained on broad-spectrum antibiotics. Subcutaneous Lovenox for DVT prophylaxis. Continues on a regular consistency diet. Physical therapy evaluated 09/07/2015 with recommendations of physical medicine rehabilitation consult.  Review of Systems  Constitutional: Negative for fever.  HENT: Negative for hearing loss.   Eyes: Negative for blurred vision and double vision.  Respiratory: Positive for cough, shortness of breath and wheezing.   Cardiovascular: Negative for chest pain and palpitations.  Gastrointestinal: Negative for nausea and vomiting.  Genitourinary: Negative for dysuria and hematuria.  Musculoskeletal: Positive for joint pain.  Skin: Negative for rash.  Neurological: Positive for weakness. Negative for seizures, loss of consciousness and headaches.  All other systems reviewed and are negative.  Past  Medical History  Diagnosis Date  . Hypertension   . Hyperlipidemia   . Pre-diabetes   . Vitamin D deficiency   . Cancer [facility])     prostate   Past Surgical History  Procedure Laterality Date  . Prostatectomy  2001   Family History  Problem Relation Age of Onset  . Heart disease Mother   . COPD Mother   . Cancer Father     colon  . Alzheimer's disease Father   . Heart disease Father   . Cancer Brother     stomach  . Stroke Maternal Grandfather   . Diabetes Maternal Grandfather    Social History:  reports that he has never smoked. He does not have any smokeless tobacco history on file. He reports that he does not drink alcohol or use illicit drugs. Allergies:  Allergies  Allergen Reactions  . Lipitor [Atorvastatin] Other (See Comments)    Severe NIV  . Niaspan [Niacin Er] Itching   Medications Prior to Admission  Medication Sig Dispense Refill  . Albuterol Sulfate (PROAIR RESPICLICK) 123XX123 (90 BASE) MCG/ACT AEPB Inhale 2 Act into the lungs every 6 (six) hours as needed (for shortness of breath). 1 each 0  . Ascorbic Acid (VITAMIN C) 100 MG tablet Take 100 mg by mouth daily.    Marland Kitchen aspirin 81 MG tablet Take 81 mg by mouth daily.    Marland Kitchen azithromycin (ZITHROMAX) 250 MG tablet Take 250 mg by mouth daily. ABT Start Date 09/01/15 & End Date 09/06/15.    Marland Kitchen Cholecalciferol (VITAMIN D PO) Take 8,000 Int'l Units by mouth daily.     . Multiple Vitamin (MULTI VITAMIN DAILY PO) Take 1 tablet by mouth daily.     . Omega-3 Fatty  Acids (FISH OIL PO) Take 1 capsule by mouth daily.     . potassium chloride SA (K-DUR,KLOR-CON) 20 MEQ tablet Take 2 tablets (40 mEq total) by mouth daily. 60 tablet 0  . pravastatin (PRAVACHOL) 40 MG tablet TAKE 1 TABLET AT BEDTIME FOR CHOLESTEROL 90 tablet 99  . predniSONE (DELTASONE) 20 MG tablet Take 20 mg by mouth daily.   0  . furosemide (LASIX) 40 MG tablet Take 40 mg by mouth daily as needed for fluid or edema.     . promethazine-dextromethorphan  (PROMETHAZINE-DM) 6.25-15 MG/5ML syrup Take 5 mL as needed q8hrs  for severe coughing 360 mL 1    Home: Home Living Family/patient expects to be discharged to:: Private residence Living Arrangements: Spouse/significant other Available Help at Discharge: Family, Available PRN/intermittently Type of Home: House Home Access: Stairs to enter Technical brewer of Steps: 6-garage Entrance Stairs-Rails: Right Home Layout: One level Home Equipment: None Additional Comments: patient is primary caregiver of wife who had a stroke and has poor balance,   Functional History: Prior Function Level of Independence: Independent Functional Status:  Mobility: Bed Mobility Overal bed mobility: Needs Assistance Bed Mobility: Supine to Sit Supine to sit: Min assist, HOB elevated General bed mobility comments: assist for trunk to upright, able to scoot to the edge. Transfers Overall transfer level: Needs assistance Equipment used: 1 person hand held assist Transfers: Sit to/from Stand, Stand Pivot Transfers Sit to Stand: Mod assist Stand pivot transfers: Mod assist General transfer comment: assist for balance, multimodal cues, small steps to recliner.       ADL:    Cognition: Cognition Overall Cognitive Status: Within Functional Limits for tasks assessed Orientation Level: Oriented X4 Cognition Arousal/Alertness: Awake/alert Behavior During Therapy: WFL for tasks assessed/performed Overall Cognitive Status: Within Functional Limits for tasks assessed  Blood pressure 129/42, pulse 53, temperature 98.1 F (36.7 C), temperature source Oral, resp. rate 22, height 5\' 5"  (1.651 m), weight 62.1 kg (136 lb 14.5 oz), SpO2 97 %. Physical Exam  Vitals reviewed. Constitutional: He is oriented to person, place, and time. No distress.  82 year old right-handed male in no acute distress.  HENT:  Head: Normocephalic and atraumatic.  Eyes: Conjunctivae and EOM are normal.  Neck: Normal range of  motion. Neck supple.  Cardiovascular: Normal rate and regular rhythm.   Respiratory: Effort normal. No respiratory distress.  Decreased breath sounds at the bases Ronchi +Claiborne  GI: Soft. Bowel sounds are normal. He exhibits no distension.  Musculoskeletal: He exhibits no edema or tenderness.  Kyphotic posture  Neurological: He is alert and oriented to person, place, and time.  Oriented to basic questions and follows commands. Sensation intact to light touch Motor: 4+/5 throughout  Skin: He is not diaphoretic.  Warm and dry on visible skin  Psychiatric: He has a normal mood and affect. His behavior is normal.    Results for orders placed or performed during the hospital encounter of 09/06/15 (from the past 24 hour(s))  CBC with Differential/Platelet     Status: Abnormal   Collection Time: 09/08/15  3:35 AM  Result Value Ref Range   WBC 10.7 (H) 4.0 - 10.5 K/uL   RBC 4.47 4.22 - 5.81 MIL/uL   Hemoglobin 14.3 13.0 - 17.0 g/dL   HCT 45.3 39.0 - 52.0 %   MCV 101.3 (H) 78.0 - 100.0 fL   MCH 32.0 26.0 - 34.0 pg   MCHC 31.6 30.0 - 36.0 g/dL   RDW 12.6 11.5 - 15.5 %   Platelets  133 (L) 150 - 400 K/uL   Neutrophils Relative % 91 %   Neutro Abs 9.7 (H) 1.7 - 7.7 K/uL   Lymphocytes Relative 5 %   Lymphs Abs 0.6 (L) 0.7 - 4.0 K/uL   Monocytes Relative 4 %   Monocytes Absolute 0.4 0.1 - 1.0 K/uL   Eosinophils Relative 0 %   Eosinophils Absolute 0.0 0.0 - 0.7 K/uL   Basophils Relative 0 %   Basophils Absolute 0.0 0.0 - 0.1 K/uL  Comprehensive metabolic panel     Status: Abnormal   Collection Time: 09/08/15  3:35 AM  Result Value Ref Range   Sodium 143 135 - 145 mmol/L   Potassium 3.7 3.5 - 5.1 mmol/L   Chloride 88 (L) 101 - 111 mmol/L   CO2 48 (H) 22 - 32 mmol/L   Glucose, Bld 200 (H) 65 - 99 mg/dL   BUN 37 (H) 6 - 20 mg/dL   Creatinine, Ser 1.02 0.61 - 1.24 mg/dL   Calcium 8.0 (L) 8.9 - 10.3 mg/dL   Total Protein 6.2 (L) 6.5 - 8.1 g/dL   Albumin 3.3 (L) 3.5 - 5.0 g/dL   AST 20  15 - 41 U/L   ALT 23 17 - 63 U/L   Alkaline Phosphatase 53 38 - 126 U/L   Total Bilirubin 0.8 0.3 - 1.2 mg/dL   GFR calc non Af Amer >60 >60 mL/min   GFR calc Af Amer >60 >60 mL/min   Anion gap 7 5 - 15  Glucose, capillary     Status: Abnormal   Collection Time: 09/08/15  8:24 AM  Result Value Ref Range   Glucose-Capillary 181 (H) 65 - 99 mg/dL   Dg Chest 2 View  09/08/2015  CLINICAL DATA:  Acute respiratory failure, hypoxemia EXAM: CHEST  2 VIEW COMPARISON:  09/07/2015 FINDINGS: Study is limited by patient rotation and significant levoscoliosis of lower cervical and thoracic spine. There is dextroscoliosis of the lumbar spine. No convincing pulmonary edema. Slight improvement in aeration. Streaky residual right basilar atelectasis or infiltrate. IMPRESSION: Limited study due to significant scoliosis and patient rotation. No convincing pulmonary edema. Slight improvement in aeration. Streaky residual right basilar atelectasis or infiltrate. Electronically Signed   By: Lahoma Crocker M.D.   On: 09/08/2015 11:44   Dg Chest 2 View  09/06/2015  CLINICAL DATA:  Short of breath, weakness, cough for 2 day EXAM: CHEST  2 VIEW COMPARISON:  05/20/2013 FINDINGS: There is severe deformity of the thorax and severe dextroscoliosis. Heart is enlarged. Chronic opacities in both mid and lower lung zones are likely related to volume loss. IMPRESSION: Chronic changes.  No interval change since the prior study. Electronically Signed   By: Marybelle Killings M.D.   On: 09/06/2015 15:56   Dg Chest Port 1 View  09/07/2015  CLINICAL DATA:  Acute onset of congested cough. Altered mental status. Initial encounter. EXAM: PORTABLE CHEST 1 VIEW COMPARISON:  Chest radiograph performed 09/06/2015 FINDINGS: Evaluation is significantly suboptimal due to patient rotation and the degree of left convex thoracic scoliosis. Patchy bilateral airspace opacities could reflect pneumonia or mild pulmonary edema. No pleural effusion or pneumothorax is  seen. The cardiomediastinal silhouette is normal in size. No acute osseous abnormalities are identified. There is chronic bilateral rib deformity, reflecting underlying severe thoracic scoliosis. IMPRESSION: Patchy bilateral airspace opacities could reflect pneumonia or mild pulmonary edema Electronically Signed   By: Garald Balding M.D.   On: 09/07/2015 01:22    Assessment/Plan: Diagnosis: Debility/Hypoxic and  hypercarbic respiratory failure Labs and images independently reviewed.  Records reviewed and summated above.  1. Does the need for close, 24 hr/day medical supervision in concert with the patient's rehab needs make it unreasonable for this patient to be served in a less intensive setting? No  2. Co-Morbidities requiring supervision/potential complications: HTN (monitor and provide prns in accordance with increased physical exertion and pain), restrictive lung disease secondary to severe kyphoscoliosis (cont to monitor O2 sats with increased physical exertion), tachypnea (cont to monitor RR with increased activity) 3. Due to skin/wound care, disease management and patient education, does the patient require 24 hr/day rehab nursing? Potentially 4. Does the patient require coordinated care of a physician, rehab nurse, PT (1-2 hrs/day, 5 days/week) and OT (1-2 hrs/day, 5 days/week) to address physical and functional deficits in the context of the above medical diagnosis(es)? No Addressing deficits in the following areas: endurance, locomotion, strength, transferring, toileting and psychosocial support 5. Can the patient actively participate in an intensive therapy program of at least 3 hrs of therapy per day at least 5 days per week? Yes 6. The potential for patient to make measurable gains while on inpatient rehab is good and fair 7. Anticipated functional outcomes upon discharge from inpatient rehab are independent and modified independent  with PT, independent and modified independent with OT,  n/a with SLP. 8. Estimated rehab length of stay to reach the above functional goals is: N/A 9. Does the patient have adequate social supports and living environment to accommodate these discharge functional goals? Yes 10. Anticipated D/C setting: Home 11. Anticipated post D/C treatments: HH therapy and Home excercise program 12. Overall Rehab/Functional Prognosis: excellent  RECOMMENDATIONS: This patient's condition is appropriate for continued rehabilitative care in the following setting: Pt ambulating well with PT and will likely continue to make gains quickly now that his respiratory status is improving.  Pt's family states they are able to provide support for pt and wife and therefore would recommend home with [facility]   Patient has agreed to participate in recommended program. Yes Note that insurance prior authorization may be required for reimbursement for recommended care.  Comment: Rehab Admissions Coordinator to follow up.  Delice Lesch, MD 09/08/2015

## 2015-09-09 DIAGNOSIS — R0682 Tachypnea, not elsewhere classified: Secondary | ICD-10-CM

## 2015-09-09 DIAGNOSIS — J9601 Acute respiratory failure with hypoxia: Secondary | ICD-10-CM

## 2015-09-09 LAB — BLOOD GAS, ARTERIAL
ACID-BASE EXCESS: 16.3 mmol/L — AB (ref 0.0–2.0)
BICARBONATE: 45 meq/L — AB (ref 20.0–24.0)
DRAWN BY: 11249
O2 CONTENT: 4 L/min
O2 Saturation: 98.6 %
PATIENT TEMPERATURE: 98.3
PCO2 ART: 74 mmHg — AB (ref 35.0–45.0)
TCO2: 39.8 mmol/L (ref 0–100)
pH, Arterial: 7.4 (ref 7.350–7.450)
pO2, Arterial: 128 mmHg — ABNORMAL HIGH (ref 80.0–100.0)

## 2015-09-09 LAB — BASIC METABOLIC PANEL
ANION GAP: 7 (ref 5–15)
BUN: 29 mg/dL — ABNORMAL HIGH (ref 6–20)
CHLORIDE: 90 mmol/L — AB (ref 101–111)
CO2: 45 mmol/L — AB (ref 22–32)
Calcium: 8.5 mg/dL — ABNORMAL LOW (ref 8.9–10.3)
Creatinine, Ser: 0.77 mg/dL (ref 0.61–1.24)
GFR calc non Af Amer: >60 mL/min (ref >60)
GLUCOSE: 106 mg/dL — AB (ref 65–99)
POTASSIUM: 4.5 mmol/L (ref 3.5–5.1)
Sodium: 142 mmol/L (ref 135–145)

## 2015-09-09 LAB — CBC
HEMATOCRIT: 46 % (ref 39.0–52.0)
HEMOGLOBIN: 13.8 g/dL (ref 13.0–17.0)
MCH: 31.2 pg (ref 26.0–34.0)
MCHC: 30 g/dL (ref 30.0–36.0)
MCV: 104.1 fL — AB (ref 78.0–100.0)
Platelets: 133 10*3/uL — ABNORMAL LOW (ref 150–400)
RBC: 4.42 MIL/uL (ref 4.22–5.81)
RDW: 13 % (ref 11.5–15.5)
WBC: 7.8 10*3/uL (ref 4.0–10.5)

## 2015-09-09 LAB — GLUCOSE, CAPILLARY
GLUCOSE-CAPILLARY: 107 mg/dL — AB (ref 65–99)
Glucose-Capillary: 97 mg/dL (ref 65–99)
Glucose-Capillary: 213 mg/dL — ABNORMAL HIGH (ref 65–99)
Glucose-Capillary: 159 mg/dL — ABNORMAL HIGH (ref 65–99)

## 2015-09-09 LAB — VITAMIN B12: Vitamin B-12: 871 pg/mL (ref 180–914)

## 2015-09-09 LAB — FOLATE: FOLATE: 24.5 ng/mL (ref >5.9)

## 2015-09-09 MED ORDER — FAMOTIDINE 20 MG PO TABS
20.0000 mg | ORAL_TABLET | Freq: Two times a day (BID) | ORAL | Status: DC
  Administered 2015-09-09 – 2015-09-12 (×7): 20 mg via ORAL
  Filled 2015-09-09 (×8): qty 1

## 2015-09-09 MED ORDER — LORAZEPAM 0.5 MG PO TABS
0.5000 mg | ORAL_TABLET | Freq: Once | ORAL | Status: AC
  Administered 2015-09-09: 0.5 mg via ORAL
  Filled 2015-09-09: qty 1

## 2015-09-09 MED ORDER — PREDNISONE 20 MG PO TABS
40.0000 mg | ORAL_TABLET | Freq: Once | ORAL | Status: AC
  Administered 2015-09-09: 40 mg via ORAL
  Filled 2015-09-09: qty 2

## 2015-09-09 MED ORDER — LORAZEPAM 2 MG/ML IJ SOLN
1.0000 mg | Freq: Once | INTRAMUSCULAR | Status: AC
  Administered 2015-09-09: 1 mg via INTRAVENOUS
  Filled 2015-09-09: qty 1

## 2015-09-09 MED ORDER — LEVOFLOXACIN 750 MG PO TABS
750.0000 mg | ORAL_TABLET | Freq: Every day | ORAL | Status: DC
  Administered 2015-09-09 – 2015-09-10 (×2): 750 mg via ORAL
  Filled 2015-09-09 (×2): qty 1

## 2015-09-09 MED ORDER — PREDNISONE 20 MG PO TABS
30.0000 mg | ORAL_TABLET | Freq: Every day | ORAL | Status: DC
  Administered 2015-09-10 – 2015-09-12 (×3): 30 mg via ORAL
  Filled 2015-09-09 (×5): qty 1

## 2015-09-09 NOTE — Care Management Important Message (Signed)
Important Message  Patient Details  Name: Timothy Vaughn MRN: [id number] Date of Birth: 1929/05/08   Medicare Important Message Given:  Yes    Camillo Flaming 09/09/2015, 11:53 AMImportant Message  Patient Details  Name: Timothy Vaughn MRN: [id number] Date of Birth: 11-Oct-1928   Medicare Important Message Given:  Yes    Camillo Flaming 09/09/2015, 11:53 AM

## 2015-09-09 NOTE — Clinical Social Work Placement (Signed)
CSW confirmed with Nira Conn at [facility] that they would be able to take patient over the weekend if ready - will need the D/C Summary by 9:30am - Dr. Sanjuana Letters made aware.    Please call weekend CSW (ph#: [phone]) to facilitate discharge.      Raynaldo Opitz, [facility] Clinical Social Worker cell #: [phone]     CLINICAL SOCIAL WORK PLACEMENT  NOTE  Date:  09/09/2015  Patient Details  Name: Timothy Vaughn MRN: [id number] Date of Birth: [birth year]  Clinical Social Work is seeking post-discharge placement for this patient at the [place] level of care (*CSW will initial, date and re-position this form in  chart as items are completed):  Yes   Patient/family provided with [facility] Work Department's list of facilities offering this level of care within the geographic area requested by the patient (or if unable, by the patient's family).  Yes   Patient/family informed of their freedom to choose among providers that offer the needed level of care, that participate in Medicare, Medicaid or managed care program needed by the patient, have an available bed and are willing to accept the patient.  Yes   Patient/family informed of 's ownership interest in [facility] and [facility], as well as of the fact that they are under no obligation to receive care at these facilities.  PASRR submitted to EDS on 09/09/15     PASRR number received on 09/09/15     Existing PASRR number confirmed on       FL2 transmitted to all facilities in geographic area requested by pt/family on 09/09/15     FL2 transmitted to all facilities within larger geographic area on       Patient informed that his/her managed care company has contracts with or will negotiate with certain facilities, including the following:        Yes   Patient/family informed of bed offers received.  Patient chooses bed at [place],  [place]     Physician recommends and patient chooses bed at      Patient to be transferred to [place], [place] on  .  Patient to be transferred to facility by patient's son, Lanny Hurst     Patient family notified on   of transfer.  Name of family member notified:        PHYSICIAN       Additional Comment:    _______________________________________________ Standley Brooking, LCSW 09/09/2015, 4:12 PM

## 2015-09-09 NOTE — Clinical Social Work Note (Signed)
Clinical Social Work Assessment  Patient Details  Name: Timothy Vaughn MRN: [id number] Date of Birth: Mar 03, 1929  Date of referral:  09/09/15               Reason for consult:  Facility Placement                Permission sought to share information with:  Facility Art therapist granted to share information::  Yes, Verbal Permission Granted  Name::        Agency::     Relationship::     Contact Information:     Housing/Transportation Living arrangements for the past 2 months:  [place] of Information:  Adult Children, Patient Patient Interpreter Needed:  None Criminal Activity/Legal Involvement Pertinent to Current Situation/Hospitalization:  No - Comment as needed Significant Relationships:  None Lives with:  Spouse Do you feel safe going back to the place where you live?  No Need for family participation in patient care:  Yes (Comment)  Care giving concerns:  CSW received consult for SNF placement.    Social Worker assessment / plan:  CSW spoke with patient & son, Lanny Hurst at bedside re: SNF placement - they are requesting [facility].   Employment status:  Retired Forensic scientist:  Medicare PT Recommendations:  [place] / Referral to community resources:  [place]  Patient/Family's Response to care:  CSW confirmed with Nira Conn at [facility] that they would be able to take patient tomorrow (Sat 1/7), Dr. Sanjuana Letters aware. Patient & son were relieved to hear that [facility] would be able to take him.   Patient/Family's Understanding of and Emotional Response to Diagnosis, Current Treatment, and Prognosis:  Patient & son are pleased that he is doing well with his breathing, only needing O2 as needed.   Emotional Assessment Appearance:  Appears stated age Attitude/Demeanor/Rapport:    Affect (typically observed):    Orientation:    Alcohol / Substance use:    Psych involvement  (Current and /or in the community):     Discharge Needs  Concerns to be addressed:    Readmission within the last 30 days:    Current discharge risk:    Barriers to Discharge:      Standley Brooking, LCSW 09/09/2015, 4:09 PM

## 2015-09-09 NOTE — Progress Notes (Signed)
Physical Therapy Treatment Patient Details Name: DONNELLY LEGALL MRN: [id number] DOB: [birth year] Today's Date: 09/09/2015    History of Present Illness 82 y.o. male with restrictive lung disease due to severe kyphoscoliosis, brought to [facility] ED 01/03 due to persistent URI symptoms x 1 week. Admitted for hypoxic and hypercarbic resp failure to SDU, required BiPAP.    PT Comments    Pt ambulated 220' without an assistive device, no loss of balance. SaO2 on room air with walking 87-92%, 2/4 dyspnea. SaO2 on room air at rest 94%.   Follow Up Recommendations  SNF;Supervision for mobility/OOB     Equipment Recommendations  None recommended by PT    Recommendations for Other Services       Precautions / Restrictions Precautions Precautions: Fall Precaution Comments: monitor sats and HR, on Oxygen Restrictions Weight Bearing Restrictions: No    Mobility  Bed Mobility               General bed mobility comments: pt up in recliner on arrival  Transfers Overall transfer level: Needs assistance   Transfers: Sit to/from Stand Sit to Stand: Min guard         General transfer comment: verbal cues for hand placement, min/guard for safety  Ambulation/Gait Ambulation/Gait assistance: Min guard Ambulation Distance (Feet): 220 Feet Assistive device: None       General Gait Details: steady without AD, SaO2 87-92% on RA walking (with drop to 87% pt was encouraged to perform pursed lip breathing which quickly brought up SaO2 to 90's), wheezing/congested breath sounds expecially with expiration, encouraged incentive spirometer   Stairs            Wheelchair Mobility    Modified Rankin (Stroke Patients Only)       Balance     Sitting balance-Leahy Scale: Good       Standing balance-Leahy Scale: Good                      Cognition Arousal/Alertness: Awake/alert Behavior During Therapy: WFL for tasks assessed/performed Overall Cognitive Status:  Within Functional Limits for tasks assessed                      Exercises      General Comments        Pertinent Vitals/Pain Pain Assessment: No/denies pain    Home Living                      Prior Function            PT Goals (current goals can now be found in the care plan section) Acute Rehab PT Goals Patient Stated Goal: to get back to walking PT Goal Formulation: With patient/family Time For Goal Achievement: 09/21/15 Potential to Achieve Goals: Good Progress towards PT goals: Progressing toward goals    Frequency  Min 3X/week    PT Plan Current plan remains appropriate    Co-evaluation             End of Session Equipment Utilized During Treatment: Gait belt Activity Tolerance: Patient tolerated treatment well Patient left: with call bell/phone within reach;with family/visitor present (left in w/c with RN- to go to radiology)     Time: [id number] PT Time Calculation (min) (ACUTE ONLY): 25 min  Charges:  $Gait Training: 8-22 mins $Therapeutic Activity: 8-22 mins                    G  Codes:      Philomena Doheny 09/09/2015, 1:44 PM [phone]

## 2015-09-09 NOTE — Progress Notes (Signed)
Rehab admissions - Please see rehab consult done today by Dr. Posey Pronto recommending [facility] therapies.  Agree with need for home with [facility] or SNF placement.  [id number]

## 2015-09-09 NOTE — Progress Notes (Signed)
PHARMACIST - PHYSICIAN COMMUNICATION  Key Points: Use following P&T approved IV to PO antibiotic change policy.  Description contains the criteria that are approved Note: Policy Excludes:  Esophagectomy patients  DR:   [facility] CONCERNING: IV to Oral Route Change Policy  RECOMMENDATION: This patient is receiving famotidine by the intravenous route.  Based on criteria approved by the Pharmacy and Therapeutics Committee, the intravenous medication(s) is/are being converted to the equivalent oral dose form(s).   DESCRIPTION: These criteria include:  The patient is eating (either orally or via tube) and/or has been taking other orally administered medications for a least 24 hours  The patient has no evidence of active gastrointestinal bleeding or impaired GI absorption (gastrectomy, short bowel, patient on TNA or NPO).  If you have questions about this conversion, please contact the Pharmacy Department  []   [phone] )  [facility] []   [phone] )  [facility] []   [phone] )  [facility] []   [phone] )  [facility] [x]   [phone] )  [place], [place], [facility] 09/09/2015 9:43 AM

## 2015-09-09 NOTE — Progress Notes (Addendum)
Name: Timothy Vaughn MRN: [id number] DOB: [birth year]    ADMISSION DATE:  09/06/2015 CONSULTATION DATE:  09/06/15  REFERRING MD :  Tamala Julian  CHIEF COMPLAINT:  SOB  BRIEF PATIENT DESCRIPTION: 82 y.o. male with restrictive lung disease due to severe kyphoscoliosis, brought to [facility] ED 01/03 due to persistent URI symptoms x 1 week.  Saw PCP and was treated with abx, BD's, cough suppressants.  Admitted by [facility] for hypoxic and hypercarbic resp failure and after admission, remained hypersomnolent; therefore transferred to ICU and  PCCM called for further recs.  SIGNIFICANT EVENTS  09/01/15 > initially saw PCP, given BD's, z-pack, cough suppressant. 01/04 > back to PCP for persistent symptoms > sent to ED. 01/014 > admitted with hypoxic and hypercarbic respiratory failure > transferred to ICU for BiPAP and possible intubation.  STUDIES:  CXR 01/03 > no acute process. Severe dextroscoliosis.   SUBJECTIVE:  Feels better No BIPAP today Cough with chest congestion stil  VITAL SIGNS: Temp:  [97.9 F (36.6 C)-98.3 F (36.8 C)] 98.3 F (36.8 C) (01/06 0543) Pulse Rate:  [63-69] 63 (01/06 0543) Resp:  [20-23] 20 (01/06 0543) BP: (122-135)/(55-69) 129/69 mmHg (01/06 0543) SpO2:  [95 %-98 %] 96 % (01/06 0825)  PHYSICAL EXAMINATION: Gen: comfortable in chair, coughing HENT: MMM, OP clear PULM: Rhonchi bilaterally, normal effort, weak cough CV: JVD improved, no clear mgr GI: BS+, soft, nontender MSK: normal bulk and tone Derm: no rash or skin breakdown Neuro: A&Ox4, maew    Recent Labs Lab 09/07/15 0105 09/08/15 0335 09/09/15 0551  NA 145 143 142  K 4.0 3.7 4.5  CL 91* 88* 90*  CO2 42* 48* 45*  BUN 29* 37* 29*  CREATININE 1.00 1.02 0.77  GLUCOSE 248* 200* 106*    Recent Labs Lab 09/07/15 0105 09/08/15 0335 09/09/15 0551  HGB 16.0 14.3 13.8  HCT 51.7 45.3 46.0  WBC 5.7 10.7* 7.8  PLT 149* 133* 133*   Dg Chest 2 View  09/08/2015  CLINICAL DATA:  Acute respiratory failure,  hypoxemia EXAM: CHEST  2 VIEW COMPARISON:  09/07/2015 FINDINGS: Study is limited by patient rotation and significant levoscoliosis of lower cervical and thoracic spine. There is dextroscoliosis of the lumbar spine. No convincing pulmonary edema. Slight improvement in aeration. Streaky residual right basilar atelectasis or infiltrate. IMPRESSION: Limited study due to significant scoliosis and patient rotation. No convincing pulmonary edema. Slight improvement in aeration. Streaky residual right basilar atelectasis or infiltrate. Electronically Signed   By: Lahoma Crocker M.D.   On: 09/08/2015 11:44    ASSESSMENT / PLAN:  Acute on chronic hypercarbic respiratory failure - acute bronchitis and pulmonary edema on top of chronic hypercapnic respiratory failure from kyphosis. Overall improved but still has significant hypoxemia, chest congestion and weak cough. Ultimately will need BIPAP, but he is compensating well based on today's ABG Refuses BIPAP in hospital as he hates the mask Plan: Wean O2 for O2 saturation 90-94% Out of bed Ambulate today Flutter> gave instructions on proper use today IS Continue Levaquin 7 day course Continue guaifenesin Will need to f/u with pulmonary as I believe he will eventually need BIPAP > has appointment 1/27 with Tammy Parrett at 9:30 AM Pulmonary hygeine  Acute hypercarbic encephalopathy -related to hypercarbia > improved Plan: Treat hypercarbia No sedating Meds   HTN, HLD Plan: Continue outpatient lasix, pravastatin, ASA  Hypocalcemia Plan: Correct electrolytes as indicated  Agree with plans for SNF, but would ensure oxygen needs stabilizing, he is ambulating, and cough improving  Will be available PRN  Family updated bedside by me   Roselie Awkward, MD [place] [facility] Pager: [phone] Cell: [phone] After 3pm or if no response, call [phone]

## 2015-09-09 NOTE — Progress Notes (Signed)
[facility] PROGRESS NOTE  CHAMAR PARMENTIER [id number] DOB: 1929-04-15 DOA: 09/06/2015 PCP: Alesia Richards, MD  Summary 09/07/15: I have seen and examined Timothy Vaughn at bedside and reviewed his chart. Appreciate pulmonary. Timothy Vaughn is a pleasant 82 year old male with restrictive lung disease secondary to severe kyphoscoliosis; who presented from his primary care office with shortness of breath and confusion he was found to have acute hypoxic/hypercapnic respiratory failure possibly related to community-acquired pneumonia, resulting in acute metabolic encephalopathy. There is question whether his respiratory failure is related to worsening kyphoscoliosis. He is currently on broad-spectrum antibiotics/systemic steroids/BiPAP/bronchodilators. He will get 2-D echocardiogram today. He seems more lucid and is not sure how he ended up in the hospital. He remains nothing by mouth on BiPAP. Will continue current management with pulmonary help. 09/08/15: Appreciate pulmonary. 2-D echo showed normal ejection fraction/normal diastolic function and no wall motion abnormalities, moderate tricuspid regurgitation-therefore patient did not have CHF decompensation. His renal function is worsening with BUN/creatinine 37/1.02, likely from diuresis. Blood cultures remain negative. Patient has hyperglycemia in setting of history of "prediabetes"-mild DM2. Will change diet to cope consistent, obtain hemoglobin A1c and start sensitive sliding scale insulin. Will also gently rehydrate patient, mobilize per physical therapy, and transfer to MedSurg when bed becomes available. Patient is very much with it today and he is surprised at how weak he feels. He still has cough.  09/09/15: Appreciate pulmonary/inpatient rehabilitation. Patient much stronger but still coughing. He is more lucid. He and his son Timothy Vaughn say that "everyone has been sick in the family with cough" for the last 2 weeks. Patient actually received  Tamiflu by his PCP but was not getting better despite getting Zithromax also. He appears to be getting to his baseline as ABG done last night on O2 nasal canula showed pH 7.4/74/120. Wonder what his baseline PCO2 is. Will discontinue Ceftriaxone/Zithromax, start oral Levaquin and low-dose steroids as he continues to have cough. Await hemoglobin A1c results. Patient would rather go home with physical therapy if inpatient rehabilitation not an option. He doesn't want to go to skilled nursing facility. Will defer management of BiPAP to pulmonary. Plan Acute respiratory failure with hypoxia and hypercapnia (HCC)/Kyphoscoliosis/Restrictive lung disease due to kyphoscoliosis/Hypoxia/CAP (community acquired pneumonia)/Encephalopathy, metabolic/Elevated brain natriuretic peptide (BNP) level/acute kidney injury  Continue NS with kcl at 50cc/hr  Discontinue Ceftriaxone/Zithromax  Levaquin oral  Short course of Prednisone  Follow pulmonary recommendations Essential hypertension/Hyperlipidemia/moderate tricuspid regurgitation/diabetes mellitus type 2  Sugars appear generally controlled  Follow hemoglobin A1c  SSI  Continue consistent carb diet Pressure ulcer  Wound care as needed Code Status: Full Code Family Communication: Spoke with son Timothy Vaughn at the bedside. Disposition Plan: Inpatient rehabilitation/? Home with physical therapy eventually.   Consultants:  Pulmonary  Inpatient rehabilitation  Procedures:  BiPAP  Antibiotics:  Ceftriaxone 09/06/2015> 09/09/2015  Azithromycin 09/06/2015> 09/09/2015  Levaquin 09/09/2015>  HPI/Subjective: Feels better. He is not very clear about symptoms. Asking about going home.  Objective: Filed Vitals:   09/08/15 2047 09/09/15 0543  BP: 134/55 129/69  Pulse: 63 63  Temp: 98.2 F (36.8 C) 98.3 F (36.8 C)  Resp: 20 20    Intake/Output Summary (Last 24 hours) at 09/09/15 0931 Last data filed at 09/09/15 0300  Gross per 24 hour   Intake  53.33 ml  Output    150 ml  Net -96.67 ml   Filed Weights   09/06/15 1642 09/06/15 2146 09/08/15 0400  Weight: 63.504 kg (140 lb) 63.05 kg (139 lb) 62.1 kg (  136 lb 14.5 oz)    Exam:   General:  Comfortable at rest. Sitting up in chair. Coughing.  Cardiovascular: S1-S2 normal. No murmurs. Pulse regular.  Respiratory: Good air entry bilaterally. Scant  rhonchi bilaterally, no rales.  Abdomen: Soft and nontender. Normal bowel sounds. No organomegaly.  Musculoskeletal: No pedal edema   Neurological: Intact  Data Reviewed: Basic Metabolic Panel:  Recent Labs Lab 09/06/15 1710 09/07/15 0105 09/08/15 0335 09/09/15 0551  NA 142 145 143 142  K 3.7 4.0 3.7 4.5  CL 92* 91* 88* 90*  CO2 44* 42* 48* 45*  GLUCOSE 146* 248* 200* 106*  BUN 34* 29* 37* 29*  CREATININE 1.02 1.00 1.02 0.77  CALCIUM 8.5* 8.6* 8.0* 8.5*   Liver Function Tests:  Recent Labs Lab 09/06/15 1710 09/08/15 0335  AST 38 20  ALT 30 23  ALKPHOS 69 53  BILITOT 1.2 0.8  PROT 7.0 6.2*  ALBUMIN 3.9 3.3*   No results for input(s): LIPASE, AMYLASE in the last 168 hours. No results for input(s): AMMONIA in the last 168 hours. CBC:  Recent Labs Lab 09/06/15 1710 09/07/15 0105 09/08/15 0335 09/09/15 0551  WBC 7.6 5.7 10.7* 7.8  NEUTROABS 5.3  --  9.7*  --   HGB 15.4 16.0 14.3 13.8  HCT 48.7 51.7 45.3 46.0  MCV 102.5* 104.4* 101.3* 104.1*  PLT 155 149* 133* 133*   Cardiac Enzymes:  Recent Labs Lab 09/07/15 0310  TROPONINI <0.03   BNP (last 3 results)  Recent Labs  09/06/15 1558  BNP 145.9*    ProBNP (last 3 results) No results for input(s): PROBNP in the last 8760 hours.  CBG:  Recent Labs Lab 09/08/15 1143 09/08/15 1552 09/08/15 1643 09/08/15 2221 09/09/15 0723  GLUCAP 160* 128* 101* 137* 97    Recent Results (from the past 240 hour(s))  Culture, blood (routine x 2) Call MD if unable to obtain prior to antibiotics being given     Status: None (Preliminary  result)   Collection Time: 09/06/15 10:28 PM  Result Value Ref Range Status   Specimen Description BLOOD LEFT HAND  Final   Special Requests BOTTLES DRAWN AEROBIC AND ANAEROBIC 5CC  Final   Culture   Final    NO GROWTH 1 DAY Performed at [facility]    Report Status PENDING  Incomplete  Culture, blood (routine x 2) Call MD if unable to obtain prior to antibiotics being given     Status: None (Preliminary result)   Collection Time: 09/06/15 10:34 PM  Result Value Ref Range Status   Specimen Description BLOOD LEFT ARM  Final   Special Requests BOTTLES DRAWN AEROBIC AND ANAEROBIC [place]  Final   Culture   Final    NO GROWTH 1 DAY Performed at [facility]    Report Status PENDING  Incomplete  MRSA PCR Screening     Status: None   Collection Time: 09/07/15  9:09 AM  Result Value Ref Range Status   MRSA by PCR NEGATIVE NEGATIVE Final    Comment:        The GeneXpert MRSA Assay (FDA approved for NASAL specimens only), is one component of a comprehensive MRSA colonization surveillance program. It is not intended to diagnose MRSA infection nor to guide or monitor treatment for MRSA infections.      Studies: Dg Chest 2 View  09/08/2015  CLINICAL DATA:  Acute respiratory failure, hypoxemia EXAM: CHEST  2 VIEW COMPARISON:  09/07/2015 FINDINGS: Study is limited by  patient rotation and significant levoscoliosis of lower cervical and thoracic spine. There is dextroscoliosis of the lumbar spine. No convincing pulmonary edema. Slight improvement in aeration. Streaky residual right basilar atelectasis or infiltrate. IMPRESSION: Limited study due to significant scoliosis and patient rotation. No convincing pulmonary edema. Slight improvement in aeration. Streaky residual right basilar atelectasis or infiltrate. Electronically Signed   By: Lahoma Crocker M.D.   On: 09/08/2015 11:44    Scheduled Meds: . aspirin  81 mg Oral Daily  . cholecalciferol  1,000 Units Oral Daily  . enoxaparin  (LOVENOX) injection  40 mg Subcutaneous Q24H  . famotidine (PEPCID) IV  20 mg Intravenous Q12H  . guaiFENesin  1,200 mg Oral BID  . insulin aspart  0-9 Units Subcutaneous TID WC  . ipratropium-albuterol  3 mL Nebulization Q6H  . levofloxacin  750 mg Oral Daily  . multivitamin with minerals  1 tablet Oral Daily  . omega-3 acid ethyl esters  1 g Oral Daily  . potassium chloride SA  40 mEq Oral Daily  . pravastatin  40 mg Oral q1800  . vitamin C  125 mg Oral Daily   Continuous Infusions:    Time spent: 35 minutes    Zina Pitzer  [facility] Pager [phone]. If 7PM-7AM, please contact night-coverage at [email or web address], password [facility] 09/09/2015, 9:31 AM  LOS: 3 days

## 2015-09-09 NOTE — NC FL2 (Signed)
[place] LEVEL OF CARE SCREENING TOOL     IDENTIFICATION  Patient Name: Timothy Vaughn Birthdate: [birth year] Sex: male Admission Date (Current Location): 09/06/2015  [facility] and [place] Number:  Herbalist and Address:  [facility],  [place] [address], [place]      Provider Number: [id number]  Attending Physician Name and Address:  Nat Math, MD  Relative Name and Phone Number:       Current Level of Care: Hospital Recommended Level of Care: [place] Prior Approval Number:    Date Approved/Denied:   PASRR Number: [id number]  Discharge Plan: SNF    Current Diagnoses: Patient Active Problem List   Diagnosis Date Noted  . Tachypnea   . AKI (acute kidney injury) ([place]) 09/08/2015  . Moderate tricuspid regurgitation 09/08/2015  . Macrocytosis without anemia 09/08/2015  . Viral bronchitis   . Acute hypercapnic respiratory failure ([place]) 09/07/2015  . Acute respiratory failure with hypoxia and hypercapnia ([place]) 09/07/2015  . Elevated brain natriuretic peptide (BNP) level 09/07/2015  . Pressure ulcer 09/07/2015  . CAP (community acquired pneumonia) 09/07/2015  . Encephalopathy, metabolic [id number]  . Bronchitis   . Hypoxia 09/06/2015  . Medicare annual wellness visit, initial 04/12/2015  . Medication management 02/10/2014  . Essential hypertension 08/14/2013  . Hyperlipidemia 08/14/2013  . DM2 (diabetes mellitus, type 2) ([place]) 08/14/2013  . Vitamin D deficiency 08/14/2013  . Kyphoscoliosis 08/14/2013  . Restrictive lung disease due to kyphoscoliosis 08/14/2013    Orientation RESPIRATION BLADDER Height & Weight    Self, Time, Situation, Place  O2 (4L) Continent   136 lbs.  BEHAVIORAL SYMPTOMS/MOOD NEUROLOGICAL BOWEL NUTRITION STATUS      Continent Diet (Carb Modified)  AMBULATORY STATUS COMMUNICATION OF NEEDS Skin   Extensive Assist Verbally   (ressureUlcer01/03/17StageI-Intactskinwithnon-blanchablerednessofalocalizedareausuallyoverabonyprominence.slightreddnessonbottomcheek,moisturebarrierapplied.)                       Personal Care Assistance Level of Assistance  Bathing, Feeding, Dressing Bathing Assistance: Maximum assistance Feeding assistance: Limited assistance Dressing Assistance: Limited assistance     Functional Limitations Info             SPECIAL CARE FACTORS FREQUENCY  PT (By licensed PT), OT (By licensed OT)     PT Frequency: 5 OT Frequency: 5            Contractures      Additional Factors Info  Code Status, Allergies Code Status Info: Fullcode Allergies Info: Lipitor, Niaspan           Current Medications (09/09/2015):  This is the current hospital active medication list Current Facility-Administered Medications  Medication Dose Route Frequency Provider Last Rate Last Dose  . aspirin chewable tablet 81 mg  81 mg Oral Daily Rondell Charmayne Sheer, MD   81 mg at 09/09/15 1102  . cholecalciferol (VITAMIN D) tablet 1,000 Units  1,000 Units Oral Daily Norval Morton, MD   1,000 Units at 09/09/15 1102  . enoxaparin (LOVENOX) injection 40 mg  40 mg Subcutaneous Q24H Rondell A Tamala Julian, MD   40 mg at 09/08/15 2145  . famotidine (PEPCID) tablet 20 mg  20 mg Oral BID Berton Mount, RPH   20 mg at 09/09/15 1101  . guaiFENesin (MUCINEX) 12 hr tablet 1,200 mg  1,200 mg Oral BID Simbiso Ranga, MD   1,200 mg at 09/09/15 1101  . insulin aspart (novoLOG) injection 0-9 Units  0-9 Units Subcutaneous TID WC Nat Math, MD  3 Units at 09/09/15 1236  . ipratropium-albuterol (DUONEB) 0.5-2.5 (3) MG/3ML nebulizer solution 3 mL  3 mL Nebulization Q6H Rondell Charmayne Sheer, MD   3 mL at 09/09/15 0825  . ipratropium-albuterol (DUONEB) 0.5-2.5 (3) MG/3ML nebulizer solution 3 mL  3 mL Nebulization Q2H PRN Rondell A Tamala Julian, MD      . levofloxacin (LEVAQUIN) tablet 750 mg  750 mg Oral Daily Simbiso  Ranga, MD   750 mg at 09/09/15 1102  . lip balm (CARMEX) ointment   Topical PRN Simbiso Ranga, MD      . multivitamin with minerals tablet 1 tablet  1 tablet Oral Daily Norval Morton, MD   1 tablet at 09/09/15 1101  . omega-3 acid ethyl esters (LOVAZA) capsule 1 g  1 g Oral Daily Rondell Charmayne Sheer, MD   1 g at 09/09/15 1101  . pravastatin (PRAVACHOL) tablet 40 mg  40 mg Oral q1800 Norval Morton, MD   40 mg at 09/08/15 1737  . [START ON 09/10/2015] predniSONE (DELTASONE) tablet 30 mg  30 mg Oral Q breakfast Simbiso Ranga, MD      . vitamin C (ASCORBIC ACID) tablet 125 mg  125 mg Oral Daily Norval Morton, MD   125 mg at 09/09/15 1101     Discharge Medications: Please see discharge summary for a list of discharge medications.  Relevant Imaging Results:  Relevant Lab Results:   Additional Information SSN: [id number]  Standley Brooking, LCSW

## 2015-09-09 NOTE — Progress Notes (Signed)
[id number] davis,RN,BSN,CCM:  Spoke with the son and the patient concerning wishes to go to [facility] or SNF.  If [facility] does not accept would like to go to [facility] in [place] for rehab.

## 2015-09-10 ENCOUNTER — Other Ambulatory Visit: Payer: Self-pay | Admitting: Internal Medicine

## 2015-09-10 LAB — GLUCOSE, CAPILLARY
GLUCOSE-CAPILLARY: 127 mg/dL — AB (ref 65–99)
GLUCOSE-CAPILLARY: 167 mg/dL — AB (ref 65–99)
Glucose-Capillary: 95 mg/dL (ref 65–99)
Glucose-Capillary: 118 mg/dL — ABNORMAL HIGH (ref 65–99)

## 2015-09-10 LAB — CBC
HEMATOCRIT: 43.4 % (ref 39.0–52.0)
HEMOGLOBIN: 13.6 g/dL (ref 13.0–17.0)
MCH: 31.8 pg (ref 26.0–34.0)
MCHC: 31.3 g/dL (ref 30.0–36.0)
MCV: 101.4 fL — ABNORMAL HIGH (ref 78.0–100.0)
Platelets: 143 10*3/uL — ABNORMAL LOW (ref 150–400)
RBC: 4.28 MIL/uL (ref 4.22–5.81)
RDW: 12.9 % (ref 11.5–15.5)
WBC: 6.4 10*3/uL (ref 4.0–10.5)

## 2015-09-10 LAB — BASIC METABOLIC PANEL
Anion gap: 5 (ref 5–15)
BUN: 24 mg/dL — ABNORMAL HIGH (ref 6–20)
CHLORIDE: 95 mmol/L — AB (ref 101–111)
CO2: 43 mmol/L — AB (ref 22–32)
CREATININE: 0.87 mg/dL (ref 0.61–1.24)
Calcium: 8.7 mg/dL — ABNORMAL LOW (ref 8.9–10.3)
GFR calc non Af Amer: >60 mL/min (ref >60)
Glucose, Bld: 113 mg/dL — ABNORMAL HIGH (ref 65–99)
POTASSIUM: 4.4 mmol/L (ref 3.5–5.1)
Sodium: 143 mmol/L (ref 135–145)

## 2015-09-10 LAB — HEMOGLOBIN A1C
Hgb A1c MFr Bld: 6.7 % — ABNORMAL HIGH (ref 4.8–5.6)
MEAN PLASMA GLUCOSE: 146 mg/dL

## 2015-09-10 MED ORDER — PIPERACILLIN-TAZOBACTAM 3.375 G IVPB
3.3750 g | Freq: Three times a day (TID) | INTRAVENOUS | Status: DC
  Administered 2015-09-10 – 2015-09-12 (×7): 3.375 g via INTRAVENOUS
  Filled 2015-09-10 (×8): qty 50

## 2015-09-10 MED ORDER — FLORANEX PO PACK
1.0000 g | PACK | Freq: Three times a day (TID) | ORAL | Status: DC
  Filled 2015-09-10 (×3): qty 1

## 2015-09-10 MED ORDER — LACTINEX PO CHEW
1.0000 | CHEWABLE_TABLET | Freq: Three times a day (TID) | ORAL | Status: DC
  Administered 2015-09-10 – 2015-09-12 (×7): 1 via ORAL
  Filled 2015-09-10 (×10): qty 1

## 2015-09-10 MED ORDER — ZOLPIDEM TARTRATE 5 MG PO TABS
5.0000 mg | ORAL_TABLET | Freq: Once | ORAL | Status: AC
  Administered 2015-09-10: 5 mg via ORAL
  Filled 2015-09-10: qty 1

## 2015-09-10 MED ORDER — AMOXICILLIN-POT CLAVULANATE 875-125 MG PO TABS: 1.0000 | ORAL_TABLET | Freq: Two times a day (BID) | ORAL | Status: DC

## 2015-09-10 MED ORDER — ALBUTEROL SULFATE (2.5 MG/3ML) 0.083% IN NEBU
2.5000 mg | INHALATION_SOLUTION | RESPIRATORY_TRACT | Status: DC | PRN
  Administered 2015-09-11: 2.5 mg via RESPIRATORY_TRACT
  Filled 2015-09-10: qty 3

## 2015-09-10 NOTE — Clinical Social Work Note (Signed)
CSW spoke with MD who stated that pt will not be ready for discharge today and son is aware.  CSW called and let [facility] know that pt will not be ready for d/c today.  Pt may be ready tomorrow or Monday.  Dede Query, LCSW [place] Worker - Weekend Coverage cell #: [phone]

## 2015-09-10 NOTE — Progress Notes (Signed)
[facility] PROGRESS NOTE  Timothy Vaughn [id number] DOB: 1929-01-24 DOA: 09/06/2015 PCP: Alesia Richards, MD  Summary 09/07/15: I have seen and examined Timothy Vaughn at bedside and reviewed his chart. Appreciate pulmonary. Timothy Vaughn is a pleasant 82 year old male with restrictive lung disease secondary to severe kyphoscoliosis; who presented from his primary care office with shortness of breath and confusion he was found to have acute hypoxic/hypercapnic respiratory failure possibly related to community-acquired pneumonia, resulting in acute metabolic encephalopathy. There is question whether his respiratory failure is related to worsening kyphoscoliosis. He is currently on broad-spectrum antibiotics/systemic steroids/BiPAP/bronchodilators. He will get 2-D echocardiogram today. He seems more lucid and is not sure how he ended up in the hospital. He remains nothing by mouth on BiPAP. Will continue current management with pulmonary help. 09/08/15: Appreciate pulmonary. 2-D echo showed normal ejection fraction/normal diastolic function and no wall motion abnormalities, moderate tricuspid regurgitation-therefore patient did not have CHF decompensation. His renal function is worsening with BUN/creatinine 37/1.02, likely from diuresis. Blood cultures remain negative. Patient has hyperglycemia in setting of history of "prediabetes"-mild DM2. Will change diet to cope consistent, obtain hemoglobin A1c and start sensitive sliding scale insulin. Will also gently rehydrate patient, mobilize per physical therapy, and transfer to MedSurg when bed becomes available. Patient is very much with it today and he is surprised at how weak he feels. He still has cough.  09/09/15: Appreciate pulmonary/inpatient rehabilitation. Patient much stronger but still coughing. He is more lucid. He and his son Timothy Vaughn say that "everyone has been sick in the family with cough" for the last 2 weeks. Patient actually received  Tamiflu by his PCP but was not getting better despite getting Zithromax also. He appears to be getting to his baseline as ABG done last night on O2 nasal canula showed pH 7.4/74/120. Wonder what his baseline PCO2 is. Will discontinue Ceftriaxone/Zithromax, start oral Levaquin and low-dose steroids as he continues to have cough. Await hemoglobin A1c results. Patient would rather go home with physical therapy if inpatient rehabilitation not an option. He doesn't want to go to skilled nursing facility. Will defer management of BiPAP to pulmonary. 09/10/15: Patient still with bovine like cough. He may be aspirating. Will d/c levaquin for now and give Zosyn, with plans to transition to Augmentin at d/c. Discussed the care plan with patient's son Timothy Vaughn over the phone. Plan Acute respiratory failure with hypoxia and hypercapnia (HCC)/Kyphoscoliosis/Restrictive lung disease due to kyphoscoliosis/Hypoxia/CAP (community acquired pneumonia)/Encephalopathy, metabolic/Elevated brain natriuretic peptide (BNP) level/acute kidney injury  Discontinue Levaquin  Zosyn  Continue Prednisone  Follow pulmonary recommendations Essential hypertension/Hyperlipidemia/moderate tricuspid regurgitation/diabetes mellitus type 2  Sugars appear generally controlled  Follow hemoglobin A1c  SSI  Continue consistent carb diet Pressure ulcer  Wound care as needed Code Status: Full Code Family Communication: Spoke with son Timothy Vaughn at the bedside. Disposition Plan: Inpatient rehabilitation/? Home with physical therapy eventually.   Consultants:  Pulmonary  Inpatient rehabilitation  Procedures:  BiPAP  Antibiotics:  Ceftriaxone 09/06/2015> 09/09/2015  Azithromycin 09/06/2015> 09/09/2015  Levaquin 09/09/2015>09/10/2015  Zosyn 09/10/2015  HPI/Subjective: Patient has no specific complaints  Objective: Filed Vitals:   09/09/15 2059 09/10/15 0434  BP: 130/110 153/69  Pulse: 78 82  Temp: 98.5 F (36.9 C) 98.1  F (36.7 C)  Resp:  18    Intake/Output Summary (Last 24 hours) at 09/10/15 0959 Last data filed at 09/10/15 [id number]  Gross per 24 hour  Intake    120 ml  Output    250 ml  Net   -  130 ml   Filed Weights   09/06/15 1642 09/06/15 2146 09/08/15 0400  Weight: 63.504 kg (140 lb) 63.05 kg (139 lb) 62.1 kg (136 lb 14.5 oz)    Exam:   General:  Comfortable at rest.  Cardiovascular: S1-S2 normal. No murmurs. Pulse regular.  Respiratory: Good air entry bilaterally. No rhonchi or rales.  Abdomen: Soft and nontender. Normal bowel sounds. No organomegaly.  Musculoskeletal: No pedal edema   Neurological: Intact  Data Reviewed: Basic Metabolic Panel:  Recent Labs Lab 09/06/15 1710 09/07/15 0105 09/08/15 0335 09/09/15 0551 09/10/15 0525  NA 142 145 143 142 143  K 3.7 4.0 3.7 4.5 4.4  CL 92* 91* 88* 90* 95*  CO2 44* 42* 48* 45* 43*  GLUCOSE 146* 248* 200* 106* 113*  BUN 34* 29* 37* 29* 24*  CREATININE 1.02 1.00 1.02 0.77 0.87  CALCIUM 8.5* 8.6* 8.0* 8.5* 8.7*   Liver Function Tests:  Recent Labs Lab 09/06/15 1710 09/08/15 0335  AST 38 20  ALT 30 23  ALKPHOS 69 53  BILITOT 1.2 0.8  PROT 7.0 6.2*  ALBUMIN 3.9 3.3*   No results for input(s): LIPASE, AMYLASE in the last 168 hours. No results for input(s): AMMONIA in the last 168 hours. CBC:  Recent Labs Lab 09/06/15 1710 09/07/15 0105 09/08/15 0335 09/09/15 0551 09/10/15 0525  WBC 7.6 5.7 10.7* 7.8 6.4  NEUTROABS 5.3  --  9.7*  --   --   HGB 15.4 16.0 14.3 13.8 13.6  HCT 48.7 51.7 45.3 46.0 43.4  MCV 102.5* 104.4* 101.3* 104.1* 101.4*  PLT 155 149* 133* 133* 143*   Cardiac Enzymes:  Recent Labs Lab 09/07/15 0310  TROPONINI <0.03   BNP (last 3 results)  Recent Labs  09/06/15 1558  BNP 145.9*    ProBNP (last 3 results) No results for input(s): PROBNP in the last 8760 hours.  CBG:  Recent Labs Lab 09/09/15 0723 09/09/15 1134 09/09/15 1639 09/09/15 2201 09/10/15 0730  GLUCAP 97 213*  107* 159* 95    Recent Results (from the past 240 hour(s))  Culture, blood (routine x 2) Call MD if unable to obtain prior to antibiotics being given     Status: None (Preliminary result)   Collection Time: 09/06/15 10:28 PM  Result Value Ref Range Status   Specimen Description BLOOD LEFT HAND  Final   Special Requests BOTTLES DRAWN AEROBIC AND ANAEROBIC 5CC  Final   Culture   Final    NO GROWTH 2 DAYS Performed at [facility]    Report Status PENDING  Incomplete  Culture, blood (routine x 2) Call MD if unable to obtain prior to antibiotics being given     Status: None (Preliminary result)   Collection Time: 09/06/15 10:34 PM  Result Value Ref Range Status   Specimen Description BLOOD LEFT ARM  Final   Special Requests BOTTLES DRAWN AEROBIC AND ANAEROBIC [place]  Final   Culture   Final    NO GROWTH 2 DAYS Performed at [facility]    Report Status PENDING  Incomplete  MRSA PCR Screening     Status: None   Collection Time: 09/07/15  9:09 AM  Result Value Ref Range Status   MRSA by PCR NEGATIVE NEGATIVE Final    Comment:        The GeneXpert MRSA Assay (FDA approved for NASAL specimens only), is one component of a comprehensive MRSA colonization surveillance program. It is not intended to diagnose MRSA infection nor to guide  or monitor treatment for MRSA infections.      Studies: Dg Chest 2 View  09/08/2015  CLINICAL DATA:  Acute respiratory failure, hypoxemia EXAM: CHEST  2 VIEW COMPARISON:  09/07/2015 FINDINGS: Study is limited by patient rotation and significant levoscoliosis of lower cervical and thoracic spine. There is dextroscoliosis of the lumbar spine. No convincing pulmonary edema. Slight improvement in aeration. Streaky residual right basilar atelectasis or infiltrate. IMPRESSION: Limited study due to significant scoliosis and patient rotation. No convincing pulmonary edema. Slight improvement in aeration. Streaky residual right basilar atelectasis or  infiltrate. Electronically Signed   By: Lahoma Crocker M.D.   On: 09/08/2015 11:44    Scheduled Meds: . aspirin  81 mg Oral Daily  . cholecalciferol  1,000 Units Oral Daily  . enoxaparin (LOVENOX) injection  40 mg Subcutaneous Q24H  . famotidine  20 mg Oral BID  . guaiFENesin  1,200 mg Oral BID  . insulin aspart  0-9 Units Subcutaneous TID WC  . ipratropium-albuterol  3 mL Nebulization Q6H  . lactobacillus acidophilus & bulgar  1 tablet Oral TID WC  . multivitamin with minerals  1 tablet Oral Daily  . omega-3 acid ethyl esters  1 g Oral Daily  . piperacillin-tazobactam (ZOSYN)  IV  3.375 g Intravenous Q8H  . pravastatin  40 mg Oral q1800  . predniSONE  30 mg Oral Q breakfast  . vitamin C  125 mg Oral Daily   Continuous Infusions:    Time spent: 15 minutes    Ashritha Desrosiers  [facility] Pager [phone]. If 7PM-7AM, please contact night-coverage at [email or web address], password [facility] 09/10/2015, 9:59 AM  LOS: 4 days

## 2015-09-11 LAB — GLUCOSE, CAPILLARY
GLUCOSE-CAPILLARY: 134 mg/dL — AB (ref 65–99)
Glucose-Capillary: 83 mg/dL (ref 65–99)
Glucose-Capillary: 109 mg/dL — ABNORMAL HIGH (ref 65–99)
Glucose-Capillary: 193 mg/dL — ABNORMAL HIGH (ref 65–99)

## 2015-09-11 MED ORDER — ZOLPIDEM TARTRATE 5 MG PO TABS
5.0000 mg | ORAL_TABLET | Freq: Every evening | ORAL | Status: DC | PRN
  Administered 2015-09-11: 5 mg via ORAL
  Filled 2015-09-11: qty 1

## 2015-09-11 MED ORDER — FUROSEMIDE 20 MG PO TABS
20.0000 mg | ORAL_TABLET | Freq: Every day | ORAL | Status: DC
  Administered 2015-09-11 – 2015-09-12 (×2): 20 mg via ORAL
  Filled 2015-09-11 (×2): qty 1

## 2015-09-11 MED ORDER — BENZONATATE 100 MG PO CAPS: 200.0000 mg | ORAL_CAPSULE | Freq: Three times a day (TID) | ORAL | Status: DC | PRN

## 2015-09-11 NOTE — Progress Notes (Signed)
[facility] PROGRESS NOTE  Timothy Vaughn [id number] DOB: [birth year] DOA: 09/06/2015 PCP: Alesia Richards, MD  Summary 09/07/15: I have seen and examined Mr Timothy Vaughn at bedside and reviewed his chart. Appreciate pulmonary. Timothy Vaughn is a pleasant 82 year old male with restrictive lung disease secondary to severe kyphoscoliosis; who presented from his primary care office with shortness of breath and confusion he was found to have acute hypoxic/hypercapnic respiratory failure possibly related to community-acquired pneumonia, resulting in acute metabolic encephalopathy. There is question whether his respiratory failure is related to worsening kyphoscoliosis. He is currently on broad-spectrum antibiotics/systemic steroids/BiPAP/bronchodilators. He will get 2-D echocardiogram today. He seems more lucid and is not sure how he ended up in the hospital. He remains nothing by mouth on BiPAP. Will continue current management with pulmonary help. 09/08/15: Appreciate pulmonary. 2-D echo showed normal ejection fraction/normal diastolic function and no wall motion abnormalities, moderate tricuspid regurgitation-therefore patient did not have CHF decompensation. His renal function is worsening with BUN/creatinine 37/1.02, likely from diuresis. Blood cultures remain negative. Patient has hyperglycemia in setting of history of "prediabetes"-mild DM2. Will change diet to cope consistent, obtain hemoglobin A1c and start sensitive sliding scale insulin. Will also gently rehydrate patient, mobilize per physical therapy, and transfer to MedSurg when bed becomes available. Patient is very much with it today and he is surprised at how weak he feels. He still has cough.  09/09/15: Appreciate pulmonary/inpatient rehabilitation. Patient much stronger but still coughing. He is more lucid. He and his son Timothy Vaughn say that "everyone has been sick in the family with cough" for the last 2 weeks. Patient actually received  Tamiflu by his PCP but was not getting better despite getting Zithromax also. He appears to be getting to his baseline as ABG done last night on O2 nasal canula showed pH 7.4/74/120. Wonder what his baseline PCO2 is. Will discontinue Ceftriaxone/Zithromax, start oral Levaquin and low-dose steroids as he continues to have cough. Await hemoglobin A1c results. Patient would rather go home with physical therapy if inpatient rehabilitation not an option. He doesn't want to go to skilled nursing facility. Will defer management of BiPAP to pulmonary. 09/10/15: Patient still with bovine like cough. He may be aspirating. Will d/c levaquin for now and give Zosyn, with plans to transition to Augmentin at d/c. Discussed the care plan with patient's son Timothy Vaughn over the phone. 09/11/15: Cough improved. Patient on 1 L nasal cannula but appears to be doing okay off oxygen. We'll therefore discontinue oxygen and check nocturnal pulse oximetry. He will need sleep study for pulmonary outpatient. Will continue Zosyn/prednisone, resume low dose Lasix, and hopefully DC to skilled nursing facility/home tomorrow if he continues to do well. Plan Acute respiratory failure with hypoxia and hypercapnia (HCC)/Kyphoscoliosis/Restrictive lung disease due to kyphoscoliosis/Hypoxia/CAP (community acquired pneumonia)/Encephalopathy, metabolic/Elevated brain natriuretic peptide (BNP) level/acute kidney injury  Day 2 Zosyn  Continue Prednisone  Resume low-dose Lasix  Nocturnal pulse ox  Follow pulmonary recommendations Essential hypertension/Hyperlipidemia/moderate tricuspid regurgitation/diabetes mellitus type 2  Sugars appear generally controlled  Follow hemoglobin A1c  SSI  Continue consistent carb diet Pressure ulcer  Wound care as needed Code Status: Full Code Family Communication: Spoke with son Timothy Vaughn at the bedside. Disposition Plan: Inpatient rehabilitation/? Home with physical therapy  eventually.   Consultants:  Pulmonary  Inpatient rehabilitation  Procedures:  BiPAP  Antibiotics:  Ceftriaxone 09/06/2015> 09/09/2015  Azithromycin 09/06/2015> 09/09/2015  Levaquin 09/09/2015>09/10/2015  Zosyn 09/10/2015>  HPI/Subjective: Has no complaints. Says he feels about the same as yesterday.  Objective: Filed  Vitals:   09/10/15 2105 09/11/15 0550  BP: 132/62 127/66  Pulse: 72 73  Temp: 98.6 F (37 C) 98.5 F (36.9 C)  Resp: 20 20    Intake/Output Summary (Last 24 hours) at 09/11/15 1102 Last data filed at 09/11/15 0900  Gross per 24 hour  Intake    720 ml  Output    800 ml  Net    -80 ml   Filed Weights   09/06/15 1642 09/06/15 2146 09/08/15 0400  Weight: 63.504 kg (140 lb) 63.05 kg (139 lb) 62.1 kg (136 lb 14.5 oz)    Exam:   General:  Comfortable at rest.  Cardiovascular: S1-S2 normal. No murmurs. Pulse regular.  Respiratory: Good air entry bilaterally. No rhonchi or rales.  Abdomen: Soft and nontender. Normal bowel sounds. No organomegaly.  Musculoskeletal: No pedal edema   Neurological: Intact  Data Reviewed: Basic Metabolic Panel:  Recent Labs Lab 09/06/15 1710 09/07/15 0105 09/08/15 0335 09/09/15 0551 09/10/15 0525  NA 142 145 143 142 143  K 3.7 4.0 3.7 4.5 4.4  CL 92* 91* 88* 90* 95*  CO2 44* 42* 48* 45* 43*  GLUCOSE 146* 248* 200* 106* 113*  BUN 34* 29* 37* 29* 24*  CREATININE 1.02 1.00 1.02 0.77 0.87  CALCIUM 8.5* 8.6* 8.0* 8.5* 8.7*   Liver Function Tests:  Recent Labs Lab 09/06/15 1710 09/08/15 0335  AST 38 20  ALT 30 23  ALKPHOS 69 53  BILITOT 1.2 0.8  PROT 7.0 6.2*  ALBUMIN 3.9 3.3*   No results for input(s): LIPASE, AMYLASE in the last 168 hours. No results for input(s): AMMONIA in the last 168 hours. CBC:  Recent Labs Lab 09/06/15 1710 09/07/15 0105 09/08/15 0335 09/09/15 0551 09/10/15 0525  WBC 7.6 5.7 10.7* 7.8 6.4  NEUTROABS 5.3  --  9.7*  --   --   HGB 15.4 16.0 14.3 13.8 13.6  HCT  48.7 51.7 45.3 46.0 43.4  MCV 102.5* 104.4* 101.3* 104.1* 101.4*  PLT 155 149* 133* 133* 143*   Cardiac Enzymes:  Recent Labs Lab 09/07/15 0310  TROPONINI <0.03   BNP (last 3 results)  Recent Labs  09/06/15 1558  BNP 145.9*    ProBNP (last 3 results) No results for input(s): PROBNP in the last 8760 hours.  CBG:  Recent Labs Lab 09/10/15 0730 09/10/15 1151 09/10/15 1633 09/10/15 2103 09/11/15 0731  GLUCAP 95 118* 127* 167* 83    Recent Results (from the past 240 hour(s))  Culture, blood (routine x 2) Call MD if unable to obtain prior to antibiotics being given     Status: None (Preliminary result)   Collection Time: 09/06/15 10:28 PM  Result Value Ref Range Status   Specimen Description BLOOD LEFT HAND  Final   Special Requests BOTTLES DRAWN AEROBIC AND ANAEROBIC 5CC  Final   Culture   Final    NO GROWTH 3 DAYS Performed at [facility]    Report Status PENDING  Incomplete  Culture, blood (routine x 2) Call MD if unable to obtain prior to antibiotics being given     Status: None (Preliminary result)   Collection Time: 09/06/15 10:34 PM  Result Value Ref Range Status   Specimen Description BLOOD LEFT ARM  Final   Special Requests BOTTLES DRAWN AEROBIC AND ANAEROBIC [place]  Final   Culture   Final    NO GROWTH 3 DAYS Performed at [facility]    Report Status PENDING  Incomplete  MRSA PCR Screening  Status: None   Collection Time: 09/07/15  9:09 AM  Result Value Ref Range Status   MRSA by PCR NEGATIVE NEGATIVE Final    Comment:        The GeneXpert MRSA Assay (FDA approved for NASAL specimens only), is one component of a comprehensive MRSA colonization surveillance program. It is not intended to diagnose MRSA infection nor to guide or monitor treatment for MRSA infections.      Studies: No results found.  Scheduled Meds: . aspirin  81 mg Oral Daily  . cholecalciferol  1,000 Units Oral Daily  . enoxaparin (LOVENOX) injection  40  mg Subcutaneous Q24H  . famotidine  20 mg Oral BID  . furosemide  20 mg Oral Daily  . guaiFENesin  1,200 mg Oral BID  . insulin aspart  0-9 Units Subcutaneous TID WC  . ipratropium-albuterol  3 mL Nebulization Q6H  . lactobacillus acidophilus & bulgar  1 tablet Oral TID WC  . multivitamin with minerals  1 tablet Oral Daily  . omega-3 acid ethyl esters  1 g Oral Daily  . piperacillin-tazobactam (ZOSYN)  IV  3.375 g Intravenous Q8H  . pravastatin  40 mg Oral q1800  . predniSONE  30 mg Oral Q breakfast  . vitamin C  125 mg Oral Daily   Continuous Infusions:    Time spent: 25 minutes    Pavielle Biggar  [facility] Pager [phone]. If 7PM-7AM, please contact night-coverage at [email or web address], password [facility] 09/11/2015, 11:02 AM  LOS: 5 days

## 2015-09-12 LAB — CULTURE, BLOOD (ROUTINE X 2)
CULTURE: NO GROWTH
Culture: NO GROWTH

## 2015-09-12 LAB — CBC
HCT: 44.9 % (ref 39.0–52.0)
Hemoglobin: 14.5 g/dL (ref 13.0–17.0)
MCH: 31.9 pg (ref 26.0–34.0)
MCHC: 32.3 g/dL (ref 30.0–36.0)
MCV: 98.7 fL (ref 78.0–100.0)
PLATELETS: 155 10*3/uL (ref 150–400)
RBC: 4.55 MIL/uL (ref 4.22–5.81)
RDW: 13 % (ref 11.5–15.5)
WBC: 7.5 10*3/uL (ref 4.0–10.5)

## 2015-09-12 LAB — BASIC METABOLIC PANEL
ANION GAP: 9 (ref 5–15)
BUN: 31 mg/dL — ABNORMAL HIGH (ref 6–20)
CO2: 39 mmol/L — ABNORMAL HIGH (ref 22–32)
CREATININE: 0.95 mg/dL (ref 0.61–1.24)
Calcium: 9.2 mg/dL (ref 8.9–10.3)
Chloride: 94 mmol/L — ABNORMAL LOW (ref 101–111)
GFR calc Af Amer: >60 mL/min (ref >60)
GFR calc non Af Amer: >60 mL/min (ref >60)
Glucose, Bld: 102 mg/dL — ABNORMAL HIGH (ref 65–99)
POTASSIUM: 4 mmol/L (ref 3.5–5.1)
SODIUM: 142 mmol/L (ref 135–145)

## 2015-09-12 LAB — GLUCOSE, CAPILLARY
GLUCOSE-CAPILLARY: 79 mg/dL (ref 65–99)
Glucose-Capillary: 112 mg/dL — ABNORMAL HIGH (ref 65–99)

## 2015-09-12 MED ORDER — BENZONATATE 200 MG PO CAPS: 200.0000 mg | ORAL_CAPSULE | Freq: Three times a day (TID) | ORAL | Status: DC | PRN

## 2015-09-12 MED ORDER — FAMOTIDINE 20 MG PO TABS: 20.0000 mg | ORAL_TABLET | Freq: Two times a day (BID) | ORAL | Status: DC

## 2015-09-12 MED ORDER — GUAIFENESIN ER 600 MG PO TB12: 1200.0000 mg | ORAL_TABLET | Freq: Two times a day (BID) | ORAL | Status: DC

## 2015-09-12 MED ORDER — PREDNISONE 10 MG PO TABS: ORAL_TABLET | ORAL | Status: DC

## 2015-09-12 MED ORDER — LEVOFLOXACIN 750 MG PO TABS: 750.0000 mg | ORAL_TABLET | Freq: Every day | ORAL | Status: DC

## 2015-09-12 MED ORDER — LACTINEX PO CHEW: 1.0000 | CHEWABLE_TABLET | Freq: Three times a day (TID) | ORAL | Status: DC

## 2015-09-12 NOTE — Discharge Summary (Signed)
Timothy Vaughn, is a 82 y.o. male  DOB 1929/05/28  MRN [id number].  Admission date:  09/06/2015  Admitting Physician  Norval Morton, MD  Discharge Date:  09/12/2015   Primary MD  Alesia Richards, MD  Recommendations for primary care physician for things to follow:  Follow pulmonary for sleep study.   Admission Diagnosis   Bronchitis [J40] Hypoxia [R09.02] Reactive airway disease, unspecified asthma severity, with acute exacerbation [J45.901]   Discharge Diagnosis  Bronchitis [J40] Hypoxia [R09.02] Reactive airway disease, unspecified asthma severity, with acute exacerbation [J45.901]   Active Problems:   Kyphoscoliosis   Restrictive lung disease due to kyphoscoliosis   Hypoxia   Essential hypertension   Hyperlipidemia   Elevated brain natriuretic peptide (BNP) level   Pressure ulcer   Moderate tricuspid regurgitation   Macrocytosis without anemia  Summary Timothy Vaughn is a pleasant 82 year old male with restrictive lung disease secondary to severe kyphoscoliosis/essential hypertension/hyperlipidemia/moderate tricuspid regurgitation, who came in with shortness of breath and confusion and was found to have acute metabolic encephalopathy related to acute hypoxic/hypercapnic respiratory failure possibly related to community-acquired pneumonia preceded by viral syndrome.   There was question whether respiratory failure was related to worsening kyphoscoliosis. He was seen by pulmonary who helped with his care, and pulmonary recommended evaluation possible CPAP. Patient had 2-D echocardiogram which showed normal ejection fraction without wall motion abnormalities but some moderate tricuspid regurgitation. Septic workup was unremarkable. Of note is that his family has been sick with "the virus" in recent  weeks. Patient received diuretics/broad-spectrum antibiotics/bronchodilators/systemic steroids/oxygen supplementation as needed/BiPAP and he has improved. He feels strong enough to go home instead of skilled nursing facility and he will have home health services set up. He will need to follow with pulmonary for sleep study. Patient should complete 3 more days of Levaquin and tapering dose of prednisone. He needs oxygen at night since his pulse oximetry dropped to below 88% in the night. I have discussed the plan of care with his very attentive son at the bedside. Please refer to their day-to-day progress notes below for details of patient's hospital stay. Hospital Course  09/07/15: I have seen and examined Timothy Vaughn at bedside and reviewed his chart. Appreciate pulmonary. Timothy Vaughn is a pleasant 82 year old male with restrictive lung disease secondary to severe kyphoscoliosis; who presented from his primary care office with shortness of breath and confusion he was found to have acute hypoxic/hypercapnic respiratory failure possibly related to community-acquired pneumonia, resulting in acute metabolic encephalopathy. There is question whether his respiratory failure is related to worsening kyphoscoliosis. He is currently on broad-spectrum antibiotics/systemic steroids/BiPAP/bronchodilators. He will get 2-D echocardiogram today. He seems more lucid and is not sure how he ended up in the hospital. He remains nothing by mouth on BiPAP. Will continue current management with pulmonary help. 09/08/15: Appreciate pulmonary. 2-D echo showed normal ejection fraction/normal diastolic function and no wall motion abnormalities, moderate tricuspid regurgitation-therefore patient did not have CHF decompensation. His renal function is worsening with BUN/creatinine 37/1.02, likely from diuresis.  Blood cultures remain negative. Patient has hyperglycemia in setting of history of "prediabetes"-mild DM2. Will change diet to cope  consistent, obtain hemoglobin A1c and start sensitive sliding scale insulin. Will also gently rehydrate patient, mobilize per physical therapy, and transfer to MedSurg when bed becomes available. Patient is very much with it today and he is surprised at how weak he feels. He still has cough.  09/09/15: Appreciate pulmonary/inpatient rehabilitation. Patient much stronger but still coughing. He is more lucid. He and his son Timothy Vaughn say that "everyone has been sick in the family with cough" for the last 2 weeks. Patient actually received Tamiflu by his PCP but was not getting better despite getting Zithromax also. He appears to be getting to his baseline as ABG done last night on O2 nasal canula showed pH 7.4/74/120. Wonder what his baseline PCO2 is. Will discontinue Ceftriaxone/Zithromax, start oral Levaquin and low-dose steroids as he continues to have cough. Await hemoglobin A1c results. Patient would rather go home with physical therapy if inpatient rehabilitation not an option. He doesn't want to go to skilled nursing facility. Will defer management of BiPAP to pulmonary. 09/10/15: Patient still with bovine like cough. He may be aspirating. Will d/c levaquin for now and give Zosyn, with plans to transition to Augmentin at d/c. Discussed the care plan with patient's son Timothy Vaughn over the phone. 09/11/15: Cough improved. Patient on 1 L nasal cannula but appears to be doing okay off oxygen. We'll therefore discontinue oxygen and check nocturnal pulse oximetry. He will need sleep study for pulmonary outpatient. Will continue Zosyn/prednisone, resume low dose Lasix, and hopefully DC to skilled nursing facility/home tomorrow if he continues to do well. 09/12/15: Feels better. His pulse oximetry dropped below 88% in the night. He will need oxygen Nasal cannula at night-2L. Patient would rather go home instead of skilled nursing facility and will discharge today.   Discharge Condition Stable.  Consults obtained   None  Follow UP Follow-up Information    Follow up with PARRETT,TAMMY, NP On 09/30/2015.   Specialty:  Pulmonary Disease   Why:  respiratory failure, hypercapnea   Contact information:   [address] [address] [phone]         Discharge Instructions  and  Discharge Medications  Discharge Instructions    Diet - low sodium heart healthy    Complete by:  As directed      Discharge instructions    Complete by:  As directed   Please follow pulmonary in 2 weeks for sleep study, Follow PCP in 1-2 weeks.     Increase activity slowly    Complete by:  As directed             Medication List    STOP taking these medications        azithromycin 250 MG tablet  Commonly known as:  ZITHROMAX      TAKE these medications        Albuterol Sulfate 108 (90 Base) MCG/ACT Aepb  Commonly known as:  PROAIR RESPICLICK  Inhale 2 Act into the lungs every 6 (six) hours as needed (for shortness of breath).     aspirin 81 MG tablet  Take 81 mg by mouth daily.     benzonatate 200 MG capsule  Commonly known as:  TESSALON  Take 1 capsule (200 mg total) by mouth 3 (three) times daily as needed for cough.     famotidine 20 MG tablet  Commonly known as:  PEPCID  Take 1 tablet (20 mg total) by mouth 2 (two) times daily.     FISH OIL PO  Take 1 capsule by mouth daily.     furosemide 40 MG tablet  Commonly known as:  LASIX  Take 40 mg by mouth daily as needed for fluid or edema.     guaiFENesin 600 MG 12 hr tablet  Commonly known as:  MUCINEX  Take 2 tablets (1,200 mg total) by mouth 2 (two) times daily.     lactobacillus acidophilus & bulgar chewable tablet  Chew 1 tablet by mouth 3 (three) times daily with meals.     levofloxacin 750 MG tablet  Commonly known as:  LEVAQUIN  Take 1 tablet (750 mg total) by mouth daily.     MULTI VITAMIN DAILY PO  Take 1 tablet by mouth daily.     potassium chloride SA 20 MEQ tablet  Commonly known as:  K-DUR,KLOR-CON  Take  2 tablets (40 mEq total) by mouth daily.     pravastatin 40 MG tablet  Commonly known as:  PRAVACHOL  TAKE 1 TABLET AT BEDTIME FOR CHOLESTEROL     predniSONE 10 MG tablet  Commonly known as:  DELTASONE  Please take 4 tablets daily for 1 day, then 3 tablets daily for 2 days, then 2 tablets daily for 2 days, then 1 tablet daily for 2 days, total 16 tablets.     promethazine-dextromethorphan 6.25-15 MG/5ML syrup  Commonly known as:  PROMETHAZINE-DM  Take 5 mL as needed q8hrs  for severe coughing     vitamin C 100 MG tablet  Take 100 mg by mouth daily.     VITAMIN D PO  Take 8,000 Int'l Units by mouth daily.        Diet and Activity recommendation: See Discharge Instructions above  Major procedures and Radiology Reports - PLEASE review detailed and final reports for all details, in brief -    Dg Chest 2 View  09/08/2015  CLINICAL DATA:  Acute respiratory failure, hypoxemia EXAM: CHEST  2 VIEW COMPARISON:  09/07/2015 FINDINGS: Study is limited by patient rotation and significant levoscoliosis of lower cervical and thoracic spine. There is dextroscoliosis of the lumbar spine. No convincing pulmonary edema. Slight improvement in aeration. Streaky residual right basilar atelectasis or infiltrate. IMPRESSION: Limited study due to significant scoliosis and patient rotation. No convincing pulmonary edema. Slight improvement in aeration. Streaky residual right basilar atelectasis or infiltrate. Electronically Signed   By: Lahoma Crocker M.D.   On: 09/08/2015 11:44   Dg Chest 2 View  09/06/2015  CLINICAL DATA:  Short of breath, weakness, cough for 2 day EXAM: CHEST  2 VIEW COMPARISON:  05/20/2013 FINDINGS: There is severe deformity of the thorax and severe dextroscoliosis. Heart is enlarged. Chronic opacities in both mid and lower lung zones are likely related to volume loss. IMPRESSION: Chronic changes.  No interval change since the prior study. Electronically Signed   By: Marybelle Killings M.D.   On:  09/06/2015 15:56   Dg Chest Port 1 View  09/07/2015  CLINICAL DATA:  Acute onset of congested cough. Altered mental status. Initial encounter. EXAM: PORTABLE CHEST 1 VIEW COMPARISON:  Chest radiograph performed 09/06/2015 FINDINGS: Evaluation is significantly suboptimal due to patient rotation and the degree of left convex thoracic scoliosis. Patchy bilateral airspace opacities could reflect pneumonia or mild pulmonary edema. No pleural effusion or pneumothorax is seen. The cardiomediastinal silhouette is normal in size. No acute osseous abnormalities are identified. There is chronic bilateral  rib deformity, reflecting underlying severe thoracic scoliosis. IMPRESSION: Patchy bilateral airspace opacities could reflect pneumonia or mild pulmonary edema Electronically Signed   By: Garald Balding M.D.   On: 09/07/2015 01:22    Micro Results   Recent Results (from the past 240 hour(s))  Culture, blood (routine x 2) Call MD if unable to obtain prior to antibiotics being given     Status: None (Preliminary result)   Collection Time: 09/06/15 10:28 PM  Result Value Ref Range Status   Specimen Description BLOOD LEFT HAND  Final   Special Requests BOTTLES DRAWN AEROBIC AND ANAEROBIC 5CC  Final   Culture   Final    NO GROWTH 4 DAYS Performed at [facility]    Report Status PENDING  Incomplete  Culture, blood (routine x 2) Call MD if unable to obtain prior to antibiotics being given     Status: None (Preliminary result)   Collection Time: 09/06/15 10:34 PM  Result Value Ref Range Status   Specimen Description BLOOD LEFT ARM  Final   Special Requests BOTTLES DRAWN AEROBIC AND ANAEROBIC [place]  Final   Culture   Final    NO GROWTH 4 DAYS Performed at [facility]    Report Status PENDING  Incomplete  MRSA PCR Screening     Status: None   Collection Time: 09/07/15  9:09 AM  Result Value Ref Range Status   MRSA by PCR NEGATIVE NEGATIVE Final    Comment:        The GeneXpert MRSA Assay  (FDA approved for NASAL specimens only), is one component of a comprehensive MRSA colonization surveillance program. It is not intended to diagnose MRSA infection nor to guide or monitor treatment for MRSA infections.        Today   Subjective:   Timothy Vaughn today has no headache,no chest abdominal pain,no new weakness tingling or numbness, feels much better wants to go home today.   Objective:   Blood pressure 140/72, pulse 63, temperature 98.1 F (36.7 C), temperature source Oral, resp. rate 18, height 5\' 5"  (1.651 m), weight 62.1 kg (136 lb 14.5 oz), SpO2 91 %.   Intake/Output Summary (Last 24 hours) at 09/12/15 1114 Last data filed at 09/12/15 0756  Gross per 24 hour  Intake    300 ml  Output    375 ml  Net    -75 ml    Exam Awake Alert, Oriented x 3, No new F.N deficits, Normal affect [place].AT,PERRAL Supple Neck,No JVD, No cervical lymphadenopathy appriciated.  Symmetrical Chest wall movement, Good air movement bilaterally, CTAB RRR,No Gallops,Rubs or new Murmurs, No Parasternal Heave +ve B.Sounds, Abd Soft, Non tender, No organomegaly appriciated, No rebound -guarding or rigidity. No Cyanosis, Clubbing or edema, No new Rash or bruise  Data Review   CBC w Diff: Lab Results  Component Value Date   WBC 7.5 09/12/2015   HGB 14.5 09/12/2015   HCT 44.9 09/12/2015   PLT 155 09/12/2015   LYMPHOPCT 5 09/08/2015   MONOPCT 4 09/08/2015   EOSPCT 0 09/08/2015   BASOPCT 0 09/08/2015    CMP: Lab Results  Component Value Date   NA 142 09/12/2015   K 4.0 09/12/2015   CL 94* 09/12/2015   CO2 39* 09/12/2015   BUN 31* 09/12/2015   CREATININE 0.95 09/12/2015   CREATININE 0.69* 07/21/2015   PROT 6.2* 09/08/2015   ALBUMIN 3.3* 09/08/2015   BILITOT 0.8 09/08/2015   ALKPHOS 53 09/08/2015   AST 20 09/08/2015  ALT 23 09/08/2015  .   Total Time in preparing paper work, data evaluation and todays exam - 35 minutes  Elisse Pennick M.D on 09/12/2015 at [place]  [phone]

## 2015-09-12 NOTE — Progress Notes (Addendum)
SATURATION QUALIFICATIONS: (This note is used to comply with regulatory documentation for home oxygen)  Patient Saturations on Room Air at Rest = 90%  Patient Saturations on Room Air while Ambulating = 88%  Patient Saturations on 2 Liters of oxygen while Ambulating = 98-100%  Please briefly explain why patient needs home oxygen: desaturation when ambulating

## 2015-09-12 NOTE — Care Management Note (Signed)
Case Management Note  Patient Details  Name: Timothy Vaughn MRN: [id number] Date of Birth: 1929/04/05  Subjective/Objective:        82 yo admitted with Acute respiratory failure            Action/Plan: From home with wife  Expected Discharge Date:                  Expected Discharge Plan:  [name]  In-House Referral:     Discharge planning Services  CM Consult  Post Acute Care Choice:  Home Health Choice offered to:  Patient, Adult Children  DME Arranged:  Oxygen DME Agency:  [place] Arranged:  RN, PT, Nurse's Aide, Social Work [employer] Agency:  [place]  Status of Service:  Completed, signed off  Medicare Important Message Given:  Yes Date Medicare IM Given:    Medicare IM give by:    Date Additional Medicare IM Given:    Additional Medicare Important Message give by:     If discussed at [place] of Stay Meetings, dates discussed:    Additional Comments: This Cm met with pt and son at bedside to offer choice for [facility] services. [facility] was chosen and [facility] rep given referral. Desaturation screen done and pt qualifies for home 02. [place] DME rep contacted to give referral MD orders in for [facility] services and home 02. Pt son says he has arranged for volunteers from pt's church to stay with him some at home. Private Duty list also provided to pt son. No other CM needs communicated. Lynnell Catalan, RN 09/12/2015, 12:52 PM

## 2015-09-12 NOTE — Clinical Social Work Note (Signed)
CSW met with pt and family at bedside. Pt is being discharged today and will be going home with home health services, not SNF as previously recommended. Unit CM was alerted. CSW alerted [facility] [employer] chosen SNF] that pt will no longer need services. CSW signing off.    Cindra Presume, LCSW [phone] Hospital psychiatric & 5E, 5W 03-55 Licensed Clinical Social Worker

## 2015-09-15 DIAGNOSIS — E785 Hyperlipidemia, unspecified: Secondary | ICD-10-CM | POA: Diagnosis not present

## 2015-09-15 DIAGNOSIS — M419 Scoliosis, unspecified: Secondary | ICD-10-CM | POA: Diagnosis not present

## 2015-09-15 DIAGNOSIS — J40 Bronchitis, not specified as acute or chronic: Secondary | ICD-10-CM | POA: Diagnosis not present

## 2015-09-15 DIAGNOSIS — Z8546 Personal history of malignant neoplasm of prostate: Secondary | ICD-10-CM | POA: Diagnosis not present

## 2015-09-15 DIAGNOSIS — I10 Essential (primary) hypertension: Secondary | ICD-10-CM | POA: Diagnosis not present

## 2015-09-15 DIAGNOSIS — J45901 Unspecified asthma with (acute) exacerbation: Secondary | ICD-10-CM | POA: Diagnosis not present

## 2015-09-15 DIAGNOSIS — J984 Other disorders of lung: Secondary | ICD-10-CM | POA: Diagnosis not present

## 2015-09-15 DIAGNOSIS — R7303 Prediabetes: Secondary | ICD-10-CM | POA: Diagnosis not present

## 2015-09-19 DIAGNOSIS — I10 Essential (primary) hypertension: Secondary | ICD-10-CM | POA: Diagnosis not present

## 2015-09-19 DIAGNOSIS — J45901 Unspecified asthma with (acute) exacerbation: Secondary | ICD-10-CM | POA: Diagnosis not present

## 2015-09-19 DIAGNOSIS — J984 Other disorders of lung: Secondary | ICD-10-CM | POA: Diagnosis not present

## 2015-09-19 DIAGNOSIS — R7303 Prediabetes: Secondary | ICD-10-CM | POA: Diagnosis not present

## 2015-09-19 DIAGNOSIS — M419 Scoliosis, unspecified: Secondary | ICD-10-CM | POA: Diagnosis not present

## 2015-09-19 DIAGNOSIS — J40 Bronchitis, not specified as acute or chronic: Secondary | ICD-10-CM | POA: Diagnosis not present

## 2015-09-20 DIAGNOSIS — J40 Bronchitis, not specified as acute or chronic: Secondary | ICD-10-CM | POA: Diagnosis not present

## 2015-09-20 DIAGNOSIS — J45901 Unspecified asthma with (acute) exacerbation: Secondary | ICD-10-CM | POA: Diagnosis not present

## 2015-09-20 DIAGNOSIS — J984 Other disorders of lung: Secondary | ICD-10-CM | POA: Diagnosis not present

## 2015-09-20 DIAGNOSIS — I10 Essential (primary) hypertension: Secondary | ICD-10-CM | POA: Diagnosis not present

## 2015-09-20 DIAGNOSIS — R7303 Prediabetes: Secondary | ICD-10-CM | POA: Diagnosis not present

## 2015-09-20 DIAGNOSIS — M419 Scoliosis, unspecified: Secondary | ICD-10-CM | POA: Diagnosis not present

## 2015-09-21 ENCOUNTER — Telehealth: Payer: Self-pay | Admitting: *Deleted

## 2015-09-21 DIAGNOSIS — R7303 Prediabetes: Secondary | ICD-10-CM | POA: Diagnosis not present

## 2015-09-21 DIAGNOSIS — I10 Essential (primary) hypertension: Secondary | ICD-10-CM | POA: Diagnosis not present

## 2015-09-21 DIAGNOSIS — J984 Other disorders of lung: Secondary | ICD-10-CM | POA: Diagnosis not present

## 2015-09-21 DIAGNOSIS — M419 Scoliosis, unspecified: Secondary | ICD-10-CM | POA: Diagnosis not present

## 2015-09-21 DIAGNOSIS — J40 Bronchitis, not specified as acute or chronic: Secondary | ICD-10-CM | POA: Diagnosis not present

## 2015-09-21 DIAGNOSIS — J45901 Unspecified asthma with (acute) exacerbation: Secondary | ICD-10-CM | POA: Diagnosis not present

## 2015-09-21 NOTE — Telephone Encounter (Signed)
Timothy Vaughn called and states the patient is 98-99% on O2 at 2 lpm and can walk 240 feet without shortness of breath.  Therapist requested an order to leave off O2 to see if patient can tolerate.  Per Dr Melford Aase, it is OK to leave off the O2, if his O2 Sat stays above 90%.

## 2015-09-22 DIAGNOSIS — J40 Bronchitis, not specified as acute or chronic: Secondary | ICD-10-CM | POA: Diagnosis not present

## 2015-09-22 DIAGNOSIS — J984 Other disorders of lung: Secondary | ICD-10-CM | POA: Diagnosis not present

## 2015-09-22 DIAGNOSIS — R7303 Prediabetes: Secondary | ICD-10-CM | POA: Diagnosis not present

## 2015-09-22 DIAGNOSIS — I10 Essential (primary) hypertension: Secondary | ICD-10-CM | POA: Diagnosis not present

## 2015-09-22 DIAGNOSIS — M419 Scoliosis, unspecified: Secondary | ICD-10-CM | POA: Diagnosis not present

## 2015-09-22 DIAGNOSIS — J45901 Unspecified asthma with (acute) exacerbation: Secondary | ICD-10-CM | POA: Diagnosis not present

## 2015-09-27 ENCOUNTER — Encounter: Payer: Self-pay | Admitting: Physician Assistant

## 2015-09-27 ENCOUNTER — Ambulatory Visit: Payer: Self-pay | Admitting: Physician Assistant

## 2015-09-27 ENCOUNTER — Ambulatory Visit (INDEPENDENT_AMBULATORY_CARE_PROVIDER_SITE_OTHER): Payer: Medicare Other | Admitting: Physician Assistant

## 2015-09-27 VITALS — BP 110/60 | HR 73 | Temp 97.3°F | Resp 14 | Ht 64.0 in | Wt 142.0 lb

## 2015-09-27 DIAGNOSIS — J45901 Unspecified asthma with (acute) exacerbation: Secondary | ICD-10-CM | POA: Diagnosis not present

## 2015-09-27 DIAGNOSIS — R009 Unspecified abnormalities of heart beat: Secondary | ICD-10-CM | POA: Diagnosis not present

## 2015-09-27 DIAGNOSIS — J40 Bronchitis, not specified as acute or chronic: Secondary | ICD-10-CM | POA: Diagnosis not present

## 2015-09-27 DIAGNOSIS — J9601 Acute respiratory failure with hypoxia: Secondary | ICD-10-CM

## 2015-09-27 DIAGNOSIS — R0689 Other abnormalities of breathing: Secondary | ICD-10-CM

## 2015-09-27 DIAGNOSIS — I10 Essential (primary) hypertension: Secondary | ICD-10-CM | POA: Diagnosis not present

## 2015-09-27 DIAGNOSIS — J984 Other disorders of lung: Secondary | ICD-10-CM | POA: Diagnosis not present

## 2015-09-27 DIAGNOSIS — R7303 Prediabetes: Secondary | ICD-10-CM | POA: Diagnosis not present

## 2015-09-27 DIAGNOSIS — M419 Scoliosis, unspecified: Secondary | ICD-10-CM | POA: Diagnosis not present

## 2015-09-27 NOTE — Progress Notes (Signed)
Assessment and Plan: Abnormal pulse- get EKG which shows PAC but p waves, patient reassured- may need new pulse ox from company- no CV symptoms.  Hypoxia- RA at rest 94%, with walking 88% RA, with O2 2 L 95 % walking. Likely needs O2 at night and with walking but can stop while at rest, continue to monitor  HPI 82 y.o.male presents for follow up, at home he has a pulse ox and his pulse ox was going from 20's to 90's, he denies SOB, CP, dizziness, palpitations, his grand daughter wants to be PA and took his pulse manually and got 68. While here in the office got 60's but some regular irreg pulse, likely bigemeny/PACs but will get EKG to assure p waves.   Past Medical History  Diagnosis Date  . Hypertension   . Hyperlipidemia   . Pre-diabetes   . Vitamin D deficiency   . Cancer [facility])     prostate     Allergies  Allergen Reactions  . Lipitor [Atorvastatin] Other (See Comments)    Severe NIV  . Niaspan [Niacin Er] Itching      Current Outpatient Prescriptions on File Prior to Visit  Medication Sig Dispense Refill  . Albuterol Sulfate (PROAIR RESPICLICK) [id number] (90 BASE) MCG/ACT AEPB Inhale 2 Act into the lungs every 6 (six) hours as needed (for shortness of breath). 1 each 0  . Ascorbic Acid (VITAMIN C) 100 MG tablet Take 100 mg by mouth daily.    Marland Kitchen aspirin 81 MG tablet Take 81 mg by mouth daily.    . benzonatate (TESSALON) 200 MG capsule Take 1 capsule (200 mg total) by mouth 3 (three) times daily as needed for cough. 20 capsule 0  . Cholecalciferol (VITAMIN D PO) Take 8,000 Int'l Units by mouth daily.     . famotidine (PEPCID) 20 MG tablet Take 1 tablet (20 mg total) by mouth 2 (two) times daily. 10 tablet 0  . furosemide (LASIX) 40 MG tablet Take 40 mg by mouth daily as needed for fluid or edema.     Marland Kitchen guaiFENesin (MUCINEX) 600 MG 12 hr tablet Take 2 tablets (1,200 mg total) by mouth 2 (two) times daily. 8 tablet 0  . lactobacillus acidophilus & bulgar (LACTINEX) chewable tablet Chew 1  tablet by mouth 3 (three) times daily with meals. 15 tablet 0  . levofloxacin (LEVAQUIN) 750 MG tablet Take 1 tablet (750 mg total) by mouth daily. 3 tablet 0  . Multiple Vitamin (MULTI VITAMIN DAILY PO) Take 1 tablet by mouth daily.     . Omega-3 Fatty Acids (FISH OIL PO) Take 1 capsule by mouth daily.     . potassium chloride SA (K-DUR,KLOR-CON) 20 MEQ tablet Take 2 tablets (40 mEq total) by mouth daily. 60 tablet 0  . pravastatin (PRAVACHOL) 40 MG tablet TAKE 1 TABLET AT BEDTIME FOR CHOLESTEROL 90 tablet 1  . predniSONE (DELTASONE) 10 MG tablet Please take 4 tablets daily for 1 day, then 3 tablets daily for 2 days, then 2 tablets daily for 2 days, then 1 tablet daily for 2 days, total 16 tablets. 16 tablet 0  . promethazine-dextromethorphan (PROMETHAZINE-DM) 6.25-15 MG/5ML syrup Take 5 mL as needed q8hrs  for severe coughing 360 mL 1   No current facility-administered medications on file prior to visit.    ROS: all negative except above.   Physical Exam: Filed Weights   09/27/15 1619  Weight: 142 lb (64.411 kg)   BP 110/60 mmHg  Pulse 73  Temp(Src) 97.3  F (36.3 C) (Temporal)  Resp 14  Ht 5\' 4"  (1.626 m)  Wt 142 lb (64.411 kg)  BMI 24.36 kg/m2  SpO2 99% General Appearance: Well nourished, in no apparent distress. Eyes: PERRLA, EOMs, conjunctiva no swelling or erythema Sinuses: No Frontal/maxillary tenderness ENT/Mouth: Ext aud canals clear, TMs without erythema, bulging. No erythema, swelling, or exudate on post pharynx.  Tonsils not swollen or erythematous. Hearing normal.  Neck: Supple, thyroid normal.  Respiratory: Respiratory effort normal, distant lung sounds with mild expiratory wheeze due to kyphoscoliosis.  Cardio: RRR, bigeminy/extra beats, with no MRGs. Brisk peripheral pulses without edema.  Abdomen: Soft, + BS,  Non tender, no guarding, rebound, hernias, masses. Lymphatics: Non tender without lymphadenopathy.  Musculoskeletal: Severe kyphoscoliosis, 4/5 strength,  Normal gait Skin: Warm, dry without rashes, lesions, ecchymosis.  Neuro: Cranial nerves intact. Normal muscle tone, no cerebellar symptoms. Psych: Awake and oriented X 3, normal affect, Insight and Judgment appropriate.  Vicie Mutters, PA-C 4:39 PM [facility]

## 2015-09-28 DIAGNOSIS — R7303 Prediabetes: Secondary | ICD-10-CM | POA: Diagnosis not present

## 2015-09-28 DIAGNOSIS — J40 Bronchitis, not specified as acute or chronic: Secondary | ICD-10-CM | POA: Diagnosis not present

## 2015-09-28 DIAGNOSIS — I10 Essential (primary) hypertension: Secondary | ICD-10-CM | POA: Diagnosis not present

## 2015-09-28 DIAGNOSIS — J45901 Unspecified asthma with (acute) exacerbation: Secondary | ICD-10-CM | POA: Diagnosis not present

## 2015-09-28 DIAGNOSIS — J984 Other disorders of lung: Secondary | ICD-10-CM | POA: Diagnosis not present

## 2015-09-28 DIAGNOSIS — M419 Scoliosis, unspecified: Secondary | ICD-10-CM | POA: Diagnosis not present

## 2015-09-29 DIAGNOSIS — J984 Other disorders of lung: Secondary | ICD-10-CM | POA: Diagnosis not present

## 2015-09-29 DIAGNOSIS — I10 Essential (primary) hypertension: Secondary | ICD-10-CM | POA: Diagnosis not present

## 2015-09-29 DIAGNOSIS — J45901 Unspecified asthma with (acute) exacerbation: Secondary | ICD-10-CM | POA: Diagnosis not present

## 2015-09-29 DIAGNOSIS — R7303 Prediabetes: Secondary | ICD-10-CM | POA: Diagnosis not present

## 2015-09-29 DIAGNOSIS — M419 Scoliosis, unspecified: Secondary | ICD-10-CM | POA: Diagnosis not present

## 2015-09-29 DIAGNOSIS — J40 Bronchitis, not specified as acute or chronic: Secondary | ICD-10-CM | POA: Diagnosis not present

## 2015-09-30 ENCOUNTER — Ambulatory Visit (INDEPENDENT_AMBULATORY_CARE_PROVIDER_SITE_OTHER): Payer: Medicare Other | Admitting: Adult Health

## 2015-09-30 ENCOUNTER — Encounter: Payer: Self-pay | Admitting: Adult Health

## 2015-09-30 VITALS — BP 106/66 | HR 61 | Temp 98.2°F | Ht 64.0 in | Wt 142.0 lb

## 2015-09-30 DIAGNOSIS — R0902 Hypoxemia: Secondary | ICD-10-CM

## 2015-09-30 DIAGNOSIS — J984 Other disorders of lung: Secondary | ICD-10-CM

## 2015-09-30 DIAGNOSIS — R0602 Shortness of breath: Secondary | ICD-10-CM | POA: Diagnosis not present

## 2015-09-30 DIAGNOSIS — J9602 Acute respiratory failure with hypercapnia: Secondary | ICD-10-CM

## 2015-09-30 DIAGNOSIS — M419 Scoliosis, unspecified: Secondary | ICD-10-CM

## 2015-09-30 NOTE — Assessment & Plan Note (Signed)
Severe restrictive lung disease d/t kyphoscoliosis  Discussed hypercarbia - recommended sleep study  May be able to get BIPAP alone with chronically elevated CO levels  However pt declines at this time.

## 2015-09-30 NOTE — Progress Notes (Signed)
Subjective:    Patient ID: Timothy Vaughn, male    DOB: 17-May-1929, 82 y.o.   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    09/30/2015 Post hospital follow up  Pt returns for a post hospital follow up .  Recent admission this month for acute hypoxic and hypercarbic resp failure due to severe  Kyphoscoliosis w/ RAD . Appeared he may have had viral syndrome . Echo shoed nml EF, with moderate TR . He was treated with BIPAP , abx, steroids, diuresis . Recommendations to evaluate for possible sleep apnea . He was started on O2 At bedtime . Suspected to have some issues with ventilation as he has severe kyphoscoliosis causing significant restrictive lung dz.. He had severe hypercarbia with PCO2 >100.  We discussed sleep study , he declines at this time, discussed elevated PCO2 levels and probable need for BIPAP At bedtime  -he says he can wear that mask. Feels he is much better using O2.  Pt states breathing has improved and has no new complaints at this time.  Denies chest pain, orthopnea, edema or fever.  Spirometry today showed severe restriction with FVC 28%, FEV1 24%, ratio 65.  On O2 at 2l/m act and At bedtime      Past Medical History  Diagnosis Date  . Hypertension   . Hyperlipidemia   . Pre-diabetes   . Vitamin D deficiency   . Cancer [facility])     prostate   Current Outpatient Prescriptions on File Prior to Visit  Medication Sig Dispense Refill  . Albuterol Sulfate (PROAIR RESPICLICK) [id number] (90 BASE) MCG/ACT AEPB Inhale 2 Act into the lungs every 6 (six) hours as needed (for shortness of breath). 1 each 0  . Ascorbic Acid (VITAMIN C) 100 MG tablet Take 100 mg by mouth daily.    Marland Kitchen aspirin 81 MG tablet Take 81 mg by mouth daily.    . benzonatate (TESSALON) 200 MG capsule Take 1 capsule (200 mg total) by mouth 3  (three) times daily as needed for cough. 20 capsule 0  . Cholecalciferol (VITAMIN D PO) Take 8,000 Int'l Units by mouth daily.     . famotidine (PEPCID) 20 MG tablet Take 1 tablet (20 mg total) by mouth 2 (two) times daily. 10 tablet 0  . furosemide (LASIX) 40 MG tablet Take 40 mg by mouth daily as needed for fluid or edema.     Marland Kitchen guaiFENesin (MUCINEX) 600 MG 12 hr tablet Take 2 tablets (1,200 mg total) by mouth 2 (two) times daily. 8 tablet 0  . lactobacillus acidophilus & bulgar (LACTINEX) chewable tablet Chew 1 tablet by mouth 3 (three) times daily with meals. 15 tablet 0  . Multiple Vitamin (MULTI VITAMIN DAILY PO) Take 1 tablet by mouth daily.     . Omega-3 Fatty Acids (FISH OIL PO) Take 1 capsule by mouth daily.     . potassium chloride SA (K-DUR,KLOR-CON) 20 MEQ tablet Take 2 tablets (40 mEq total) by mouth daily. 60 tablet 0  . pravastatin (PRAVACHOL) 40 MG tablet TAKE 1 TABLET AT BEDTIME FOR CHOLESTEROL 90 tablet 1  . promethazine-dextromethorphan (PROMETHAZINE-DM) 6.25-15 MG/5ML syrup Take 5 mL as needed q8hrs  for severe coughing 360 mL 1   No current facility-administered medications on file prior to visit.      Review of Systems Constitutional:  No  weight loss, night sweats,  Fevers, chills, fatigue, or  lassitude.  HEENT:   No headaches,  Difficulty swallowing,  Tooth/dental problems, or  Sore throat,                No sneezing, itching, ear ache, nasal congestion, post nasal drip,   CV:  No chest pain,  Orthopnea, PND,  , anasarca, dizziness, palpitations, syncope.   GI  No heartburn, indigestion, abdominal pain, nausea, vomiting, diarrhea, change in bowel habits, loss of appetite, bloody stools.   Resp:    No excess mucus, no productive cough,  No non-productive cough,  No coughing up of blood.  No change in color of mucus.  No wheezing.  No chest wall deformity  Skin: no rash or lesions.  GU: no dysuria, change in color of urine, no urgency or frequency.  No flank  pain, no hematuria   MS:  No joint pain or swelling.  No decreased range of motion.  No back pain.  Psych:  No change in mood or affect. No depression or anxiety.  No memory loss.         Objective:   Physical Exam Filed Vitals:   09/30/15 0934  BP: 106/66  Pulse: 61  Temp: 98.2 F (36.8 C)  TempSrc: Oral  Height: 5\' 4"  (1.626 m)  Weight: 142 lb (64.411 kg)  SpO2: 99%   GEN: A/Ox3; pleasant , NAD, elderly and frail on O2   HEENT:  [place]/AT,  EACs-clear, TMs-wnl, NOSE-clear, THROAT-clear, no lesions, no postnasal drip or exudate noted.   NECK:  Supple w/ fair ROM; no JVD; normal carotid impulses w/o bruits; no thyromegaly or nodules palpated; no lymphadenopathy.  RESP  Decreased BS in bases no accessory muscle use, no dullness to percussion Severe kyphoscoliosis   CARD:  RRR, no m/r/g  , 1+ peripheral edema, pulses intact, no cyanosis or clubbing.  GI:   Soft & nt; nml bowel sounds; no organomegaly or masses detected.  Musco: Warm bil, no deformities or joint swelling noted.   Neuro: alert, no focal deficits noted.    Skin: Warm, no lesions or rashes         Assessment & Plan:

## 2015-09-30 NOTE — Patient Instructions (Signed)
Continue on Oxygen 2l/m with activity and At bedtime  .  Order to home care for evaluation for portable oxygen concentrator.  Call back if you change your mind regarding sleep study   Follow up with Dr. Lake Bells in 3 months and As needed

## 2015-09-30 NOTE — Assessment & Plan Note (Signed)
Improved after treatment  Ongoing tx with O2 with act and At bedtime   eval for POC sent to DME

## 2015-10-03 ENCOUNTER — Ambulatory Visit: Payer: Self-pay | Admitting: Internal Medicine

## 2015-10-03 NOTE — Progress Notes (Signed)
Reviewed, Agree with this plan of care

## 2015-10-04 DIAGNOSIS — J40 Bronchitis, not specified as acute or chronic: Secondary | ICD-10-CM | POA: Diagnosis not present

## 2015-10-04 DIAGNOSIS — R7303 Prediabetes: Secondary | ICD-10-CM | POA: Diagnosis not present

## 2015-10-04 DIAGNOSIS — J45901 Unspecified asthma with (acute) exacerbation: Secondary | ICD-10-CM | POA: Diagnosis not present

## 2015-10-04 DIAGNOSIS — J984 Other disorders of lung: Secondary | ICD-10-CM | POA: Diagnosis not present

## 2015-10-04 DIAGNOSIS — I10 Essential (primary) hypertension: Secondary | ICD-10-CM | POA: Diagnosis not present

## 2015-10-04 DIAGNOSIS — M419 Scoliosis, unspecified: Secondary | ICD-10-CM | POA: Diagnosis not present

## 2015-10-06 DIAGNOSIS — J984 Other disorders of lung: Secondary | ICD-10-CM | POA: Diagnosis not present

## 2015-10-06 DIAGNOSIS — R7303 Prediabetes: Secondary | ICD-10-CM | POA: Diagnosis not present

## 2015-10-06 DIAGNOSIS — J45901 Unspecified asthma with (acute) exacerbation: Secondary | ICD-10-CM | POA: Diagnosis not present

## 2015-10-06 DIAGNOSIS — M419 Scoliosis, unspecified: Secondary | ICD-10-CM | POA: Diagnosis not present

## 2015-10-06 DIAGNOSIS — I10 Essential (primary) hypertension: Secondary | ICD-10-CM | POA: Diagnosis not present

## 2015-10-06 DIAGNOSIS — J40 Bronchitis, not specified as acute or chronic: Secondary | ICD-10-CM | POA: Diagnosis not present

## 2015-10-07 DIAGNOSIS — I10 Essential (primary) hypertension: Secondary | ICD-10-CM | POA: Diagnosis not present

## 2015-10-07 DIAGNOSIS — R7303 Prediabetes: Secondary | ICD-10-CM | POA: Diagnosis not present

## 2015-10-07 DIAGNOSIS — M419 Scoliosis, unspecified: Secondary | ICD-10-CM | POA: Diagnosis not present

## 2015-10-07 DIAGNOSIS — J45901 Unspecified asthma with (acute) exacerbation: Secondary | ICD-10-CM | POA: Diagnosis not present

## 2015-10-07 DIAGNOSIS — J40 Bronchitis, not specified as acute or chronic: Secondary | ICD-10-CM | POA: Diagnosis not present

## 2015-10-07 DIAGNOSIS — J984 Other disorders of lung: Secondary | ICD-10-CM | POA: Diagnosis not present

## 2015-10-08 NOTE — Addendum Note (Signed)
Addended by: Lasondra Hodgkins A on: 10/08/2015 12:51 PM   Modules accepted: Orders

## 2015-10-14 DIAGNOSIS — R7303 Prediabetes: Secondary | ICD-10-CM | POA: Diagnosis not present

## 2015-10-14 DIAGNOSIS — J45901 Unspecified asthma with (acute) exacerbation: Secondary | ICD-10-CM | POA: Diagnosis not present

## 2015-10-14 DIAGNOSIS — I10 Essential (primary) hypertension: Secondary | ICD-10-CM | POA: Diagnosis not present

## 2015-10-14 DIAGNOSIS — M419 Scoliosis, unspecified: Secondary | ICD-10-CM | POA: Diagnosis not present

## 2015-10-14 DIAGNOSIS — J40 Bronchitis, not specified as acute or chronic: Secondary | ICD-10-CM | POA: Diagnosis not present

## 2015-10-14 DIAGNOSIS — J984 Other disorders of lung: Secondary | ICD-10-CM | POA: Diagnosis not present

## 2015-10-19 DIAGNOSIS — I10 Essential (primary) hypertension: Secondary | ICD-10-CM | POA: Diagnosis not present

## 2015-10-19 DIAGNOSIS — J40 Bronchitis, not specified as acute or chronic: Secondary | ICD-10-CM | POA: Diagnosis not present

## 2015-10-19 DIAGNOSIS — J45901 Unspecified asthma with (acute) exacerbation: Secondary | ICD-10-CM | POA: Diagnosis not present

## 2015-10-19 DIAGNOSIS — R7303 Prediabetes: Secondary | ICD-10-CM | POA: Diagnosis not present

## 2015-10-19 DIAGNOSIS — J984 Other disorders of lung: Secondary | ICD-10-CM | POA: Diagnosis not present

## 2015-10-19 DIAGNOSIS — M419 Scoliosis, unspecified: Secondary | ICD-10-CM | POA: Diagnosis not present

## 2015-10-21 ENCOUNTER — Ambulatory Visit (INDEPENDENT_AMBULATORY_CARE_PROVIDER_SITE_OTHER): Payer: Medicare Other | Admitting: Physician Assistant

## 2015-10-21 ENCOUNTER — Encounter: Payer: Self-pay | Admitting: Internal Medicine

## 2015-10-21 ENCOUNTER — Other Ambulatory Visit: Payer: Self-pay

## 2015-10-21 ENCOUNTER — Encounter: Payer: Self-pay | Admitting: Physician Assistant

## 2015-10-21 VITALS — BP 124/70 | HR 59 | Temp 97.7°F | Resp 14 | Ht 64.0 in | Wt 144.0 lb

## 2015-10-21 DIAGNOSIS — Z0001 Encounter for general adult medical examination with abnormal findings: Secondary | ICD-10-CM

## 2015-10-21 DIAGNOSIS — I071 Rheumatic tricuspid insufficiency: Secondary | ICD-10-CM

## 2015-10-21 DIAGNOSIS — N182 Chronic kidney disease, stage 2 (mild): Secondary | ICD-10-CM | POA: Diagnosis not present

## 2015-10-21 DIAGNOSIS — R6889 Other general symptoms and signs: Secondary | ICD-10-CM

## 2015-10-21 DIAGNOSIS — E782 Mixed hyperlipidemia: Secondary | ICD-10-CM | POA: Diagnosis not present

## 2015-10-21 DIAGNOSIS — M419 Scoliosis, unspecified: Secondary | ICD-10-CM

## 2015-10-21 DIAGNOSIS — D7589 Other specified diseases of blood and blood-forming organs: Secondary | ICD-10-CM | POA: Diagnosis not present

## 2015-10-21 DIAGNOSIS — J9602 Acute respiratory failure with hypercapnia: Secondary | ICD-10-CM | POA: Diagnosis not present

## 2015-10-21 DIAGNOSIS — J984 Other disorders of lung: Secondary | ICD-10-CM

## 2015-10-21 DIAGNOSIS — Z79899 Other long term (current) drug therapy: Secondary | ICD-10-CM

## 2015-10-21 DIAGNOSIS — I10 Essential (primary) hypertension: Secondary | ICD-10-CM

## 2015-10-21 DIAGNOSIS — E1129 Type 2 diabetes mellitus with other diabetic kidney complication: Secondary | ICD-10-CM | POA: Diagnosis not present

## 2015-10-21 DIAGNOSIS — Z23 Encounter for immunization: Secondary | ICD-10-CM

## 2015-10-21 DIAGNOSIS — M4120 Other idiopathic scoliosis, site unspecified: Secondary | ICD-10-CM

## 2015-10-21 DIAGNOSIS — E559 Vitamin D deficiency, unspecified: Secondary | ICD-10-CM

## 2015-10-21 DIAGNOSIS — E1122 Type 2 diabetes mellitus with diabetic chronic kidney disease: Secondary | ICD-10-CM | POA: Diagnosis not present

## 2015-10-21 DIAGNOSIS — Z0000 Encounter for general adult medical examination without abnormal findings: Secondary | ICD-10-CM

## 2015-10-21 LAB — CBC WITH DIFFERENTIAL/PLATELET
BASOS ABS: 0 10*3/uL (ref 0.0–0.1)
BASOS PCT: 0 % (ref 0–1)
EOS ABS: 0.1 10*3/uL (ref 0.0–0.7)
EOS PCT: 1 % (ref 0–5)
HCT: 38.5 % — ABNORMAL LOW (ref 39.0–52.0)
Hemoglobin: 12.8 g/dL — ABNORMAL LOW (ref 13.0–17.0)
LYMPHS PCT: 30 % (ref 12–46)
Lymphs Abs: 1.9 10*3/uL (ref 0.7–4.0)
MCH: 32.2 pg (ref 26.0–34.0)
MCHC: 33.2 g/dL (ref 30.0–36.0)
MCV: 97 fL (ref 78.0–100.0)
MONO ABS: 0.8 10*3/uL (ref 0.1–1.0)
MPV: 10 fL (ref 8.6–12.4)
Monocytes Relative: 13 % — ABNORMAL HIGH (ref 3–12)
Neutro Abs: 3.6 10*3/uL (ref 1.7–7.7)
Neutrophils Relative %: 56 % (ref 43–77)
PLATELETS: 189 10*3/uL (ref 150–400)
RBC: 3.97 MIL/uL — AB (ref 4.22–5.81)
RDW: 13.7 % (ref 11.5–15.5)
WBC: 6.4 10*3/uL (ref 4.0–10.5)

## 2015-10-21 LAB — BASIC METABOLIC PANEL WITH GFR
BUN: 19 mg/dL (ref 7–25)
CALCIUM: 9.3 mg/dL (ref 8.6–10.3)
CO2: 39 mmol/L — ABNORMAL HIGH (ref 20–31)
Chloride: 97 mmol/L — ABNORMAL LOW (ref 98–110)
Creat: 0.7 mg/dL (ref 0.70–1.11)
GFR, EST NON AFRICAN AMERICAN: 87 mL/min (ref >=60)
GLUCOSE: 76 mg/dL (ref 65–99)
POTASSIUM: 4.6 mmol/L (ref 3.5–5.3)
SODIUM: 140 mmol/L (ref 135–146)

## 2015-10-21 LAB — HEMOGLOBIN A1C
Hgb A1c MFr Bld: 6.2 % — ABNORMAL HIGH (ref <5.7)
Mean Plasma Glucose: 131 mg/dL — ABNORMAL HIGH (ref <117)

## 2015-10-21 LAB — HEPATIC FUNCTION PANEL
ALBUMIN: 3.9 g/dL (ref 3.6–5.1)
ALK PHOS: 61 U/L (ref 40–115)
ALT: 15 U/L (ref 9–46)
AST: 25 U/L (ref 10–35)
BILIRUBIN INDIRECT: 0.5 mg/dL (ref 0.2–1.2)
Bilirubin, Direct: 0.1 mg/dL (ref <=0.2)
TOTAL PROTEIN: 6.2 g/dL (ref 6.1–8.1)
Total Bilirubin: 0.6 mg/dL (ref 0.2–1.2)

## 2015-10-21 LAB — TSH: TSH: 0.84 m[IU]/L (ref 0.40–4.50)

## 2015-10-21 LAB — LIPID PANEL
CHOL/HDL RATIO: 3.1 ratio (ref <=5.0)
CHOLESTEROL: 181 mg/dL (ref 125–200)
HDL: 58 mg/dL (ref >=40)
LDL Cholesterol: 101 mg/dL (ref <130)
TRIGLYCERIDES: 108 mg/dL (ref <150)
VLDL: 22 mg/dL (ref <30)

## 2015-10-21 LAB — MAGNESIUM: MAGNESIUM: 1.9 mg/dL (ref 1.5–2.5)

## 2015-10-21 NOTE — Patient Instructions (Signed)
Diabetes is a very complicated disease...lets simplify it.  An easy way to look at it to understand the complications is if you think of the extra sugar floating in your blood stream as glass shards floating through your blood stream.    Diabetes affects your small vessels first: 1) The glass shards (sugar) scraps down the tiny blood vessels in your eyes and lead to diabetic retinopathy, the leading cause of blindness in the [place]. Diabetes is the leading cause of newly diagnosed adult (20 to 80 years of age) blindness in the [place].  2) The glass shards scratches down the tiny vessels of your legs leading to nerve damage called neuropathy and can lead to amputations of your feet. More than 60% of all non-traumatic amputations of lower limbs occur in people with diabetes.  3) Over time the small vessels in your brain are shredded and closed off, individually this does not cause any problems but over a long period of time many of the small vessels being blocked can lead to Vascular Dementia.   4) Your kidney's are a filter system and have a "net" that keeps certain things in the body and lets bad things out. Sugar shreds this net and leads to kidney damage and eventually failure. Decreasing the sugar that is destroying the net and certain blood pressure medications can help stop or decrease progression of kidney disease. Diabetes was the primary cause of kidney failure in 44 percent of all new cases in 2011.  5) Diabetes also destroys the small vessels in your penis that lead to erectile dysfunction. Eventually the vessels are so damaged that you may not be responsive to cialis or viagra.   Diabetes and your large vessels: Your larger vessels consist of your coronary arteries in your heart and the carotid vessels to your brain. Diabetes or even increased sugars put you at 300% increased risk of heart attack and stroke and this is why.. The sugar scrapes down your large blood vessels and your body  sees this as an internal injury and tries to repair itself. Just like you get a scab on your skin, your platelets will stick to the blood vessel wall trying to heal it. This is why we have diabetics on low dose aspirin daily, this prevents the platelets from sticking and can prevent plaque formation. In addition, your body takes cholesterol and tries to shove it into the open wound. This is why we want your LDL, or bad cholesterol, below 70.   The combination of platelets and cholesterol over 5-10 years forms plaque that can break off and cause a heart attack or stroke.   PLEASE REMEMBER:  Diabetes is preventable! Up to 85 percent of complications and morbidities among individuals with type 2 diabetes can be prevented, delayed, or effectively treated and minimized with regular visits to a health professional, appropriate monitoring and medication, and a healthy diet and lifestyle.     Bad carbs also include fruit juice, alcohol, and sweet tea. These are empty calories that do not signal to your brain that you are full.   Please remember the good carbs are still carbs which convert into sugar. So please measure them out no more than 1/2-1 cup of rice, oatmeal, pasta, and beans  Veggies are however free foods! Pile them on.   Not all fruit is created equal. Please see the list below, the fruit at the bottom is higher in sugars than the fruit at the top. Please avoid all dried fruits.     

## 2015-10-21 NOTE — Progress Notes (Signed)
Patient ID: Timothy Vaughn, male   DOB: [birth year], 82 y.o.   MRN: [id number]  MEDICARE ANNUAL WELLNESS VISIT AND Quarterly OV  Assessment:   1. Essential hypertension - CBC with Differential/Platelet - BASIC METABOLIC PANEL WITH GFR - TSH - Hepatic function panel  2. Hyperlipidemia - Lipid panel  3. Macrocytosis without anemia Check CBC  4. Kyphoscoliosis monitor  5. CKD stage 2 due to type 2 diabetes mellitus ([place]) Discussed general issues about diabetes pathophysiology and management., Educational material distributed., Suggested low cholesterol diet., Encouraged aerobic exercise., Discussed foot care., Reminded to get yearly retinal exam. - BASIC METABOLIC PANEL WITH GFR - Hemoglobin A1c  6. Acute hypercapnic respiratory failure (HCC) Oxygen good at RA and good with walking, can discontinue O2 Getting sleep study in April, strongly suggested to the patient to get BiPAP if possible  7. Restrictive lung disease due to kyphoscoliosis Continue pulmonary follow up  8. Vitamin D deficiency Continue supplement  9. Medication management - Magnesium  10. Medicare annual wellness visit, initial Will not get colonoscopy due to age  31. Moderate tricuspid regurgitation  12. Need for prophylactic vaccination against Streptococcus pneumoniae (pneumococcus) Prevnar today, Tetanus next OV   Future Appointments Date Time Provider [place]  12/29/2015 9:30 AM Juanito Doom, MD [facility] None  05/03/2016 10:00 AM Unk Pinto, MD [facility] None     Plan:   During the course of the visit the patient was educated and counseled about appropriate screening and preventive services including:    Pneumococcal vaccine   Influenza vaccine  Td vaccine  Screening electrocardiogram  Bone densitometry screening  Colorectal cancer screening  Diabetes screening  Glaucoma screening  Nutrition counseling   Advanced directives:  requested   Conditions/risks identified: BMI: Discussed weight loss, diet, and increase physical activity.  Increase physical activity: AHA recommends 150 minutes of physical activity a week.  Medications reviewed PreDiabetes is at goal, ACE/ARB therapy: No, Reason not on Ace Inhibitor/ARB therapy:  not indicated with pre Diabetes Urinary Incontinence is not an issue: discussed non pharmacology and pharmacology options.  Fall risk: low- discussed PT, home fall assessment, medications.   Subjective:  Timothy Vaughn is a 82 y.o. male who presents for Medicare Annual Wellness Visit and 6 month OV follow up.  Date of last medicare wellness visit was 05/2014  He has had elevated blood pressure since 2004. His blood pressure has been controlled at home, today their BP is BP: 124/70 mmHg He does workout. He denies chest pain, dizziness.  He has severe kypohoscolosis with RAD, following pulmonary, normal echo with moderate TR, started on O2 during hospital visit in Jan for hypoxia and hypercarbic resp failure, he was started on O2 at that time and states he is doing well off of it, he has not been using it when he goes out, O2 at 98% RA, with walking will go to 92-93%.   He is on cholesterol medication and denies myalgias. His cholesterol is neart  goal. The cholesterol last visit was:   Lab Results  Component Value Date   CHOL 157 07/21/2015   HDL 55 07/21/2015   LDLCALC 88 07/21/2015   TRIG 68 07/21/2015   CHOLHDL 2.9 07/21/2015   He has diabetes x 1 year.  He has been working on diet for DM, he is on bASA, he is not on ACE/ARB due to hypotension and denies foot ulcerations, hyperglycemia, hypoglycemia , increased appetite, nausea, paresthesia of the feet, polydipsia, polyuria and visual disturbances. Last  A1C in the office was:  Lab Results  Component Value Date   HGBA1C 6.7* 09/09/2015   Lab Results  Component Value Date   GFRNONAA >60 09/12/2015   Patient is on Vitamin D  supplement.   Lab Results  Component Value Date   VD25OH 59 04/12/2015     Names of Other Physician/Practitioners you currently use: 1. [facility] here for primary care 2. DrHeather Omen, eye doctor, last visit Dec 2014 3. Dr Yong Channel, dentist, last visit July 2015 every 3-4 months  Patient Care Team: Unk Pinto, MD as PCP - General (Internal Medicine) Inda Castle, MD as Consulting Physician (Gastroenterology) Carolan Clines, MD as Consulting Physician (Urology)   Medication Review:   Medication List       This list is accurate as of: 10/21/15 11:16 AM.  Always use your most recent med list.               Albuterol Sulfate 108 (90 Base) MCG/ACT Aepb  Commonly known as:  PROAIR RESPICLICK  Inhale 2 Act into the lungs every 6 (six) hours as needed (for shortness of breath).     aspirin 81 MG tablet  Take 81 mg by mouth daily.     famotidine 20 MG tablet  Commonly known as:  PEPCID  Take 1 tablet (20 mg total) by mouth 2 (two) times daily.     FISH OIL PO  Take 1 capsule by mouth daily.     furosemide 40 MG tablet  Commonly known as:  LASIX  Take 40 mg by mouth daily as needed for fluid or edema.     lactobacillus acidophilus & bulgar chewable tablet  Chew 1 tablet by mouth 3 (three) times daily with meals.     MULTI VITAMIN DAILY PO  Take 1 tablet by mouth daily.     potassium chloride SA 20 MEQ tablet  Commonly known as:  K-DUR,KLOR-CON  Take 2 tablets (40 mEq total) by mouth daily.     pravastatin 40 MG tablet  Commonly known as:  PRAVACHOL  TAKE 1 TABLET AT BEDTIME FOR CHOLESTEROL     vitamin C 100 MG tablet  Take 100 mg by mouth daily.     VITAMIN D PO  Take 8,000 Int'l Units by mouth daily.       Current Problems (verified) Patient Active Problem List   Diagnosis Date Noted  . Moderate tricuspid regurgitation 09/08/2015  . Macrocytosis without anemia 09/08/2015  . Acute hypercapnic respiratory  failure ([place]) 09/07/2015  . Pressure ulcer 09/07/2015  . Medicare annual wellness visit, initial 04/12/2015  . Medication management 02/10/2014  . Essential hypertension 08/14/2013  . Hyperlipidemia 08/14/2013  . CKD stage 2 due to type 2 diabetes mellitus ([place]) 08/14/2013  . Vitamin D deficiency 08/14/2013  . Kyphoscoliosis 08/14/2013  . Restrictive lung disease due to kyphoscoliosis 08/14/2013   Screening Tests Health Maintenance  Topic Date Due  . FOOT EXAM  08/28/1941  . OPHTHALMOLOGY EXAM  08/28/1941  . TETANUS/TDAP  08/28/1950  . PNA vac Low Risk Adult (2 of 2 - PCV13) 02/04/2013  . HEMOGLOBIN A1C  03/08/2016  . INFLUENZA VACCINE  04/03/2016  . URINE MICROALBUMIN  04/11/2016  . ZOSTAVAX  Completed   Immunization History  Administered Date(s) Administered  . DTaP 09/03/2005  . Influenza Whole 05/20/2013  . Influenza, High Dose Seasonal PF 06/04/2014, 06/09/2015  . Pneumococcal Polysaccharide-23 02/05/2012  . Zoster 09/25/2005   Preventative care: Last colonoscopy: 2007  declines due to age Echo 2017  Prior vaccinations: Tdap: 09/2005  Influenza: 05/2015 Pneumococcal: 02/2012 prevnar 13: DUE Shingles/Zostavax: 09/2005  History reviewed: allergies, current medications, past family history, past medical history, past social history, past surgical history and problem list  Risk Factors: Tobacco Social History  Substance Use Topics  . Smoking status: Never Smoker   . Smokeless tobacco: None  . Alcohol Use: No   MEDICARE WELLNESS OBJECTIVES: Tobacco use: He does not smoke.  Patient is not a former smoker. Alcohol Current alcohol use: none Diet: in general, a "healthy" diet   Physical activity: Current Exercise Habits:: Exercise is limited by, Limited by:: respiratory conditions(s) Cardiac risk factors: Cardiac Risk Factors include: advanced age (>89men, >71 women);diabetes mellitus;dyslipidemia;hypertension;male gender;sedentary lifestyle;family history of premature  cardiovascular disease Depression/mood screen:   Depression screen [facility] 2/9 10/21/2015  Decreased Interest 0  Down, Depressed, Hopeless 0  PHQ - 2 Score 0    ADLs:  In your present state of health, do you have any difficulty performing the following activities: 10/21/2015 09/06/2015  Hearing? N N  Vision? N N  Difficulty concentrating or making decisions? N N  Walking or climbing stairs? Y N  Dressing or bathing? N N  Doing errands, shopping? N N  Preparing Food and eating ? N -  Using the Toilet? N -  In the past six months, have you accidently leaked urine? N -  Do you have problems with loss of bowel control? N -  Managing your Medications? N -  Managing your Finances? N -  Housekeeping or managing your Housekeeping? N -     Cognitive Testing  Alert? Yes  Normal Appearance?Yes  Oriented to person? Yes  Place? Yes   Time? Yes  Recall of three objects?  Yes  Can perform simple calculations? Yes  Displays appropriate judgment?Yes  Can read the correct time from a watch face?Yes  EOL planning: Does patient have an advance directive?: No Would patient like information on creating an advanced directive?: Yes - Educational materials given   Objective:     Blood pressure 124/70, pulse 59, temperature 97.7 F (36.5 C), temperature source Temporal, resp. rate 14, height 5\' 4"  (1.626 m), weight 144 lb (65.318 kg), SpO2 99 %. Body mass index is 24.71 kg/(m^2).  General appearance: alert, no distress, WD/WN, male Cognitive Testing  Alert? Yes  Normal Appearance?Yes  Oriented to person? Yes  Place? Yes   Time? Yes  Recall of three objects?  Yes  Can perform simple calculations? Yes  Displays appropriate judgment?Yes  Can read the correct time from a watch face?Yes  Eyes: PERRLA, EOMs, conjunctiva no swelling or erythema Sinuses: No Frontal/maxillary tenderness ENT/Mouth: Ext aud canals clear, TMs without erythema, bulging. No erythema, swelling, or exudate on post pharynx.   Tonsils not swollen or erythematous. Hearing normal.  Neck: Supple, thyroid normal.  Respiratory: Respiratory effort normal, distant lung sounds due to kyphoscoliosis but no wheezing, rales, rhonchi.  Cardio: RRR, bigeminy/extra beats, with no MRGs. Brisk peripheral pulses without edema.  Abdomen: Soft, + BS,  Non tender, no guarding, rebound, hernias, masses. Lymphatics: Non tender without lymphadenopathy.  Musculoskeletal: Severe kyphoscoliosis, 4/5 strength, Normal gait Skin: Warm, dry without rashes, lesions, ecchymosis.  Neuro: Cranial nerves intact. Normal muscle tone, no cerebellar symptoms. Psych: Awake and oriented X 3, normal affect, Insight and Judgment appropriate.  Medicare Attestation I have personally reviewed: The patient's medical and social history Their use of alcohol, tobacco or illicit drugs Their current medications and  supplements The patient's functional ability including ADLs,fall risks, home safety risks, cognitive, and hearing and visual impairment Diet and physical activities Evidence for depression or mood disorders  The patient's weight, height, BMI, and visual acuity have been recorded in the chart.  I have made referrals, counseling, and provided education to the patient based on review of the above and I have provided the patient with a written personalized care plan for preventive services.    Vicie Mutters, PA-C   10/21/2015

## 2015-11-03 ENCOUNTER — Other Ambulatory Visit: Payer: Self-pay | Admitting: Internal Medicine

## 2015-11-21 ENCOUNTER — Ambulatory Visit (INDEPENDENT_AMBULATORY_CARE_PROVIDER_SITE_OTHER): Payer: Medicare Other | Admitting: *Deleted

## 2015-11-21 DIAGNOSIS — D649 Anemia, unspecified: Secondary | ICD-10-CM

## 2015-11-21 LAB — IRON AND TIBC
%SAT: 37 % (ref 15–60)
IRON: 102 ug/dL (ref 50–180)
TIBC: 274 ug/dL (ref 250–425)
UIBC: 172 ug/dL (ref 125–400)

## 2015-11-21 LAB — CBC WITH DIFFERENTIAL/PLATELET
BASOS ABS: 0 10*3/uL (ref 0.0–0.1)
Basophils Relative: 0 % (ref 0–1)
EOS ABS: 0 10*3/uL (ref 0.0–0.7)
Eosinophils Relative: 1 % (ref 0–5)
HCT: 40.3 % (ref 39.0–52.0)
Hemoglobin: 13 g/dL (ref 13.0–17.0)
LYMPHS ABS: 1.4 10*3/uL (ref 0.7–4.0)
Lymphocytes Relative: 30 % (ref 12–46)
MCH: 31.8 pg (ref 26.0–34.0)
MCHC: 32.3 g/dL (ref 30.0–36.0)
MCV: 98.5 fL (ref 78.0–100.0)
MPV: 10.3 fL (ref 8.6–12.4)
Monocytes Absolute: 0.5 10*3/uL (ref 0.1–1.0)
Monocytes Relative: 11 % (ref 3–12)
NEUTROS PCT: 58 % (ref 43–77)
Neutro Abs: 2.7 10*3/uL (ref 1.7–7.7)
PLATELETS: 163 10*3/uL (ref 150–400)
RBC: 4.09 MIL/uL — ABNORMAL LOW (ref 4.22–5.81)
RDW: 13.9 % (ref 11.5–15.5)
WBC: 4.6 10*3/uL (ref 4.0–10.5)

## 2015-11-21 LAB — FERRITIN: Ferritin: 164 ng/mL (ref 20–380)

## 2015-11-21 LAB — RETICULOCYTES
ABS Retic: 49.1 10*3/uL (ref 19.0–186.0)
RBC.: 4.09 MIL/uL — ABNORMAL LOW (ref 4.22–5.81)
RETIC CT PCT: 1.2 % (ref 0.4–2.3)

## 2015-11-21 NOTE — Progress Notes (Signed)
Patient ID: Timothy Vaughn, male   DOB: Jan 14, 1929, 82 y.o.   MRN: [id number] Patient presents for 1 month recheck CBC, Retic, Iron, Ferritin and Folate per Dr. Idell Pickles orders.

## 2015-11-22 LAB — FOLATE RBC: RBC FOLATE: 881 ng/mL (ref >280)

## 2015-12-01 DIAGNOSIS — L570 Actinic keratosis: Secondary | ICD-10-CM | POA: Diagnosis not present

## 2015-12-01 DIAGNOSIS — C4442 Squamous cell carcinoma of skin of scalp and neck: Secondary | ICD-10-CM | POA: Diagnosis not present

## 2015-12-01 DIAGNOSIS — Z85828 Personal history of other malignant neoplasm of skin: Secondary | ICD-10-CM | POA: Diagnosis not present

## 2015-12-21 ENCOUNTER — Other Ambulatory Visit: Payer: Self-pay | Admitting: Internal Medicine

## 2015-12-29 ENCOUNTER — Encounter: Payer: Self-pay | Admitting: Pulmonary Disease

## 2015-12-29 ENCOUNTER — Ambulatory Visit (INDEPENDENT_AMBULATORY_CARE_PROVIDER_SITE_OTHER): Payer: Medicare Other | Admitting: Pulmonary Disease

## 2015-12-29 VITALS — BP 122/62 | HR 50 | Ht 64.0 in | Wt 143.8 lb

## 2015-12-29 DIAGNOSIS — J984 Other disorders of lung: Secondary | ICD-10-CM

## 2015-12-29 DIAGNOSIS — R21 Rash and other nonspecific skin eruption: Secondary | ICD-10-CM | POA: Diagnosis not present

## 2015-12-29 DIAGNOSIS — M419 Scoliosis, unspecified: Secondary | ICD-10-CM | POA: Diagnosis not present

## 2015-12-29 NOTE — Assessment & Plan Note (Signed)
He has chronic CO2 retention secondary to restrictive lung disease from kyphoscoliosis. However, he only seems to have problems when he has a flareup from an acute illness such as bronchitis. He has done remarkably well for the last 3 months without oxygen or BiPAP. He has no findings right now of hypercarbia or symptoms of hypercarbia so I do not see a reason to treat him with BiPAP at this time. We need to focus on preventing episodes of acute bronchitis. To that and he has had all of his immunizations. We reviewed basic hand hygiene today.  Plan: If he develops excessive sleepiness or fatigue then he needs to have an overnight sleep study Currently, as he is very resistant to starting BiPAP and he's been doing well I do not think that it's necessary to start that at this time. Follow-up with [place] if he has any of these symptoms.

## 2015-12-29 NOTE — Patient Instructions (Signed)
Please see [place] again if you have excessive sleepiness, fatigue or if you develop shortness of breath  I recommend that your family help you apply hydrocortisone cream to the rash, let your PCP know if it doesn't improve

## 2015-12-29 NOTE — Assessment & Plan Note (Signed)
He has a small allergic appearing rash which is pruritic on the left shoulder blade. I recommended hydrocortisone cream.

## 2015-12-29 NOTE — Progress Notes (Signed)
Subjective:    Patient ID: Timothy Vaughn, male    DOB: [birth year], 82 y.o.   MRN: [id number]  Synopsis: Baseline restrictive lung disease from kyphosis.  He was admitted in 09/2015 with bronchitis and had severe acute hypercapnic respiratory failure requiring BIPAP.  With treatment he improved and was discharged with oxygen briefly at home. He has had bronchitis over the years, but he has never been told he had a lung disease.  He adamantly refuses BIPAP because he dislikes the mass.    HPI Chief Complaint  Patient presents with  . Follow-up    Feeling better,sob better with exertion,sneezing,PND,runny nose-clear,no cough. Denies wheezing or cp.Not been wearing oxygen since Feb. 2017   Arland went straight home from the hospital and never ended up going to the nursing home.  He has done well since then and hasn't had any recurrence of respiratory problems. He has not used oxygen since about mid February 2017.     Past Medical History  Diagnosis Date  . Hypertension   . Hyperlipidemia   . Pre-diabetes   . Vitamin D deficiency   . 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 However, he only seems to have problems when he has a flareup from an acute illness such as bronchitis. He has done remarkably well for the last 3 months without oxygen or BiPAP. He has no findings  right now of hypercarbia or symptoms of hypercarbia so I do not see a reason to treat him with BiPAP at this time. We need to focus on preventing episodes of acute bronchitis. To that and he has had all of his immunizations. We reviewed basic hand hygiene today.  Plan: If he develops excessive sleepiness or fatigue then he needs to have an overnight sleep study Currently, as he is very resistant to starting BiPAP and he's been doing well I do not think that it's necessary to start that at this time. Follow-up with [place] if he has any of these symptoms.  Rash and nonspecific skin eruption He has a small allergic appearing rash which is pruritic on the left shoulder blade. I recommended hydrocortisone cream.     Current outpatient prescriptions:  .  Albuterol Sulfate (PROAIR RESPICLICK) 123XX123 (90 BASE) MCG/ACT AEPB, Inhale 2 Act into the lungs every 6 (six) hours as needed (for shortness of breath)., Disp: 1 each, Rfl: 0 .  Ascorbic Acid (VITAMIN C) 100 MG tablet, Take 100 mg by mouth daily., Disp: , Rfl:  .  aspirin 81 MG tablet, Take 81 mg by mouth daily., Disp: , Rfl:  .  Cholecalciferol (VITAMIN D PO), Take 8,000 Int'l Units by mouth daily. , Disp: , Rfl:  .  furosemide (LASIX) 40 MG tablet, Take 40 mg by mouth daily as needed for  fluid or edema. , Disp: , Rfl:  .  lactobacillus acidophilus & bulgar (LACTINEX) chewable tablet, Chew 1 tablet by mouth 3 (three) times daily with meals., Disp: 15 tablet, Rfl: 0 .  Multiple Vitamin (MULTI VITAMIN DAILY PO), Take 1 tablet by mouth daily. , Disp: , Rfl:  .  Omega-3 Fatty Acids (FISH OIL PO), Take 1 capsule by mouth daily. , Disp: , Rfl:  .  potassium chloride SA (K-DUR,KLOR-CON) 20 MEQ tablet, TAKE 2 TABLETS (40 MEQ TOTAL) BY MOUTH DAILY., Disp: 60 tablet, Rfl: 0 .  pravastatin (PRAVACHOL) 40 MG tablet, TAKE 1 TABLET AT BEDTIME FOR CHOLESTEROL, Disp: 90 tablet, Rfl: 1

## 2016-01-18 ENCOUNTER — Encounter: Payer: Self-pay | Admitting: Internal Medicine

## 2016-01-18 ENCOUNTER — Ambulatory Visit (INDEPENDENT_AMBULATORY_CARE_PROVIDER_SITE_OTHER): Payer: Medicare Other | Admitting: Internal Medicine

## 2016-01-18 VITALS — BP 120/58 | HR 64 | Temp 98.4°F | Resp 16 | Ht 64.0 in | Wt 142.0 lb

## 2016-01-18 DIAGNOSIS — J069 Acute upper respiratory infection, unspecified: Secondary | ICD-10-CM | POA: Diagnosis not present

## 2016-01-18 MED ORDER — FLUTICASONE PROPIONATE 50 MCG/ACT NA SUSP: 2.0000 | Freq: Every day | NASAL | Status: DC

## 2016-01-18 MED ORDER — AZITHROMYCIN 250 MG PO TABS: ORAL_TABLET | ORAL | Status: DC

## 2016-01-18 MED ORDER — PREDNISONE 20 MG PO TABS: ORAL_TABLET | ORAL | Status: DC

## 2016-01-18 NOTE — Progress Notes (Signed)
HPI  Patient presents to the office for evaluation of sinus congestion and sore throat.  It has been going on for 2 days.  Patient reports night > day, dry, worse with lying down.  They also endorse change in voice, postnasal drip, shortness of breath, sputum production, wheezing and sinus pressure, rhinorrhea, nasal congestion, sore throat..  They have tried salt water gargles.  They report that nothing has worked.  They admits to other sick contacts.  His grandson was sick last week when he was visiting.    Review of Systems  Constitutional: Negative for fever, chills and malaise/fatigue.  HENT: Positive for congestion, ear pain and sore throat.   Respiratory: Positive for cough. Negative for shortness of breath and wheezing.   Cardiovascular: Negative for chest pain, palpitations and leg swelling.  Skin: Negative.   Neurological: Negative for headaches.    PE:  Filed Vitals:   01/18/16 1511  BP: 120/58  Pulse: 64  Temp: 98.4 F (36.9 C)  Resp: 16    General:  Alert and non-toxic, WDWN, NAD HEENT: NCAT, PERLA, EOM normal, no occular discharge or erythema.  Nasal mucosal edema with sinus tenderness to palpation.  Oropharynx clear with minimal oropharyngeal edema and erythema.  Mucous membranes moist and pink. Neck:  Cervical adenopathy Chest:  RRR no MRGs.  Lungs clear to auscultation A&P with no wheezes with pursed lip breathing, no rhonchi or rales.  Kyphoscoliosis present. Abdomen: +BS x 4 quadrants, soft, non-tender, no guarding, rigidity, or rebound. Skin: warm and dry no rash Neuro: A&Ox4, CN II-XII grossly intact  Assessment and Plan:   1. Acute URI -zpak -prednisone -flonase -zyrtec -nasal saline -he does note that he has cough syrup he can use at home.

## 2016-01-18 NOTE — Patient Instructions (Signed)
Please take prednisone as prescribed until it is gone.  Please use flonase 2 sprays per nostril before bedtime.  Please use zyrtec nightly before bedtime.  Take 1 tablet.  Please use your albuterol inhaler if you have bad coughing or shortness of breath.  Please take your cough syrup at home up to 3 times per day as needed.  Please take the zpak until it is all the way gone.

## 2016-01-24 ENCOUNTER — Other Ambulatory Visit: Payer: Self-pay | Admitting: *Deleted

## 2016-01-24 MED ORDER — FUROSEMIDE 40 MG PO TABS: 40.0000 mg | ORAL_TABLET | Freq: Every day | ORAL | Status: DC | PRN

## 2016-01-31 ENCOUNTER — Other Ambulatory Visit: Payer: Self-pay | Admitting: *Deleted

## 2016-01-31 MED ORDER — FUROSEMIDE 40 MG PO TABS: 40.0000 mg | ORAL_TABLET | Freq: Every day | ORAL | Status: DC | PRN

## 2016-02-13 DIAGNOSIS — L820 Inflamed seborrheic keratosis: Secondary | ICD-10-CM | POA: Diagnosis not present

## 2016-02-13 DIAGNOSIS — C44212 Basal cell carcinoma of skin of right ear and external auricular canal: Secondary | ICD-10-CM | POA: Diagnosis not present

## 2016-02-13 DIAGNOSIS — C44619 Basal cell carcinoma of skin of left upper limb, including shoulder: Secondary | ICD-10-CM | POA: Diagnosis not present

## 2016-02-13 DIAGNOSIS — R208 Other disturbances of skin sensation: Secondary | ICD-10-CM | POA: Diagnosis not present

## 2016-02-13 DIAGNOSIS — L821 Other seborrheic keratosis: Secondary | ICD-10-CM | POA: Diagnosis not present

## 2016-02-13 DIAGNOSIS — D1801 Hemangioma of skin and subcutaneous tissue: Secondary | ICD-10-CM | POA: Diagnosis not present

## 2016-02-13 DIAGNOSIS — Z85828 Personal history of other malignant neoplasm of skin: Secondary | ICD-10-CM | POA: Diagnosis not present

## 2016-03-01 ENCOUNTER — Encounter: Payer: Self-pay | Admitting: Internal Medicine

## 2016-03-01 ENCOUNTER — Ambulatory Visit (INDEPENDENT_AMBULATORY_CARE_PROVIDER_SITE_OTHER): Payer: Medicare Other | Admitting: Internal Medicine

## 2016-03-01 VITALS — BP 106/68 | HR 68 | Temp 97.9°F | Resp 16 | Ht 64.0 in | Wt 141.2 lb

## 2016-03-01 DIAGNOSIS — J0410 Acute tracheitis without obstruction: Secondary | ICD-10-CM | POA: Diagnosis not present

## 2016-03-01 MED ORDER — AZITHROMYCIN 250 MG PO TABS: ORAL_TABLET | ORAL | Status: DC

## 2016-03-01 MED ORDER — PREDNISONE 20 MG PO TABS
ORAL_TABLET | ORAL | Status: DC
Start: 2016-03-01 — End: 2016-03-14

## 2016-03-01 NOTE — Progress Notes (Signed)
  Subjective:    Patient ID: Timothy Vaughn, male    DOB: 1929-06-16, 82 y.o.   MRN: [id number]  HPI  This very nice 82 yo MWM with restrictive lung Dz secondary to severe rotokyphoscoliosis presented with 24 hr prodrome of fever chills & dry hacking cough. Denies sweats, sputum or rash, but does endorse chronic dyspnea.   Medication Sig  . PROAIR RESPICLICK Inhale 2 Act into the lungs every 6 (six) hours as needed (for shortness of breath).  Marland Kitchen VITAMIN C 100 MG tablet Take 100 mg by mouth daily.  Marland Kitchen aspirin 81 MG tablet Take 81 mg by mouth daily.  Marland Kitchen VITAMIN D  Take 8,000 Int'l Units by mouth daily.   Marland Kitchen FLONASE nasal spray Place 2 sprays into both nostrils daily.  . furosemide  40 MG tablet Take 1 tablet (40 mg total) by mouth daily as needed for fluid or edema.  . Multiple Vitamin  Take 1 tablet by mouth daily.   . Omega-3 FISH OIL Take 1 capsule by mouth daily.   . potassium chloride 20 MEQ tablet TAKE 2 TABLETS (40 MEQ TOTAL) BY MOUTH DAILY.  . pravastatin  40 MG tablet TAKE 1 TABLET AT BEDTIME FOR CHOLESTEROL  . LACTINEX chewable tablet Chew 1 tablet by mouth 3 (three) times daily with meals.   Allergies  Allergen Reactions  . Lipitor [Atorvastatin] Other (See Comments)    Severe NIV  . Niaspan [Niacin Er] Itching   Past Medical History  Diagnosis Date  . Hypertension   . Hyperlipidemia   . Pre-diabetes   . Vitamin D deficiency   . Cancer [facility])     prostate   Review of Systems  10 point systems review negative except as above.    Objective:   Physical Exam  BP 106/68   Pulse 68  Temp 97.9 F  Resp 16  Ht 5\' 4"  Wt 141 lb 3.2 oz    BMI 24.23     O2 sat on Rm Air = 92%  In no respiratory distress with dry hacking cough.   Skin - clear exposed w/o rash or cyanosis  HEENT - Eac's patent. TM's Nl. EOM's full. PERRLA. NasoOroPharynx clear. Neck - supple. Nl Thyroid. Carotids 2+ & No bruits, nodes, JVD Chest - Clear equal BS w/ few dry rales & no  Rhonchi or  wheezes. Cor - Nl HS. RRR w/o sig MGR. No edema. MS- FROM w/o deformities. Muscle power, tone and bulk Nl. Gait Nl. Neuro - Nl w/o focal abnormalities. Psyche - Mental status normal & appropriate.  No delusions, ideations or obvious mood abnormalities.    Assessment & Plan:   1. Tracheitis  - predniSONE (DELTASONE) 20 MG tablet; 1 tab 3 x day for 2 days, then 1 tab 2 x day for 2 days, then 1 tab 1 x day for 3 days  Dispense: 13 tablet; Refill: 1 - azithromycin (ZITHROMAX) 250 MG tablet; Take 2 tablets (500 mg) on  Day 1,  followed by 1 tablet (250 mg) once daily on Days 2 through 5.  Dispense: 6 each; Refill: 1  - discussed meds & SE's. Encouraged to purchase a personal pulse oximeter.

## 2016-03-05 ENCOUNTER — Other Ambulatory Visit: Payer: Self-pay | Admitting: Internal Medicine

## 2016-03-08 ENCOUNTER — Other Ambulatory Visit: Payer: Self-pay | Admitting: Internal Medicine

## 2016-03-14 ENCOUNTER — Ambulatory Visit (INDEPENDENT_AMBULATORY_CARE_PROVIDER_SITE_OTHER): Payer: Medicare Other | Admitting: Internal Medicine

## 2016-03-14 ENCOUNTER — Encounter: Payer: Self-pay | Admitting: Internal Medicine

## 2016-03-14 VITALS — BP 90/56 | HR 68 | Temp 97.9°F | Resp 16 | Ht 64.0 in | Wt 138.0 lb

## 2016-03-14 DIAGNOSIS — I10 Essential (primary) hypertension: Secondary | ICD-10-CM | POA: Diagnosis not present

## 2016-03-14 DIAGNOSIS — Z79899 Other long term (current) drug therapy: Secondary | ICD-10-CM

## 2016-03-14 DIAGNOSIS — M419 Scoliosis, unspecified: Secondary | ICD-10-CM | POA: Diagnosis not present

## 2016-03-14 DIAGNOSIS — R0689 Other abnormalities of breathing: Secondary | ICD-10-CM | POA: Diagnosis not present

## 2016-03-14 DIAGNOSIS — J984 Other disorders of lung: Secondary | ICD-10-CM

## 2016-03-14 LAB — BASIC METABOLIC PANEL WITH GFR
BUN: 21 mg/dL (ref 7–25)
CALCIUM: 8.5 mg/dL — AB (ref 8.6–10.3)
CHLORIDE: 100 mmol/L (ref 98–110)
CO2: 35 mmol/L — ABNORMAL HIGH (ref 20–31)
CREATININE: 0.96 mg/dL (ref 0.70–1.11)
GFR, EST AFRICAN AMERICAN: 84 mL/min (ref >=60)
GFR, Est Non African American: 72 mL/min (ref >=60)
Glucose, Bld: 81 mg/dL (ref 65–99)
Potassium: 4.4 mmol/L (ref 3.5–5.3)
Sodium: 138 mmol/L (ref 135–146)

## 2016-03-14 LAB — CBC WITH DIFFERENTIAL/PLATELET
BASOS ABS: 0 {cells}/uL (ref 0–200)
Basophils Relative: 0 %
EOS ABS: 0 {cells}/uL — AB (ref 15–500)
Eosinophils Relative: 0 %
HCT: 38.7 % (ref 38.5–50.0)
Hemoglobin: 13 g/dL — ABNORMAL LOW (ref 13.2–17.1)
LYMPHS PCT: 40 %
Lymphs Abs: 3880 cells/uL (ref 850–3900)
MCH: 31.9 pg (ref 27.0–33.0)
MCHC: 33.6 g/dL (ref 32.0–36.0)
MCV: 94.9 fL (ref 80.0–100.0)
MONOS PCT: 11 %
MPV: 10.8 fL (ref 7.5–12.5)
Monocytes Absolute: 1067 cells/uL — ABNORMAL HIGH (ref 200–950)
NEUTROS PCT: 49 %
Neutro Abs: 4753 cells/uL (ref 1500–7800)
PLATELETS: 154 10*3/uL (ref 140–400)
RBC: 4.08 MIL/uL — ABNORMAL LOW (ref 4.20–5.80)
RDW: 13.1 % (ref 11.0–15.0)
WBC: 9.7 10*3/uL (ref 3.8–10.8)

## 2016-03-14 NOTE — Progress Notes (Signed)
Patient ID: Timothy Vaughn, male   DOB: 03-May-1929, 82 y.o.   MRN: [id number]  [facility]                       Unk Pinto, M.D.        Uvaldo Bristle. Silverio Lay, P.A.-C       Starlyn Skeans, P.A.-C   [facility]                [address] [place] [id number] Telephone [phone] Telefax [phone] ______________________________________________________________________     This very nice 82 y.o. MWM presents for follow up with Hypertension, Hyperlipidemia, Pre-Diabetes and Vitamin D Deficiency. And also for c/o malaise since hospitalized 1/3-05/2016 with acute  hypercapneic  respiratory failure attributed to CAP compromising his severe restrictive Lung Dz consequent of his severe thoracolumbar kyphoscoliosis. Patient recovered, but still c/o DOE and rapid fatigability. Denies any cough, congestion, sputum pdn, fever, chills, sweats, rash, GI or GU sx's. Reports home O2 sats are ranging 92-95%.      Patient is treated for HTN & BP has been controlled at home. Today's BPwa s90 /56 and rechecked at 104/72. Patient has had no complaints of any cardiac type chest pain, palpitations, dyspnea/orthopnea/PND, dizziness, claudication, or dependent edema.     Hyperlipidemia is controlled with diet & meds. Patient denies myalgias or other med SE's. Last Lipids were  Cholesterol 181; HDL 58; LDL 101; Triglycerides 108 on 10/20/18.     Also, the patient has history of PreDiabetes and has had no symptoms of reactive hypoglycemia, diabetic polys, paresthesias or visual blurring.  Last A1c was 6.2% on 10/21/2015.     Further, the patient also has history of Vitamin D Deficiency and supplements vitamin D without any suspected side-effects. Last vitamin D was 59 on 04/12/2015.  Medication Sig  . PROAIR RESPICLICK Inhale 2 Act into the lungs every 6 (six) hours as needed (for shortness of breath).  Marland Kitchen VITAMIN C  100 MG  Take 100 mg by mouth daily.  Marland Kitchen aspirin 81 MG Take 81 mg by mouth daily.  Marland Kitchen VITAMIN D  Take 8,000 Int'l Units by mouth daily.   . fluticasone FLONASE nasal spray Place 2 sprays into both nostrils daily.  . Furosemide  40 MG Take 1 tablet (40 mg total) by mouth daily as needed for fluid or edema.  . Multiple Vitamin Take 1 tablet by mouth daily.   . Omega-3 FISH OIL  Take 1 capsule by mouth daily.   . potassium chloride  20 MEQ  TAKE 2 TABLETS BY MOUTH DAILY.  . pravastatin  40 MG  TAKE 1 TABLET AT BEDTIME FOR CHOLESTEROL   Allergies  Allergen Reactions  . Lipitor [Atorvastatin] Other (See Comments)    Severe NIV  . Niaspan [Niacin Er] Itching   PMHx:   Past Medical History  Diagnosis Date  . Hypertension   . Hyperlipidemia   . Pre-diabetes   . Vitamin D deficiency   . Cancer [facility])     prostate   Immunization History  Administered Date(s) Administered  . DTaP 09/03/2005  . Influenza Whole 05/20/2013  . Influenza, High Dose Seasonal PF 06/04/2014, 06/09/2015  . Pneumococcal Conjugate-13 10/21/2015  . Pneumococcal Polysaccharide-23 02/05/2012  . Zoster 09/25/2005   Past Surgical History  Procedure Laterality Date  . Prostatectomy  2001  FHx:    Reviewed / unchanged  SHx:    Reviewed / unchanged  Systems Review:  Constitutional: Denies fever, chills, wt changes, headaches, insomnia, fatigue, night sweats, change in appetite. Eyes: Denies redness, blurred vision, diplopia, discharge, itchy, watery eyes.  ENT: Denies discharge, congestion, post nasal drip, epistaxis, sore throat, earache, hearing loss, dental pain, tinnitus, vertigo, sinus pain, snoring.  CV: Denies chest pain, palpitations, irregular heartbeat, syncope, dyspnea, diaphoresis, orthopnea, PND, claudication or edema. Respiratory: as above Gastrointestinal: Denies dysphagia, odynophagia, heartburn, reflux, water brash, abdominal pain or cramps, nausea, vomiting, bloating, diarrhea, constipation, hematemesis,  melena, hematochezia  or hemorrhoids. Genitourinary: Denies dysuria, frequency, urgency, nocturia, hesitancy, discharge, hematuria or flank pain. Musculoskeletal: Denies arthralgias, myalgias, stiffness, jt. swelling, pain, limping or strain/sprain.  Skin: Denies pruritus, rash, hives, warts, acne, eczema or change in skin lesion(s). Neuro: No weakness, tremor, incoordination, spasms, paresthesia or pain. Psychiatric: Denies confusion, memory loss or sensory loss. Endo: Denies change in weight, skin or hair change.  Heme/Lymph: No excessive bleeding, bruising or enlarged lymph nodes.  Physical Exam  BP 90/56  rechecked at 104/72   Pulse 68  Temp  97.9 F   Resp 16  Ht 5\' 4"    Wt 138 lb    BMI 23.68  Resting Rm Air O2 Sat = 93-94% and walking 100' briskly O2 Sat dropped to 89% with patient breathing heavily   Appears well nourished and in no distress. Eyes: PERRLA, EOMs, conjunctiva no swelling or erythema. Sinuses: No frontal/maxillary tenderness ENT/Mouth: EAC's clear, TM's nl w/o erythema, bulging. Nares clear w/o erythema, swelling, exudates. Oropharynx clear without erythema or exudates. Oral hygiene is good. Tongue normal, non obstructing. Hearing intact.  Neck: Supple. Thyroid nl. Car 2+/2+ without bruits, nodes or JVD. Chest:  BS decreased w/o rales, rhonchi, wheezing or stridor.  Severe kypho roto thorolumbar scoliosis with thoracic hump to left upper back. Cor: Heart sounds normal w/ regular rate and rhythm without sig. murmurs, gallops, clicks, or rubs. Peripheral pulses normal and equal  without edema.  Abdomen: Soft & bowel sounds normal. Non-tender w/o guarding, rebound, hernias, masses, or organomegaly.  Lymphatics: Unremarkable.  Musculoskeletal: Full ROM all peripheral extremities, joint stability, 5/5 strength, and normal gait.  Skin: Warm, dry without exposed rashes, lesions or ecchymosis apparent.  Neuro: Cranial nerves intact, reflexes equal bilaterally.  Sensory-motor testing grossly intact. Tendon reflexes grossly intact.  Pysch: Alert & oriented x 3.  Insight and judgement nl & appropriate. No ideations.  Assessment and Plan:  1. Essential hypertension  - Continue medication, monitor blood pressure at home. Continue DASH diet. Reminder to go to the ER if any CP, SOB, nausea, dizziness, severe HA, changes vision/speech, left arm numbness and tingling and jaw pain.  2. 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 Respiratory insufficiency   4. Medication management  - CBC with Differential/Platelet - BASIC METABOLIC PANEL WITH GFR   Recommended regular exercise w/ stationary bike for pulmonary conditioning,  BP monitoring, weight control, and discussed med and SE's. Recommended labs to assess and monitor clinical status. Further disposition pending results of labs. Over 30 minutes of exam, counseling, chart review was performed

## 2016-03-14 NOTE — Patient Instructions (Addendum)
Chronic Obstructive Pulmonary Disease Chronic obstructive pulmonary disease (COPD) is a common lung condition in which airflow from the lungs is limited. COPD is a general term that can be used to describe many different lung problems that limit airflow, including both chronic bronchitis and emphysema. If you have COPD, your lung function will probably never return to normal, but there are measures you can take to improve lung function and make yourself feel better. CAUSES   Smoking (common).  Exposure to secondhand smoke.  Genetic problems.  Chronic inflammatory lung diseases or recurrent infections. SYMPTOMS  Shortness of breath, especially with physical activity.  Deep, persistent (chronic) cough with a large amount of thick mucus.  Wheezing.  Rapid breaths (tachypnea).  Gray or bluish discoloration (cyanosis) of the skin, especially in your fingers, toes, or lips.  Fatigue.  Weight loss.  Frequent infections or episodes when breathing symptoms become much worse (exacerbations).  Chest tightness. DIAGNOSIS Your health care provider will take a medical history and perform a physical examination to diagnose COPD. Additional tests for COPD may include:  Lung (pulmonary) function tests.  Chest X-ray.  CT scan.  Blood tests. TREATMENT  Treatment for COPD may include:  Inhaler and nebulizer medicines. These help manage the symptoms of COPD and make your breathing more comfortable.  Supplemental oxygen. Supplemental oxygen is only helpful if you have a low oxygen level in your blood.  Exercise and physical activity. These are beneficial for nearly all people with COPD.  Lung surgery or transplant.  Nutrition therapy to gain weight, if you are underweight.  Pulmonary rehabilitation. This may involve working with a team of health care providers and specialists, such as respiratory, occupational, and physical therapists. HOME CARE INSTRUCTIONS  Take all medicines  (inhaled or pills) as directed by your health care provider.  Avoid over-the-counter medicines or cough syrups that dry up your airway (such as antihistamines) and slow down the elimination of secretions unless instructed otherwise by your health care provider.  If you are a smoker, the most important thing that you can do is stop smoking. Continuing to smoke will cause further lung damage and breathing trouble. Ask your health care provider for help with quitting smoking. He or she can direct you to community resources or hospitals that provide support.  Avoid exposure to irritants such as smoke, chemicals, and fumes that aggravate your breathing.  Use oxygen therapy and pulmonary rehabilitation if directed by your health care provider. If you require home oxygen therapy, ask your health care provider whether you should purchase a pulse oximeter to measure your oxygen level at home.  Avoid contact with individuals who have a contagious illness.  Avoid extreme temperature and humidity changes.  Eat healthy foods. Eating smaller, more frequent meals and resting before meals may help you maintain your strength.  Stay active, but balance activity with periods of rest. Exercise and physical activity will help you maintain your ability to do things you want to do.  Preventing infection and hospitalization is very important when you have COPD. Make sure to receive all the vaccines your health care provider recommends, especially the pneumococcal and influenza vaccines. Ask your health care provider whether you need a pneumonia vaccine.  Learn and use relaxation techniques to manage stress.  Learn and use controlled breathing techniques as directed by your health care provider. Controlled breathing techniques include:  Pursed lip breathing. Start by breathing in (inhaling) through your nose for 1 second. Then, purse your lips as if you were   going to whistle and breathe out (exhale) through the  pursed lips for 2 seconds.  Diaphragmatic breathing. Start by putting one hand on your abdomen just above your waist. Inhale slowly through your nose. The hand on your abdomen should move out. Then purse your lips and exhale slowly. You should be able to feel the hand on your abdomen moving in as you exhale.  Learn and use controlled coughing to clear mucus from your lungs. Controlled coughing is a series of short, progressive coughs. The steps of controlled coughing are: 1. Lean your head slightly forward. 2. Breathe in deeply using diaphragmatic breathing. 3. Try to hold your breath for 3 seconds. 4. Keep your mouth slightly open while coughing twice. 5. Spit any mucus out into a tissue. 6. Rest and repeat the steps once or twice as needed. SEEK MEDICAL CARE IF:  You are coughing up more mucus than usual.  There is a change in the color or thickness of your mucus.  Your breathing is more labored than usual.  Your breathing is faster than usual. SEEK IMMEDIATE MEDICAL CARE IF:  You have shortness of breath while you are resting.  You have shortness of breath that prevents you from:  Being able to talk.  Performing your usual physical activities.  You have chest pain lasting longer than 5 minutes.  Your skin color is more cyanotic than usual.  You measure low oxygen saturations for longer than 5 minutes with a pulse oximeter. MAKE SURE YOU:  Understand these instructions.  Will watch your condition.  Will get help right away if you are not doing well or get worse.

## 2016-03-19 ENCOUNTER — Ambulatory Visit (INDEPENDENT_AMBULATORY_CARE_PROVIDER_SITE_OTHER): Payer: Medicare Other | Admitting: Internal Medicine

## 2016-03-19 ENCOUNTER — Encounter: Payer: Self-pay | Admitting: Internal Medicine

## 2016-03-19 DIAGNOSIS — M419 Scoliosis, unspecified: Secondary | ICD-10-CM

## 2016-03-19 DIAGNOSIS — R0689 Other abnormalities of breathing: Secondary | ICD-10-CM

## 2016-03-19 DIAGNOSIS — J9601 Acute respiratory failure with hypoxia: Secondary | ICD-10-CM

## 2016-03-19 DIAGNOSIS — J984 Other disorders of lung: Secondary | ICD-10-CM

## 2016-03-19 NOTE — Progress Notes (Signed)
  Subjective:    Patient ID: Timothy Vaughn, male    DOB: 04-Feb-1929, 82 y.o.   MRN: [id number]  HPI  Patient is an 82 yo MWM with severe restrictive lung disease and hx/o recurrent lower respiratory infections who called reporting O2 sat's had been running in the low 80's with associated dyspnea. Denies any recent cough, congestion, CP or Orthopnea/PND.    Medication Sig  . Albuterol Sulfate (PROAIR RESPICLICK) 123XX123 (90 BASE) MCG/ACT AEPB Inhale 2 Act into the lungs every 6 (six) hours as needed (for shortness of breath).  . Ascorbic Acid (VITAMIN C) 100 MG tablet Take 100 mg by mouth daily.  Marland Kitchen aspirin 81 MG tablet Take 81 mg by mouth daily.  . Cholecalciferol (VITAMIN D PO) Take 8,000 Int'l Units by mouth daily.   . fluticasone (FLONASE) 50 MCG/ACT nasal spray Place 2 sprays into both nostrils daily.  . furosemide (LASIX) 40 MG tablet Take 1 tablet (40 mg total) by mouth daily as needed for fluid or edema.  . Multiple Vitamin (MULTI VITAMIN DAILY PO) Take 1 tablet by mouth daily.   . Omega-3 Fatty Acids (FISH OIL PO) Take 1 capsule by mouth daily.   . potassium chloride SA (K-DUR,KLOR-CON) 20 MEQ tablet TAKE 2 TABLETS BY MOUTH DAILY.  . pravastatin (PRAVACHOL) 40 MG tablet TAKE 1 TABLET AT BEDTIME FOR CHOLESTEROL   Allergies  Allergen Reactions  . Lipitor [Atorvastatin] Other (See Comments)    Severe NIV  . Niaspan [Niacin Er] Itching   Past Medical History  Diagnosis Date  . Hypertension   . Hyperlipidemia   . Pre-diabetes   . Vitamin D deficiency   . Cancer [facility])     prostate   Past Surgical History  Procedure Laterality Date  . Prostatectomy  2001   Review of Systems  10 point systems review negative except as above.     Objective:   Physical Exam   Baseline O2 sat = 88% on Rm Air at rest & dropped to 84% on Rm Air with exercise walking 100 ' briskly and on supplemental O2 recovered to 99%.     Assessment & Plan:   1. Restrictive lung disease due to  kyphoscoliosis  - For home use only DME oxygen  2. Respiratory insufficiency  - For home use only DME oxygen  3. Acute respiratory failure with hypoxia (HCC)  - For home use only DME oxygen   Recommended stationary and portable O2 at a flow rate of 2 l/min with a conserving device

## 2016-04-19 IMAGING — DX DG CHEST 2V
2 series · 2 of 2 positions shown · non-contrast
Comparison: 09/07/2015

CLINICAL DATA: Acute respiratory failure, hypoxemia

EXAM:
CHEST  2 VIEW

[chest pa]
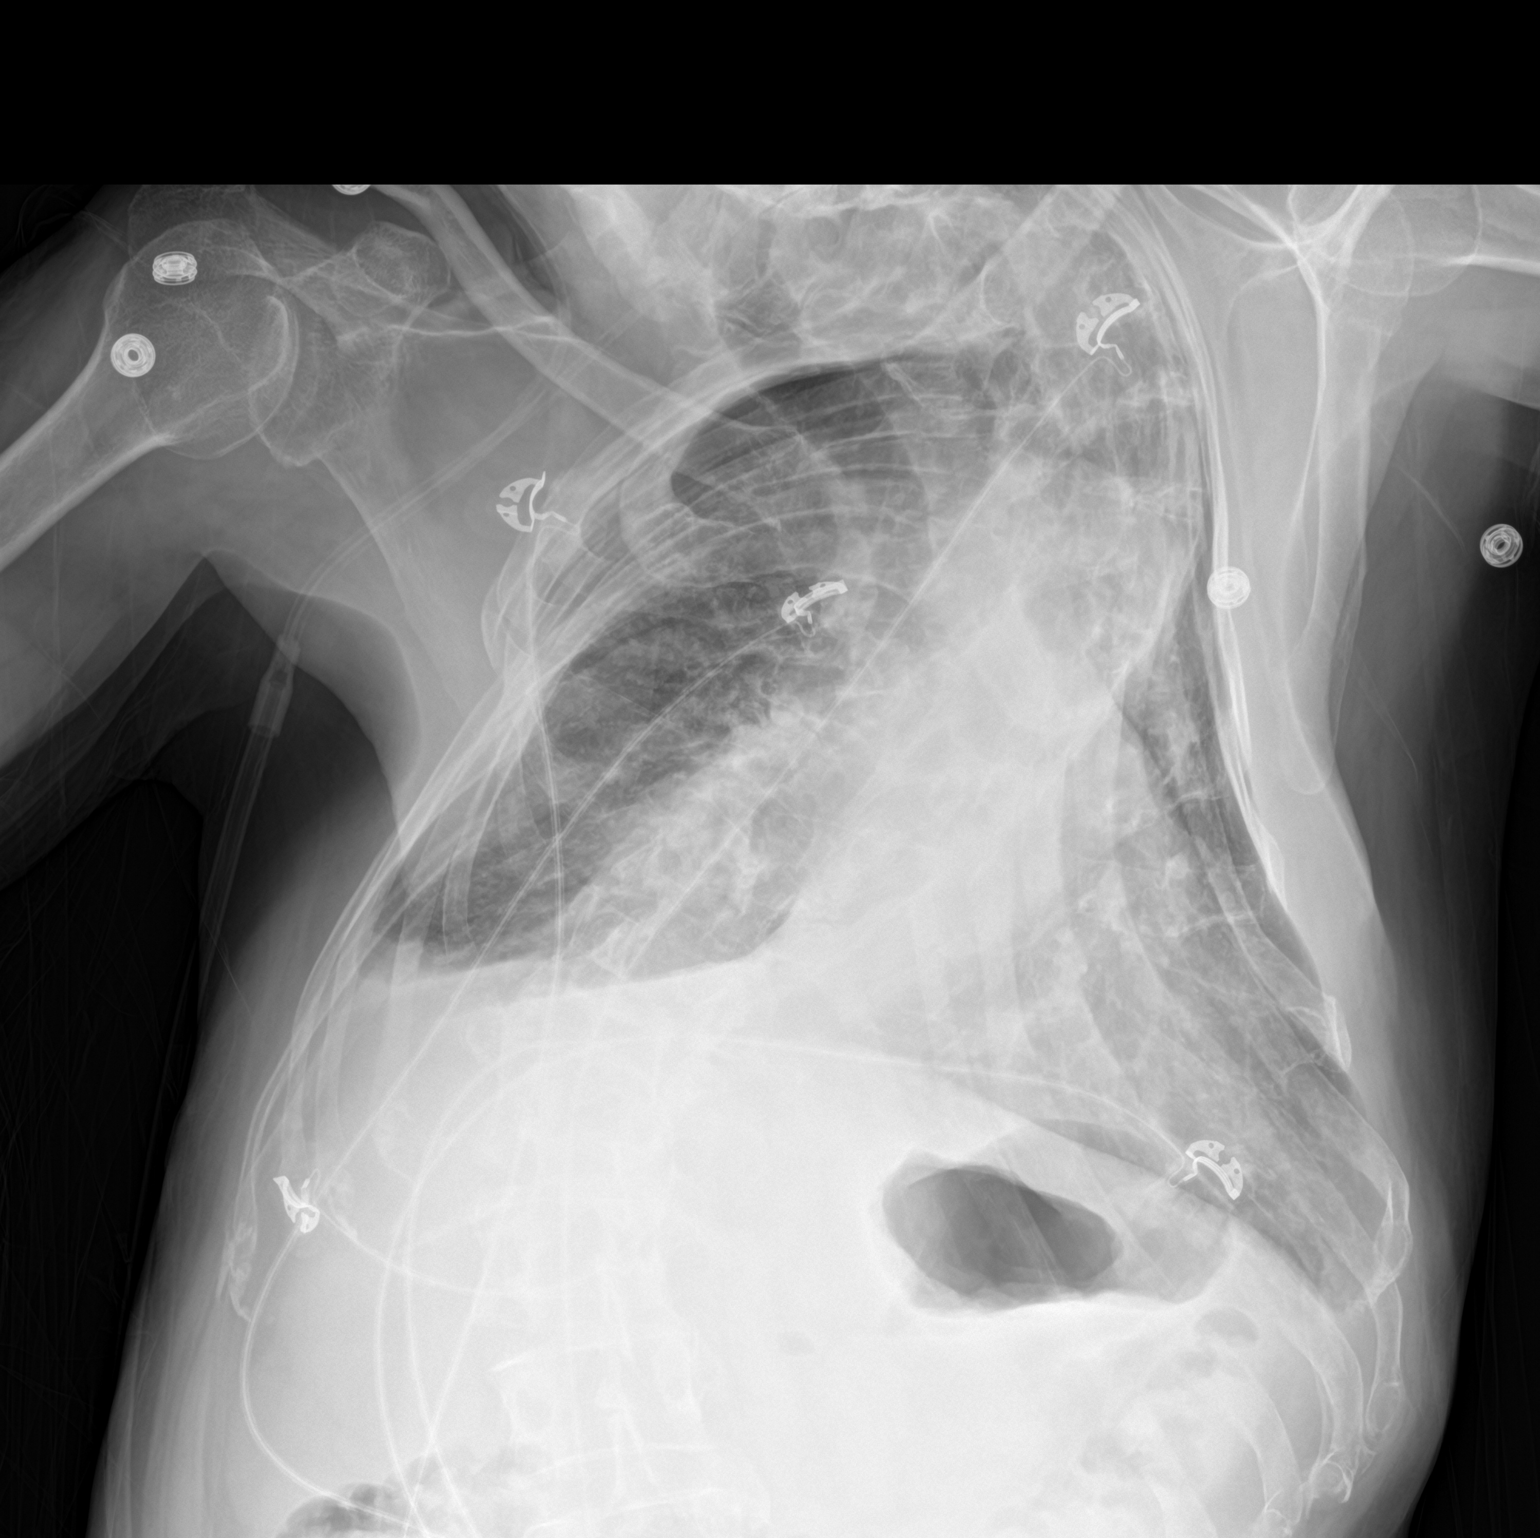

[chest lat]
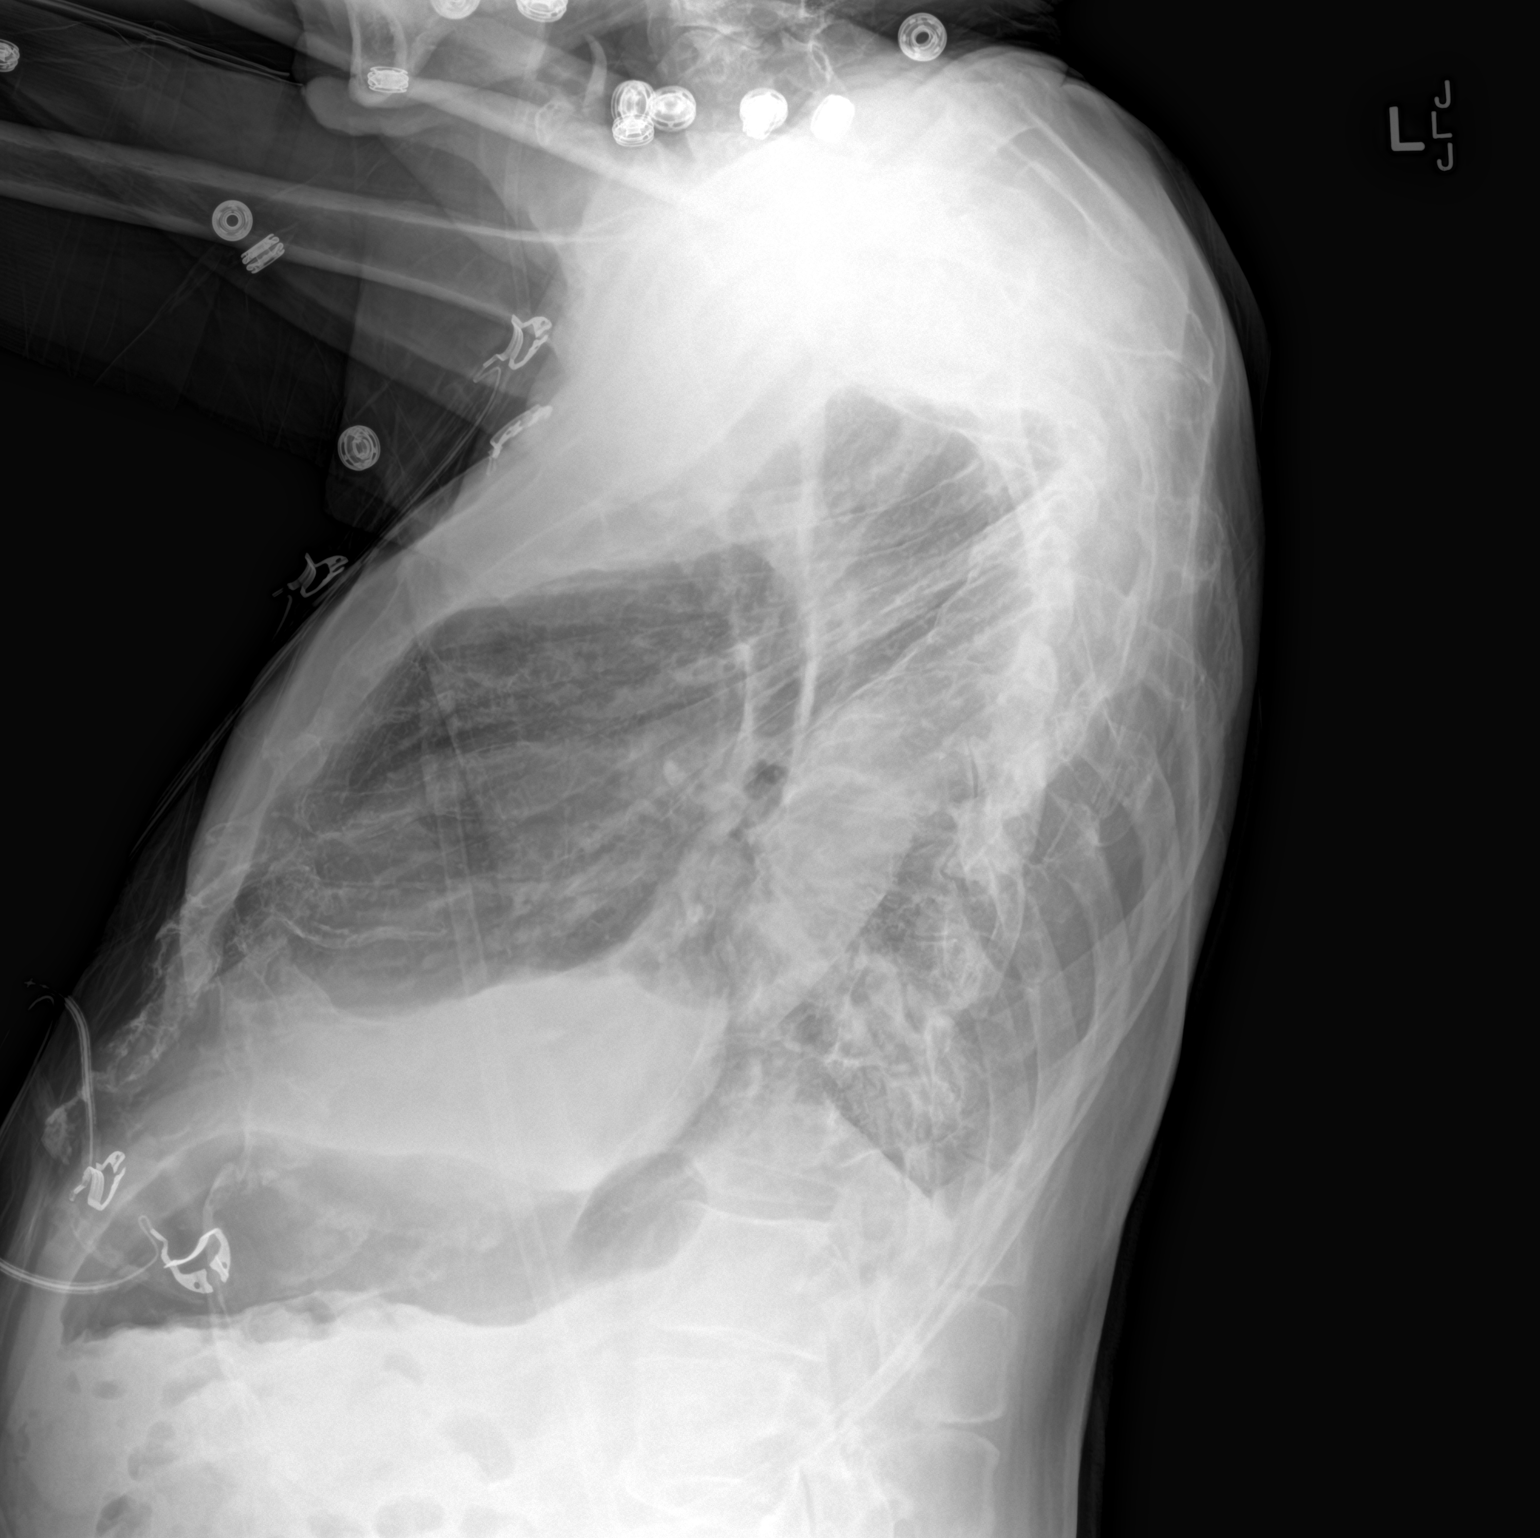

[2 of 2 positions shown; findings below may reference images not displayed]

FINDINGS: Study is limited by patient rotation and significant levoscoliosis
of lower cervical and thoracic spine. There is dextroscoliosis of
the lumbar spine. No convincing pulmonary edema. Slight improvement
in aeration. Streaky residual right basilar atelectasis or
infiltrate.
IMPRESSION: Limited study due to significant scoliosis and patient rotation. No
convincing pulmonary edema. Slight improvement in aeration. Streaky
residual right basilar atelectasis or infiltrate.

## 2016-04-24 DIAGNOSIS — H43813 Vitreous degeneration, bilateral: Secondary | ICD-10-CM | POA: Diagnosis not present

## 2016-04-24 DIAGNOSIS — H52223 Regular astigmatism, bilateral: Secondary | ICD-10-CM | POA: Diagnosis not present

## 2016-04-24 DIAGNOSIS — H2513 Age-related nuclear cataract, bilateral: Secondary | ICD-10-CM | POA: Diagnosis not present

## 2016-04-24 DIAGNOSIS — H524 Presbyopia: Secondary | ICD-10-CM | POA: Diagnosis not present

## 2016-04-24 DIAGNOSIS — H5203 Hypermetropia, bilateral: Secondary | ICD-10-CM | POA: Diagnosis not present

## 2016-04-24 DIAGNOSIS — H31001 Unspecified chorioretinal scars, right eye: Secondary | ICD-10-CM | POA: Diagnosis not present

## 2016-05-03 ENCOUNTER — Ambulatory Visit (INDEPENDENT_AMBULATORY_CARE_PROVIDER_SITE_OTHER): Payer: Medicare Other | Admitting: Internal Medicine

## 2016-05-03 ENCOUNTER — Encounter: Payer: Self-pay | Admitting: Internal Medicine

## 2016-05-03 VITALS — BP 100/58 | HR 68 | Temp 97.5°F | Resp 16 | Ht 64.0 in | Wt 138.8 lb

## 2016-05-03 DIAGNOSIS — Z136 Encounter for screening for cardiovascular disorders: Secondary | ICD-10-CM | POA: Diagnosis not present

## 2016-05-03 DIAGNOSIS — I10 Essential (primary) hypertension: Secondary | ICD-10-CM | POA: Diagnosis not present

## 2016-05-03 DIAGNOSIS — E782 Mixed hyperlipidemia: Secondary | ICD-10-CM

## 2016-05-03 DIAGNOSIS — Z1212 Encounter for screening for malignant neoplasm of rectum: Secondary | ICD-10-CM

## 2016-05-03 DIAGNOSIS — Z125 Encounter for screening for malignant neoplasm of prostate: Secondary | ICD-10-CM

## 2016-05-03 DIAGNOSIS — E1122 Type 2 diabetes mellitus with diabetic chronic kidney disease: Secondary | ICD-10-CM | POA: Diagnosis not present

## 2016-05-03 DIAGNOSIS — Z79899 Other long term (current) drug therapy: Secondary | ICD-10-CM

## 2016-05-03 DIAGNOSIS — M419 Scoliosis, unspecified: Secondary | ICD-10-CM

## 2016-05-03 DIAGNOSIS — Z111 Encounter for screening for respiratory tuberculosis: Secondary | ICD-10-CM

## 2016-05-03 DIAGNOSIS — N182 Chronic kidney disease, stage 2 (mild): Secondary | ICD-10-CM

## 2016-05-03 DIAGNOSIS — J984 Other disorders of lung: Secondary | ICD-10-CM

## 2016-05-03 DIAGNOSIS — I714 Abdominal aortic aneurysm, without rupture, unspecified: Secondary | ICD-10-CM

## 2016-05-03 DIAGNOSIS — E559 Vitamin D deficiency, unspecified: Secondary | ICD-10-CM

## 2016-05-03 DIAGNOSIS — Z23 Encounter for immunization: Secondary | ICD-10-CM | POA: Diagnosis not present

## 2016-05-03 DIAGNOSIS — R972 Elevated prostate specific antigen [PSA]: Secondary | ICD-10-CM

## 2016-05-03 LAB — CBC WITH DIFFERENTIAL/PLATELET
BASOS ABS: 0 {cells}/uL (ref 0–200)
Basophils Relative: 0 %
EOS ABS: 76 {cells}/uL (ref 15–500)
Eosinophils Relative: 1 %
HEMATOCRIT: 38.8 % (ref 38.5–50.0)
HEMOGLOBIN: 12.7 g/dL — AB (ref 13.2–17.1)
LYMPHS ABS: 2736 {cells}/uL (ref 850–3900)
LYMPHS PCT: 36 %
MCH: 31.8 pg (ref 27.0–33.0)
MCHC: 32.7 g/dL (ref 32.0–36.0)
MCV: 97 fL (ref 80.0–100.0)
MONO ABS: 608 {cells}/uL (ref 200–950)
MPV: 10.4 fL (ref 7.5–12.5)
Monocytes Relative: 8 %
NEUTROS PCT: 55 %
Neutro Abs: 4180 cells/uL (ref 1500–7800)
Platelets: 179 10*3/uL (ref 140–400)
RBC: 4 MIL/uL — ABNORMAL LOW (ref 4.20–5.80)
RDW: 14.3 % (ref 11.0–15.0)
WBC: 7.6 10*3/uL (ref 3.8–10.8)

## 2016-05-03 LAB — BASIC METABOLIC PANEL WITH GFR
BUN: 34 mg/dL — AB (ref 7–25)
CALCIUM: 9.6 mg/dL (ref 8.6–10.3)
CO2: 35 mmol/L — AB (ref 20–31)
Chloride: 98 mmol/L (ref 98–110)
Creat: 0.89 mg/dL (ref 0.70–1.11)
GFR, EST NON AFRICAN AMERICAN: 79 mL/min (ref >=60)
GLUCOSE: 124 mg/dL — AB (ref 65–99)
Potassium: 4.4 mmol/L (ref 3.5–5.3)
Sodium: 143 mmol/L (ref 135–146)

## 2016-05-03 LAB — LIPID PANEL
CHOL/HDL RATIO: 3 ratio (ref <=5.0)
Cholesterol: 175 mg/dL (ref 125–200)
HDL: 59 mg/dL (ref >=40)
LDL Cholesterol: 99 mg/dL (ref <130)
TRIGLYCERIDES: 83 mg/dL (ref <150)
VLDL: 17 mg/dL (ref <30)

## 2016-05-03 LAB — HEPATIC FUNCTION PANEL
ALK PHOS: 55 U/L (ref 40–115)
ALT: 14 U/L (ref 9–46)
AST: 19 U/L (ref 10–35)
Albumin: 3.9 g/dL (ref 3.6–5.1)
BILIRUBIN DIRECT: 0.2 mg/dL (ref <=0.2)
BILIRUBIN TOTAL: 0.7 mg/dL (ref 0.2–1.2)
Indirect Bilirubin: 0.5 mg/dL (ref 0.2–1.2)
Total Protein: 6.6 g/dL (ref 6.1–8.1)

## 2016-05-03 LAB — MAGNESIUM: MAGNESIUM: 1.9 mg/dL (ref 1.5–2.5)

## 2016-05-03 LAB — TSH: TSH: 0.57 m[IU]/L (ref 0.40–4.50)

## 2016-05-03 NOTE — Patient Instructions (Signed)

## 2016-05-03 NOTE — Progress Notes (Signed)
[place] [facility]   Unk Pinto, M.D.    Uvaldo Bristle. Silverio Lay, P.A.-C      Starlyn Skeans, P.A.-C  [facility]                [address] [place] [id number] Telephone [phone] Telefax [phone]  Comprehensive Evaluation & Examination     This very nice 82y.o.MWM presents for a  comprehensive evaluation and management of multiple medical co-morbidities.  Patient has been followed for HTN, Prediabetes, Hyperlipidemia and Vitamin D Deficiency. Patient also has severe Restrictive Lung Disease due to his severe thoracolumbar rotoscoliosis. He is on home O2 and reports off his nasal oxygen that his O2 sat is ~96, but with walking short distances thru the house and to the mailbox, he  desat's to about 86-88%.      HTN predates since 2004. Patient's BP has been controlled at home.Today's BP is 100/58. Patient denies any cardiac symptoms as chest pain, palpitations, shortness of breath, dizziness or ankle swelling.     Patient's hyperlipidemia is controlled with diet and medications. Patient denies myalgias or other medication SE's. Last lipids were at goal: Lab Results  Component Value Date   CHOL 175 05/03/2016   HDL 59 05/03/2016   LDLCALC 99 05/03/2016   TRIG 83 05/03/2016   CHOLHDL 3.0 05/03/2016      Patient has T2_NIDDM w/CKD2 since 2012 with A1c 5.8% and patient denies reactive hypoglycemic symptoms, visual blurring, diabetic polys or paresthesias. Currently he is attempting dietary mgmt.   Last A1c was not at goal: Lab Results  Component Value Date   HGBA1C 6.2 (H) 10/21/2015       Finally, patient has history of Vitamin D Deficiency of "35" in 2012 and last vitamin D was at goal: Lab Results  Component Value Date   VD25OH 59 04/12/2015   Current Outpatient Prescriptions on File Prior to Visit  Medication Sig  . Albuterol Sulfate (PROAIR RESPICLICK) 123XX123 (90 BASE) MCG/ACT AEPB Inhale 2  Act into the lungs every 6 (six) hours as needed (for shortness of breath).  . Ascorbic Acid (VITAMIN C) 100 MG tablet Take 100 mg by mouth daily.  Marland Kitchen aspirin 81 MG tablet Take 81 mg by mouth daily.  . Cholecalciferol (VITAMIN D PO) Take 8,000 Int'l Units by mouth daily.   . fluticasone (FLONASE) 50 MCG/ACT nasal spray Place 2 sprays into both nostrils daily.  . furosemide (LASIX) 40 MG tablet Take 1 tablet (40 mg total) by mouth daily as needed for fluid or edema.  . Multiple Vitamin (MULTI VITAMIN DAILY PO) Take 1 tablet by mouth daily.   . Omega-3 Fatty Acids (FISH OIL PO) Take 1 capsule by mouth daily.   . potassium chloride SA (K-DUR,KLOR-CON) 20 MEQ tablet TAKE 2 TABLETS BY MOUTH DAILY.  . pravastatin (PRAVACHOL) 40 MG tablet TAKE 1 TABLET AT BEDTIME FOR CHOLESTEROL   No current facility-administered medications on file prior to visit.    Allergies  Allergen Reactions  . Lipitor [Atorvastatin] Other (See Comments)    Severe NIV  . Niaspan [Niacin Er] Itching   Past Medical History:  Diagnosis Date  . Cancer [facility])    prostate  . Hyperlipidemia   . Hypertension   . Pre-diabetes   . Vitamin D deficiency    Health Maintenance  Topic Date Due  . OPHTHALMOLOGY EXAM  08/28/1939  .  INFLUENZA VACCINE  04/03/2016  . URINE MICROALBUMIN  04/11/2016  . HEMOGLOBIN A1C  04/19/2016  . TETANUS/TDAP  06/20/2016  . FOOT EXAM  10/20/2016  . ZOSTAVAX  Completed  . PNA vac Low Risk Adult  Completed   Immunization History  Administered Date(s) Administered  . DTaP 09/03/2005  . Influenza Whole 05/20/2013  . Influenza, High Dose Seasonal PF 06/04/2014, 06/09/2015, 05/03/2016  . Pneumococcal Conjugate-13 10/21/2015  . Pneumococcal Polysaccharide-23 02/05/2012  . Zoster 09/25/2005   Past Surgical History:  Procedure Laterality Date  . PROSTATECTOMY  2001   Family History  Problem Relation Age of Onset  . Heart disease Mother   . COPD Mother   . Cancer Father     colon  .  Alzheimer's disease Father   . Heart disease Father   . Cancer Brother     stomach  . Stroke Maternal Grandfather   . Diabetes Maternal Grandfather     Social History   Social History  . Marital status: Married    Spouse name: N/A  . Number of children: N/A  . Years of education: N/A   Occupational History  . Retired Administrator, [employer]    Social History Main Topics  . Smoking status: Never Smoker  . Smokeless tobacco: Not on file  . Alcohol use No  . Drug use: No  . Sexual activity: No active    ROS Constitutional: Denies fever, chills, weight loss/gain, headaches, insomnia,  night sweats or change in appetite. Does c/o fatigue. Eyes: Denies redness, blurred vision, diplopia, discharge, itchy or watery eyes.  ENT: Denies discharge, congestion, post nasal drip, epistaxis, sore throat, earache, hearing loss, dental pain, Tinnitus, Vertigo, Sinus pain or snoring.  Cardio: Denies chest pain, palpitations, irregular heartbeat, syncope, dyspnea, diaphoresis, orthopnea, PND, claudication or edema Respiratory: denies cough, dyspnea, DOE, pleurisy, hoarseness, laryngitis or wheezing.  Gastrointestinal: Denies dysphagia, heartburn, reflux, water brash, pain, cramps, nausea, vomiting, bloating, diarrhea, constipation, hematemesis, melena, hematochezia, jaundice or hemorrhoids Genitourinary: Denies dysuria, frequency, urgency, nocturia, hesitancy, discharge, hematuria or flank pain Musculoskeletal: Denies arthralgia, myalgia, stiffness, Jt. Swelling, pain, limp or strain/sprain. Denies Falls. Skin: Denies puritis, rash, hives, warts, acne, eczema or change in skin lesion Neuro: No weakness, tremor, incoordination, spasms, paresthesia or pain Psychiatric: Denies confusion, memory loss or sensory loss. Denies Depression. Endocrine: Denies change in weight, skin, hair change, nocturia, and paresthesia, diabetic polys, visual blurring or hyper / hypo glycemic episodes.  Heme/Lymph: No  excessive bleeding, bruising or enlarged lymph nodes.  Physical Exam  BP (!) 100/58   Pulse 68   Temp 97.5 F (36.4 C)   Resp 16   Ht 5\' 4"  (1.626 m)   Wt 138 lb 12.8 oz (63 kg)   BMI 23.82 kg/m   General Appearance: Well nourished, in no apparent distress.  Eyes: PERRLA, EOMs, conjunctiva no swelling or erythema, normal fundi and vessels. Sinuses: No frontal/maxillary tenderness ENT/Mouth: EACs patent / TMs  nl. Nares clear without erythema, swelling, mucoid exudates. Oral hygiene is good. No erythema, swelling, or exudate. Tongue normal, non-obstructing. Tonsils not swollen or erythematous. Hearing normal.  Neck: Supple, thyroid normal. No bruits, nodes or JVD. Respiratory: Respiratory effort normal.  BS equal and clear bilateral without rales, rhonci, wheezing or stridor. Cardio: Heart sounds are normal with regular rate and rhythm and no murmurs, rubs or gallops. Peripheral pulses are normal and equal bilaterally without edema. No aortic or femoral bruits. Chest: Severe thoracolumbar rotoscoliosis with moderatey severe chest deformity.  Abdomen: Soft,  protuberant with Nl bowel sounds. Nontender, no guarding, rebound, hernias, masses, or organomegaly.  Lymphatics: Non tender without lymphadenopathy.  Genitourinary: No hernias.Testes nl. DRE - deferred for age. Musculoskeletal: Full ROM all peripheral extremities, joint stability, 5/5 strength, and normal gait. Skin: Warm and dry without rashes, lesions, cyanosis, clubbing or  ecchymosis.  Neuro: Cranial nerves intact, reflexes equal bilaterally. Normal muscle tone, no cerebellar symptoms. Sensation intact to touch, Vibratory and Monofilament to the toes bilaterally. Marland Kitchen  Pysch: Alert and oriented X 3 with normal affect, insight and judgment appropriate.   Assessment and Plan  1. Essential hypertension  - Microalbumin / creatinine urine ratio - EKG 12-Lead - TSH  2. Hyperlipidemia  - EKG 12-Lead - Lipid panel - TSH  3.  CKD stage 2 due to type 2 diabetes mellitus (HCC)  - EKG 12-Lead - HM DIABETES FOOT EXAM - LOW EXTREMITY NEUR EXAM DOCUM - Hemoglobin A1c - Insulin, random  4. Vitamin D deficiency  - VITAMIN D 25 Hydroxy   5. Restrictive lung disease due to kyphoscoliosis   6. Screening for rectal cancer  - POC Hemoccult Bld/Stl   7. Prostate cancer screening   8. Elevated PSA   9. Screening for ischemic heart disease  - EKG 12-Lead  10. AAA (abdominal aortic aneurysm) without rupture (HCC)  - [place], RETROPERITNL ABD,  LTD  11. Medication management  - Urinalysis, Routine w reflex microscopic (not at [facility]) - CBC with Differential/Platelet - BASIC METABOLIC PANEL WITH GFR - Hepatic function panel - Magnesium  12. Need for prophylactic vaccination and inoculation against influenza  - Flu vaccine HIGH DOSE PF (Fluzone High dose)      Continue prudent diet as discussed, weight control, BP monitoring, regular exercise, and medications as discussed.  Discussed med effects and SE's. Routine screening labs and tests as requested with regular follow-up as recommended. Over 40 minutes of exam, counseling, chart review and high complex critical decision making was performed

## 2016-05-04 LAB — URINALYSIS, ROUTINE W REFLEX MICROSCOPIC
BILIRUBIN URINE: NEGATIVE
GLUCOSE, UA: NEGATIVE
HGB URINE DIPSTICK: NEGATIVE
Ketones, ur: NEGATIVE
LEUKOCYTES UA: NEGATIVE
Nitrite: NEGATIVE
PROTEIN: NEGATIVE
Specific Gravity, Urine: 1.023 (ref 1.001–1.035)
pH: 6 (ref 5.0–8.0)

## 2016-05-04 LAB — INSULIN, RANDOM: Insulin: 34.8 u[IU]/mL — ABNORMAL HIGH (ref 2.0–19.6)

## 2016-05-04 LAB — MICROALBUMIN / CREATININE URINE RATIO
Creatinine, Urine: 155 mg/dL (ref 20–370)
MICROALB UR: 0.7 mg/dL
MICROALB/CREAT RATIO: 5 ug/mg{creat} (ref <30)

## 2016-05-04 LAB — HEMOGLOBIN A1C
Hgb A1c MFr Bld: 5.3 % (ref <5.7)
Mean Plasma Glucose: 105 mg/dL

## 2016-05-04 LAB — VITAMIN D 25 HYDROXY (VIT D DEFICIENCY, FRACTURES): VIT D 25 HYDROXY: 80 ng/mL (ref 30–100)

## 2016-05-11 DIAGNOSIS — L570 Actinic keratosis: Secondary | ICD-10-CM | POA: Diagnosis not present

## 2016-05-11 DIAGNOSIS — Z85828 Personal history of other malignant neoplasm of skin: Secondary | ICD-10-CM | POA: Diagnosis not present

## 2016-05-11 DIAGNOSIS — C4442 Squamous cell carcinoma of skin of scalp and neck: Secondary | ICD-10-CM | POA: Diagnosis not present

## 2016-05-11 DIAGNOSIS — C44529 Squamous cell carcinoma of skin of other part of trunk: Secondary | ICD-10-CM | POA: Diagnosis not present

## 2016-05-11 DIAGNOSIS — C44311 Basal cell carcinoma of skin of nose: Secondary | ICD-10-CM | POA: Diagnosis not present

## 2016-05-23 ENCOUNTER — Encounter: Payer: Self-pay | Admitting: Internal Medicine

## 2016-05-28 DIAGNOSIS — Z85828 Personal history of other malignant neoplasm of skin: Secondary | ICD-10-CM | POA: Diagnosis not present

## 2016-05-28 DIAGNOSIS — C44311 Basal cell carcinoma of skin of nose: Secondary | ICD-10-CM | POA: Diagnosis not present

## 2016-06-06 ENCOUNTER — Other Ambulatory Visit: Payer: Self-pay | Admitting: Physician Assistant

## 2016-06-14 ENCOUNTER — Other Ambulatory Visit: Payer: Self-pay | Admitting: *Deleted

## 2016-06-14 DIAGNOSIS — Z1212 Encounter for screening for malignant neoplasm of rectum: Secondary | ICD-10-CM

## 2016-06-14 LAB — POC HEMOCCULT BLD/STL (HOME/3-CARD/SCREEN)
Card #3 Fecal Occult Blood, POC: NEGATIVE
FECAL OCCULT BLD: NEGATIVE
FECAL OCCULT BLD: NEGATIVE

## 2016-08-09 ENCOUNTER — Ambulatory Visit (INDEPENDENT_AMBULATORY_CARE_PROVIDER_SITE_OTHER): Payer: Medicare Other | Admitting: Internal Medicine

## 2016-08-09 ENCOUNTER — Encounter: Payer: Self-pay | Admitting: Internal Medicine

## 2016-08-09 VITALS — BP 116/58 | HR 60 | Temp 98.2°F | Resp 18 | Ht 64.0 in | Wt 136.0 lb

## 2016-08-09 DIAGNOSIS — I10 Essential (primary) hypertension: Secondary | ICD-10-CM

## 2016-08-09 DIAGNOSIS — E559 Vitamin D deficiency, unspecified: Secondary | ICD-10-CM

## 2016-08-09 DIAGNOSIS — E782 Mixed hyperlipidemia: Secondary | ICD-10-CM

## 2016-08-09 DIAGNOSIS — J984 Other disorders of lung: Secondary | ICD-10-CM

## 2016-08-09 DIAGNOSIS — M419 Scoliosis, unspecified: Secondary | ICD-10-CM | POA: Diagnosis not present

## 2016-08-09 DIAGNOSIS — Z87898 Personal history of other specified conditions: Secondary | ICD-10-CM | POA: Diagnosis not present

## 2016-08-09 DIAGNOSIS — Z79899 Other long term (current) drug therapy: Secondary | ICD-10-CM | POA: Diagnosis not present

## 2016-08-09 NOTE — Progress Notes (Signed)
Assessment and Plan:  Hypertension:  -Continue medication,  -monitor blood pressure at home.  -Continue DASH diet.   -Reminder to go to the ER if any CP, SOB, nausea, dizziness, severe HA, changes vision/speech, left arm numbness and tingling, and jaw pain.  Cholesterol: -Continue diet and exercise.   Pre-diabetes: -Continue diet and exercise.   Vitamin D Def: -continue medications.   Insomnia -belsomra samples were given at 10, 15, 20 mg -see if this helps -given restrictive lung disease and sedating medications recommended increased compliance with nighttime oxygen use  Restrictive lung disease -stable -cont albuterol prn -cont nighttime O2   Continue diet and meds as discussed. Further disposition pending results of labs.  HPI 82 y.o. male  presents for 3 month follow up with hypertension, hyperlipidemia, prediabetes and vitamin D.   His blood pressure has been controlled at home, today their BP is BP: (!) 116/58.   He does not workout. He denies chest pain, shortness of breath, dizziness.   He is on cholesterol medication and denies myalgias. His cholesterol is at goal. The cholesterol last visit was:   Lab Results  Component Value Date   CHOL 175 05/03/2016   HDL 59 05/03/2016   LDLCALC 99 05/03/2016   TRIG 83 05/03/2016   CHOLHDL 3.0 05/03/2016     He has been working on diet and exercise for prediabetes, and denies foot ulcerations, hyperglycemia, hypoglycemia , increased appetite, nausea, paresthesia of the feet, polydipsia, polyuria, visual disturbances, vomiting and weight loss. Last A1C in the office was:  Lab Results  Component Value Date   HGBA1C 5.3 05/03/2016    Patient is on Vitamin D supplement.  Lab Results  Component Value Date   VD25OH 41 05/03/2016     He reports that he has been having a hard time with taking care of his wife.  He reports that she has been very sore and tired and has not been doing well. He reports that he has been doing  his best to get her well, but he can't really manage to make her feel better which is hard for him.   He reports that his shortness of breath is very stable, but he is otherwise doing well.  He reports that he does have some trouble with sleeping.  He has difficulty staying asleep.  He falls asleep easily.  He reports that he will go back to sleep eventually but it takes him a long time.  He does use oxygen at nighttime.  He reports that he does try to use something nightly.   His back is doing well.  No current pain.      Current Medications:  Current Outpatient Prescriptions on File Prior to Visit  Medication Sig Dispense Refill  . Albuterol Sulfate (PROAIR RESPICLICK) 123XX123 (90 BASE) MCG/ACT AEPB Inhale 2 Act into the lungs every 6 (six) hours as needed (for shortness of breath). 1 each 0  . Ascorbic Acid (VITAMIN C) 100 MG tablet Take 100 mg by mouth daily.    Marland Kitchen aspirin 81 MG tablet Take 81 mg by mouth daily.    . Cholecalciferol (VITAMIN D PO) Take 8,000 Int'l Units by mouth daily.     . furosemide (LASIX) 40 MG tablet Take 1 tablet (40 mg total) by mouth daily as needed for fluid or edema. 30 tablet 3  . Multiple Vitamin (MULTI VITAMIN DAILY PO) Take 1 tablet by mouth daily.     . Omega-3 Fatty Acids (FISH OIL PO) Take 1  capsule by mouth daily.     . potassium chloride SA (K-DUR,KLOR-CON) 20 MEQ tablet TAKE 2 TABLETS BY MOUTH DAILY. 180 tablet 1  . pravastatin (PRAVACHOL) 40 MG tablet TAKE 1 TABLET AT BEDTIME FOR CHOLESTEROL 90 tablet 1   No current facility-administered medications on file prior to visit.     Medical History:  Past Medical History:  Diagnosis Date  . Cancer [facility])    prostate  . Hyperlipidemia   . Hypertension   . Pre-diabetes   . Vitamin D deficiency     Allergies:  Allergies  Allergen Reactions  . Lipitor [Atorvastatin] Other (See Comments)    Severe NIV  . Niaspan [Niacin Er] Itching     Review of Systems:  Review of Systems  Constitutional:  Negative for chills, fever and malaise/fatigue.  HENT: Negative for congestion, ear pain and sore throat.   Eyes: Negative.   Respiratory: Negative for cough, shortness of breath and wheezing.   Cardiovascular: Negative for chest pain, palpitations and leg swelling.  Gastrointestinal: Negative for abdominal pain, blood in stool, constipation, diarrhea, heartburn and melena.  Genitourinary: Negative.   Skin: Negative.   Neurological: Negative for dizziness, sensory change, loss of consciousness and headaches.  Psychiatric/Behavioral: Negative for depression. The patient is not nervous/anxious and does not have insomnia.     Family history- Review and unchanged  Social history- Review and unchanged  Physical Exam: BP (!) 116/58   Pulse 60   Temp 98.2 F (36.8 C) (Temporal)   Resp 18   Ht 5\' 4"  (1.626 m)   Wt 136 lb (61.7 kg)   BMI 23.34 kg/m  Wt Readings from Last 3 Encounters:  08/09/16 136 lb (61.7 kg)  05/03/16 138 lb 12.8 oz (63 kg)  03/14/16 138 lb (62.6 kg)    General Appearance: Well nourished well developed, in no apparent distress. Eyes: PERRLA, EOMs, conjunctiva no swelling or erythema ENT/Mouth: Ear canals normal without obstruction, swelling, erythma, discharge.  TMs normal bilaterally.  Oropharynx moist, clear, without exudate, or postoropharyngeal swelling. Neck: Supple, thyroid normal,no cervical adenopathy  Respiratory: Respiratory effort normal, Breath sounds clear A&P without rhonchi, or rale.  No retractions, no accessory usage.  Forced end expiratory wheeze present. Cardio: RRR with no MRGs. Brisk peripheral pulses without edema.  Abdomen: Soft, + BS,  Non tender, no guarding, rebound, hernias, masses. Musculoskeletal: Severe kyphoscoliosis, Full ROM, 5/5 strength, Normal gait Skin: Warm, dry without rashes, lesions, ecchymosis.  Neuro: Awake and oriented X 3, Cranial nerves intact. Normal muscle tone, no cerebellar symptoms. Psych: Normal affect, Insight  and Judgment appropriate.    Starlyn Skeans, PA-C 9:56 AM [facility]

## 2016-08-21 DIAGNOSIS — D225 Melanocytic nevi of trunk: Secondary | ICD-10-CM | POA: Diagnosis not present

## 2016-08-21 DIAGNOSIS — C44519 Basal cell carcinoma of skin of other part of trunk: Secondary | ICD-10-CM | POA: Diagnosis not present

## 2016-08-21 DIAGNOSIS — D2262 Melanocytic nevi of left upper limb, including shoulder: Secondary | ICD-10-CM | POA: Diagnosis not present

## 2016-08-21 DIAGNOSIS — C44319 Basal cell carcinoma of skin of other parts of face: Secondary | ICD-10-CM | POA: Diagnosis not present

## 2016-08-21 DIAGNOSIS — D224 Melanocytic nevi of scalp and neck: Secondary | ICD-10-CM | POA: Diagnosis not present

## 2016-08-21 DIAGNOSIS — L570 Actinic keratosis: Secondary | ICD-10-CM | POA: Diagnosis not present

## 2016-08-21 DIAGNOSIS — D692 Other nonthrombocytopenic purpura: Secondary | ICD-10-CM | POA: Diagnosis not present

## 2016-08-21 DIAGNOSIS — L821 Other seborrheic keratosis: Secondary | ICD-10-CM | POA: Diagnosis not present

## 2016-08-21 DIAGNOSIS — D487 Neoplasm of uncertain behavior of other specified sites: Secondary | ICD-10-CM | POA: Diagnosis not present

## 2016-08-21 DIAGNOSIS — L814 Other melanin hyperpigmentation: Secondary | ICD-10-CM | POA: Diagnosis not present

## 2016-08-21 DIAGNOSIS — D0439 Carcinoma in situ of skin of other parts of face: Secondary | ICD-10-CM | POA: Diagnosis not present

## 2016-08-21 DIAGNOSIS — Z85828 Personal history of other malignant neoplasm of skin: Secondary | ICD-10-CM | POA: Diagnosis not present

## 2016-08-21 DIAGNOSIS — D1801 Hemangioma of skin and subcutaneous tissue: Secondary | ICD-10-CM | POA: Diagnosis not present

## 2016-10-18 ENCOUNTER — Ambulatory Visit (INDEPENDENT_AMBULATORY_CARE_PROVIDER_SITE_OTHER): Payer: Medicare Other | Admitting: Internal Medicine

## 2016-10-18 ENCOUNTER — Encounter: Payer: Self-pay | Admitting: Internal Medicine

## 2016-10-18 VITALS — BP 100/76 | HR 70 | Temp 98.2°F | Resp 14 | Ht 64.0 in | Wt 140.2 lb

## 2016-10-18 DIAGNOSIS — K4090 Unilateral inguinal hernia, without obstruction or gangrene, not specified as recurrent: Secondary | ICD-10-CM

## 2016-10-18 DIAGNOSIS — M419 Scoliosis, unspecified: Secondary | ICD-10-CM | POA: Diagnosis not present

## 2016-10-18 DIAGNOSIS — J984 Other disorders of lung: Secondary | ICD-10-CM

## 2016-10-18 MED ORDER — PRAVASTATIN SODIUM 40 MG PO TABS: 40.0000 mg | ORAL_TABLET | Freq: Every day | ORAL | 3 refills | Status: DC

## 2016-10-18 NOTE — Progress Notes (Signed)
Assessment and Plan:   1. Restrictive lung disease due to kyphoscoliosis -O2 dropped to 85% on extertion -will recertify O2 with home health  2. Right inguinal hernia -watchful waiting -patient instructed to go to the ER if pain, blood in stool, or any other concerning symptoms -not good candidate for surgery.    HPI 83 y.o.male presents for follow-up of O2 use.  He reports that he has been using oxygen when he sits and reads.  He no longer wears it over night as well.  He reports that he has not had any worsening shortness of breath recently. He reports that his breathing is about at his baseline.  He does not use his O2 all the time.  He has recently been doing well.  He does still have to do some exertional activity like lifting his wife when she falls due to her chronic ataxia.  He also notes he has had some right groin pain in the last week.  It is a dull aching pain.  He thinks that this happened when he lifted his wife in the parking lot.  He is afraid it is a hernia.  No other abdominal pain.  No changes in urinating or bowel movements.  NO blood in stool or black colored stools.       Past Medical History:  Diagnosis Date  . Cancer [facility])    prostate  . Hyperlipidemia   . Hypertension   . Pre-diabetes   . Vitamin D deficiency      Allergies  Allergen Reactions  . Lipitor [Atorvastatin] Other (See Comments)    Severe NIV  . Niaspan [Niacin Er] Itching      Current Outpatient Prescriptions on File Prior to Visit  Medication Sig Dispense Refill  . Albuterol Sulfate (PROAIR RESPICLICK) 123XX123 (90 BASE) MCG/ACT AEPB Inhale 2 Act into the lungs every 6 (six) hours as needed (for shortness of breath). 1 each 0  . Ascorbic Acid (VITAMIN C) 100 MG tablet Take 100 mg by mouth daily.    Marland Kitchen aspirin 81 MG tablet Take 81 mg by mouth daily.    . Cholecalciferol (VITAMIN D PO) Take 8,000 Int'l Units by mouth daily.     . furosemide (LASIX) 40 MG tablet Take 1 tablet (40 mg total) by  mouth daily as needed for fluid or edema. 30 tablet 3  . Multiple Vitamin (MULTI VITAMIN DAILY PO) Take 1 tablet by mouth daily.     . Omega-3 Fatty Acids (FISH OIL PO) Take 1 capsule by mouth daily.     . potassium chloride SA (K-DUR,KLOR-CON) 20 MEQ tablet TAKE 2 TABLETS BY MOUTH DAILY. 180 tablet 1  . pravastatin (PRAVACHOL) 40 MG tablet TAKE 1 TABLET AT BEDTIME FOR CHOLESTEROL 90 tablet 1   No current facility-administered medications on file prior to visit.    SATURATION QUALIFICATIONS: (This note is used to comply with regulatory documentation for home oxygen)  Patient Saturations on Room Air at Rest =  96%  Patient Saturations on Room Air while Ambulating = 85%,   Patient Saturations on 2 Liters of oxygen while Ambulating = 94%  Please briefly explain why patient needs home oxygen:  Patient has had some dyspnea on exertion since his release from the hospital in 09/10/15.  He does have chronic restrictive lung disease secondary to scoliosis which has been worsening with age.  He does drop to 85% on room air with walking less than 100 ft.  Patient improved to >92% on 2Lliters.  ROS: all negative except above.   Physical Exam: Filed Weights   10/18/16 1457  Weight: 140 lb 3.2 oz (63.6 kg)   BP 100/76   Pulse 70   Temp 98.2 F (36.8 C)   Resp 14   Ht 5\' 4"  (1.626 m)   Wt 140 lb 3.2 oz (63.6 kg)   SpO2 96%   BMI 24.07 kg/m  General Appearance: Well developed well nourished, non-toxic appearing in no apparent distress. Eyes: PERRLA, EOMs, conjunctiva w/ no swelling or erythema or discharge Sinuses: No Frontal/maxillary tenderness ENT/Mouth: Ear canals clear without swelling or erythema.  TM's normal bilaterally with no retractions, bulging, or loss of landmarks.   Neck: Supple, thyroid normal, no notable JVD  Respiratory: Respiratory effort normal, Clear breath sounds anteriorly and posteriorly bilaterally without rales, rhonchi, wheezing or stridor. No retractions or  accessory muscle usage. Cardio: RRR with no MRGs.   Abdomen: Soft, + BS.  Non tender, no guarding, rebound, masses. Small right inguinal hernia.  Non-tender to palpation.   Musculoskeletal: Severe kyphoscoliosis, Full ROM, 5/5 strength, normal gait.  Skin: Warm, dry without rashes  Neuro: Awake and oriented X 3, Cranial nerves intact. Normal muscle tone, no cerebellar symptoms. Sensation intact.  Psych: normal affect, Insight and Judgment appropriate.     Starlyn Skeans, PA-C 3:04 PM [facility]

## 2016-11-14 ENCOUNTER — Encounter: Payer: Self-pay | Admitting: Internal Medicine

## 2016-11-14 ENCOUNTER — Ambulatory Visit (INDEPENDENT_AMBULATORY_CARE_PROVIDER_SITE_OTHER): Payer: Medicare Other | Admitting: Internal Medicine

## 2016-11-14 VITALS — BP 120/56 | HR 64 | Temp 97.3°F | Resp 16 | Ht 64.0 in | Wt 136.2 lb

## 2016-11-14 DIAGNOSIS — Z79899 Other long term (current) drug therapy: Secondary | ICD-10-CM | POA: Diagnosis not present

## 2016-11-14 DIAGNOSIS — M419 Scoliosis, unspecified: Secondary | ICD-10-CM | POA: Diagnosis not present

## 2016-11-14 DIAGNOSIS — J984 Other disorders of lung: Secondary | ICD-10-CM

## 2016-11-14 DIAGNOSIS — E559 Vitamin D deficiency, unspecified: Secondary | ICD-10-CM

## 2016-11-14 DIAGNOSIS — R7303 Prediabetes: Secondary | ICD-10-CM | POA: Diagnosis not present

## 2016-11-14 DIAGNOSIS — I10 Essential (primary) hypertension: Secondary | ICD-10-CM

## 2016-11-14 DIAGNOSIS — E782 Mixed hyperlipidemia: Secondary | ICD-10-CM | POA: Diagnosis not present

## 2016-11-14 LAB — CBC WITH DIFFERENTIAL/PLATELET
BASOS ABS: 0 {cells}/uL (ref 0–200)
Basophils Relative: 0 %
EOS ABS: 0 {cells}/uL — AB (ref 15–500)
Eosinophils Relative: 0 %
HCT: 45.2 % (ref 38.5–50.0)
Hemoglobin: 15 g/dL (ref 13.2–17.1)
LYMPHS PCT: 39 %
Lymphs Abs: 2925 cells/uL (ref 850–3900)
MCH: 32.4 pg (ref 27.0–33.0)
MCHC: 33.2 g/dL (ref 32.0–36.0)
MCV: 97.6 fL (ref 80.0–100.0)
MONOS PCT: 8 %
MPV: 10 fL (ref 7.5–12.5)
Monocytes Absolute: 600 cells/uL (ref 200–950)
Neutro Abs: 3975 cells/uL (ref 1500–7800)
Neutrophils Relative %: 53 %
PLATELETS: 158 10*3/uL (ref 140–400)
RBC: 4.63 MIL/uL (ref 4.20–5.80)
RDW: 13.2 % (ref 11.0–15.0)
WBC: 7.5 10*3/uL (ref 3.8–10.8)

## 2016-11-14 LAB — LIPID PANEL
Cholesterol: 170 mg/dL (ref <200)
HDL: 54 mg/dL (ref >40)
LDL CALC: 101 mg/dL — AB (ref <100)
TRIGLYCERIDES: 75 mg/dL (ref <150)
Total CHOL/HDL Ratio: 3.1 Ratio (ref <5.0)
VLDL: 15 mg/dL (ref <30)

## 2016-11-14 LAB — MAGNESIUM: Magnesium: 2 mg/dL (ref 1.5–2.5)

## 2016-11-14 LAB — BASIC METABOLIC PANEL WITH GFR
BUN: 30 mg/dL — ABNORMAL HIGH (ref 7–25)
CO2: 34 mmol/L — ABNORMAL HIGH (ref 20–31)
Calcium: 9.1 mg/dL (ref 8.6–10.3)
Chloride: 101 mmol/L (ref 98–110)
Creat: 0.99 mg/dL (ref 0.70–1.11)
GFR, EST AFRICAN AMERICAN: 80 mL/min (ref >=60)
GFR, Est Non African American: 69 mL/min (ref >=60)
Glucose, Bld: 96 mg/dL (ref 65–99)
Potassium: 3.9 mmol/L (ref 3.5–5.3)
Sodium: 142 mmol/L (ref 135–146)

## 2016-11-14 LAB — HEPATIC FUNCTION PANEL
ALK PHOS: 64 U/L (ref 40–115)
ALT: 22 U/L (ref 9–46)
AST: 27 U/L (ref 10–35)
Albumin: 4.3 g/dL (ref 3.6–5.1)
BILIRUBIN INDIRECT: 0.5 mg/dL (ref 0.2–1.2)
Bilirubin, Direct: 0.2 mg/dL (ref <=0.2)
TOTAL PROTEIN: 7 g/dL (ref 6.1–8.1)
Total Bilirubin: 0.7 mg/dL (ref 0.2–1.2)

## 2016-11-14 LAB — TSH: TSH: 1.05 mIU/L (ref 0.40–4.50)

## 2016-11-14 NOTE — Progress Notes (Signed)
This very nice 83 y.o. MWM presents for 6 month follow up with Hypertension, Hyperlipidemia, Pre-Diabetes and Vitamin D Deficiency. 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 He was last hospitalize in Jan 2017 with acute hypercapnic respiratory failure. He report Home O2 sats at rest are in the 92-96% range and may drop to the 80's with moderate active as walking to his mailbox. O2 sat was 94-955 at rest and O2 sat dropped to 85% ambulating briskly 50' in the hall and then recovered to his baseline. He uses Home O2 on a prn basis.       Patient is treated for HTN  (2004) & BP has been controlled at home. Today's BP is at goal - 120/56. Patient has had no complaints of any cardiac type chest pain, palpitations, dyspnea/orthopnea/PND, dizziness, claudication, or dependent edema.     Hyperlipidemia is controlled with diet & meds. Patient denies myalgias or other med SE's. Last Lipids were at goal: Lab Results  Component Value Date   CHOL 175 05/03/2016   HDL 59 05/03/2016   LDLCALC 99 05/03/2016   TRIG 83 05/03/2016   CHOLHDL 3.0 05/03/2016      Also, the patient has history of PreDiabetes  (A1c 5.8% in 2012) and has had no symptoms of reactive hypoglycemia, diabetic polys, paresthesias or visual blurring.  Last A1c was at goal: Lab Results  Component Value Date   HGBA1C 5.3 05/03/2016      Further, the patient also has history of Vitamin D Deficiency ("35" in 2008 and 6.0% in Apr 2016) and supplements vitamin D without any suspected side-effects. Last vitamin D was at goal: Lab Results  Component Value Date   VD25OH 54 05/03/2016   Current Outpatient Prescriptions on File Prior to Visit  Medication Sig  . Albuterol Sulfate (PROAIR RESPICLICK) 098 (90 BASE) MCG/ACT AEPB Inhale 2 Act into the lungs every 6 (six) hours as needed (for shortness of breath).  . Ascorbic Acid (VITAMIN C) 100 MG tablet Take 100 mg by  mouth daily.  Marland Kitchen aspirin 81 MG tablet Take 81 mg by mouth daily.  . Cholecalciferol (VITAMIN D PO) Take 8,000 Int'l Units by mouth daily.   . furosemide (LASIX) 40 MG tablet Take 1 tablet (40 mg total) by mouth daily as needed for fluid or edema.  . Multiple Vitamin (MULTI VITAMIN DAILY PO) Take 1 tablet by mouth daily.   . Omega-3 Fatty Acids (FISH OIL PO) Take 1 capsule by mouth daily.   . potassium chloride SA (K-DUR,KLOR-CON) 20 MEQ tablet TAKE 2 TABLETS BY MOUTH DAILY.  . pravastatin (PRAVACHOL) 40 MG tablet Take 1 tablet (40 mg total) by mouth daily.   No current facility-administered medications on file prior to visit.    Allergies  Allergen Reactions  . Lipitor [Atorvastatin] Other (See Comments)    Severe NIV  . Niaspan [Niacin Er] Itching   PMHx:   Past Medical History:  Diagnosis Date  . Cancer [facility])    prostate  . Hyperlipidemia   . Hypertension   . Pre-diabetes   . Vitamin D deficiency    Immunization History  Administered Date(s) Administered  . DTaP 09/03/2005  . Influenza Whole 05/20/2013  . Influenza, High Dose Seasonal PF 06/04/2014, 06/09/2015, 05/03/2016  . Pneumococcal Conjugate-13 10/21/2015  . Pneumococcal Polysaccharide-23 02/05/2012  . Zoster 09/25/2005   Past Surgical History:  Procedure Laterality Date  . PROSTATECTOMY  2001  FHx:    Reviewed / unchanged  SHx:    Reviewed / unchanged  Systems Review:  Constitutional: Denies fever, chills, wt changes, headaches, insomnia, fatigue, night sweats, change in appetite. Eyes: Denies redness, blurred vision, diplopia, discharge, itchy, watery eyes.  ENT: Denies discharge, congestion, post nasal drip, epistaxis, sore throat, earache, hearing loss, dental pain, tinnitus, vertigo, sinus pain, snoring.  CV: Denies chest pain, palpitations, irregular heartbeat, syncope, dyspnea, diaphoresis, orthopnea, PND, claudication or edema. Respiratory: denies cough, dyspnea, DOE, pleurisy, hoarseness, laryngitis,  wheezing.  Gastrointestinal: Denies dysphagia, odynophagia, heartburn, reflux, water brash, abdominal pain or cramps, nausea, vomiting, bloating, diarrhea, constipation, hematemesis, melena, hematochezia  or hemorrhoids. Genitourinary: Denies dysuria, frequency, urgency, nocturia, hesitancy, discharge, hematuria or flank pain. Musculoskeletal: Denies arthralgias, myalgias, stiffness, jt. swelling, pain, limping or strain/sprain.  Skin: Denies pruritus, rash, hives, warts, acne, eczema or change in skin lesion(s). Neuro: No weakness, tremor, incoordination, spasms, paresthesia or pain. Psychiatric: Denies confusion, memory loss or sensory loss. Endo: Denies change in weight, skin or hair change.  Heme/Lymph: No excessive bleeding, bruising or enlarged lymph nodes.  Physical Exam  BP (!) 120/56   Pulse 64   Temp 97.3 F (36.3 C)   Resp 16   Ht 5\' 4"  (1.626 m)   Wt 136 lb 3.2 oz (61.8 kg)   BMI 23.38 kg/m   Appears well nourished and in no distress.  Eyes: PERRLA, EOMs, conjunctiva no swelling or erythema. Sinuses: No frontal/maxillary tenderness ENT/Mouth: EAC's clear, TM's nl w/o erythema, bulging. Nares clear w/o erythema, swelling, exudates. Oropharynx clear without erythema or exudates. Oral hygiene is good. Tongue normal, non obstructing. Hearing intact.  Neck: Supple. Thyroid nl. Car 2+/2+ without bruits, nodes or JVD. Chest: Severe Kypho rotoscoliosis  With a thoracic hump to the upper left of the Chest. Respirations decreased  with BS clear & equal w/o rales, rhonchi, wheezing or stridor.  Cor: Heart sounds soft w/ regular rate and rhythm without sig. murmurs, gallops, clicks, or rubs. Peripheral pulses normal and equal  without edema.  Abdomen: Soft, flat  & bowel sounds normal. Non-tender w/o guarding, rebound, hernias, masses, or organomegaly.  Lymphatics: Unremarkable.  Musculoskeletal: Full ROM all peripheral extremities, joint stability, 5/5 strength, and normal gait.    Skin: Warm, dry without exposed rashes, lesions or ecchymosis apparent.  Neuro: Cranial nerves intact, reflexes equal bilaterally. Sensory-motor testing grossly intact. Tendon reflexes grossly intact.  Pysch: Alert & oriented x 3.  Insight and judgement nl & appropriate. No ideations.  Assessment and Plan:  1. Essential hypertension  - Continue medication, monitor blood pressure at home.  - Continue DASH diet. Reminder to go to the ER if any CP,  SOB, nausea, dizziness, severe HA, changes vision/speech,  left arm numbness and tingling and jaw pain.  - CBC with Differential/Platelet - BASIC METABOLIC PANEL WITH GFR - Magnesium - TSH  2. Hyperlipidemia  - Continue diet/meds, exercise,& lifestyle modifications.  - Continue monitor periodic cholesterol/liver & renal functions    - Hepatic function panel - Lipid panel - TSH  3. Prediabetes  - Continue diet, exercise, lifestyle modifications.  - Monitor appropriate labs.  - Hemoglobin A1c - Insulin, random  4. Vitamin D deficiency  - Continue supplementation.  - VITAMIN D 25 Hydroxy   5. Restrictive lung disease due to kyphoscoliosis   6. Medication management  - CBC with Differential/Platelet - BASIC METABOLIC PANEL WITH GFR - Hepatic function panel - Magnesium - Lipid panel - TSH - Hemoglobin A1c - Insulin,  random - VITAMIN D 25 Hydroxy       Recommended regular exercise, BP monitoring, weight control, and discussed med and SE's. Recommended labs to assess and monitor clinical status. Further disposition pending results of labs. Over 30 minutes of exam, counseling, chart review was performed

## 2016-11-14 NOTE — Patient Instructions (Signed)

## 2016-11-15 LAB — HEMOGLOBIN A1C
Hgb A1c MFr Bld: 5.4 % (ref <5.7)
MEAN PLASMA GLUCOSE: 108 mg/dL

## 2016-11-15 LAB — VITAMIN D 25 HYDROXY (VIT D DEFICIENCY, FRACTURES): Vit D, 25-Hydroxy: 85 ng/mL (ref 30–100)

## 2016-11-15 LAB — INSULIN, RANDOM: INSULIN: 10.6 u[IU]/mL (ref 2.0–19.6)

## 2016-11-19 DIAGNOSIS — L570 Actinic keratosis: Secondary | ICD-10-CM | POA: Diagnosis not present

## 2016-11-19 DIAGNOSIS — L821 Other seborrheic keratosis: Secondary | ICD-10-CM | POA: Diagnosis not present

## 2016-11-19 DIAGNOSIS — D1801 Hemangioma of skin and subcutaneous tissue: Secondary | ICD-10-CM | POA: Diagnosis not present

## 2016-11-19 DIAGNOSIS — Z85828 Personal history of other malignant neoplasm of skin: Secondary | ICD-10-CM | POA: Diagnosis not present

## 2016-11-19 DIAGNOSIS — D485 Neoplasm of uncertain behavior of skin: Secondary | ICD-10-CM | POA: Diagnosis not present

## 2016-11-26 ENCOUNTER — Other Ambulatory Visit: Payer: Self-pay | Admitting: Internal Medicine

## 2017-02-01 DIAGNOSIS — L814 Other melanin hyperpigmentation: Secondary | ICD-10-CM | POA: Diagnosis not present

## 2017-02-01 DIAGNOSIS — L570 Actinic keratosis: Secondary | ICD-10-CM | POA: Diagnosis not present

## 2017-02-01 DIAGNOSIS — D1801 Hemangioma of skin and subcutaneous tissue: Secondary | ICD-10-CM | POA: Diagnosis not present

## 2017-02-01 DIAGNOSIS — Z85828 Personal history of other malignant neoplasm of skin: Secondary | ICD-10-CM | POA: Diagnosis not present

## 2017-02-01 DIAGNOSIS — L821 Other seborrheic keratosis: Secondary | ICD-10-CM | POA: Diagnosis not present

## 2017-02-01 DIAGNOSIS — D225 Melanocytic nevi of trunk: Secondary | ICD-10-CM | POA: Diagnosis not present

## 2017-02-01 DIAGNOSIS — D2262 Melanocytic nevi of left upper limb, including shoulder: Secondary | ICD-10-CM | POA: Diagnosis not present

## 2017-02-22 ENCOUNTER — Ambulatory Visit: Payer: Self-pay | Admitting: Internal Medicine

## 2017-02-28 NOTE — Progress Notes (Signed)
Patient ID: Timothy Vaughn, male   DOB: 04-05-1929, 83 y.o.   MRN: [id number]  MEDICARE ANNUAL WELLNESS VISIT AND 3 MONTH OV  Assessment:   Essential hypertension - continue medications, DASH diet, exercise and monitor at home. Call if greater than 130/80.  -     CBC with Differential/Platelet -     BASIC METABOLIC PANEL WITH GFR -     Hepatic function panel -     TSH  Moderate tricuspid regurgitation Monitor  Restrictive lung disease due to kyphoscoliosis Monitor for infection, continue deep breaths, follows pulmonary  History of prediabetes Monitor weight  Kyphoscoliosis monitor  Hyperlipidemia -continue medications, check lipids, decrease fatty foods, increase activity.  -     Lipid panel  Vitamin D deficiency Continue supplement  Medication management -     Magnesium  Medicare annual wellness visit, initial  Macrocytosis without anemia - monitor, continue iron supp with Vitamin C and increase green leafy veggies -     CBC with Differential/Platelet  Need for prophylactic vaccination with tetanus-diphtheria (Td) -     Td vaccine greater than or equal to 7yo preservative free IM    Future Appointments Date Time Provider [place]  03/04/2017 11:30 AM Vicie Mutters, PA-C [facility] None  05/31/2017 9:00 AM Unk Pinto, MD [facility] None     Plan:   During the course of the visit the patient was educated and counseled about appropriate screening and preventive services including:    Pneumococcal vaccine   Influenza vaccine  Td vaccine  Screening electrocardiogram  Bone densitometry screening  Colorectal cancer screening  Diabetes screening  Glaucoma screening  Nutrition counseling   Advanced directives: requested   Subjective:  Timothy Vaughn is a 83 y.o. male who presents for Medicare Annual Wellness Visit and 6 month OV follow up.    He has had elevated blood pressure since 2004. His blood pressure has been  controlled at home, today their BP is BP: 118/62 He does workout. He denies chest pain, dizziness.  He has severe kypohoscolosis with restrictive lung disease, following pulmonary, normal echo with moderate TR. Breathing has been better/at baseline, has been having to take care of his wife, Timothy Vaughn who has been in the hospital.  He is on cholesterol medication and denies myalgias. His cholesterol is neart  goal. The cholesterol last visit was:   Lab Results  Component Value Date   CHOL 170 11/14/2016   HDL 54 11/14/2016   LDLCALC 101 (H) 11/14/2016   TRIG 75 11/14/2016   CHOLHDL 3.1 11/14/2016   He has had history of prediabetes with an A1C in the DM range one time Jan 2017  He has been working on diet for preDM and is no longer in the DM or preDM range, he is on bASA, he is not on ACE/ARB due to hypotension and denies foot ulcerations, hyperglycemia, hypoglycemia , increased appetite, nausea, paresthesia of the feet, polydipsia, polyuria and visual disturbances. Last A1C in the office was:  Lab Results  Component Value Date   HGBA1C 5.4 11/14/2016   Lab Results  Component Value Date   GFRNONAA 69 11/14/2016   Patient is on Vitamin D supplement.   Lab Results  Component Value Date   VD25OH 52 11/14/2016     Names of Other Physician/Practitioners you currently use: 1. [facility] here for primary care 2. Dr. Augusto Gamble, eye doctor, last visit Nov 2017 3. Dr Nicki Reaper, dentist, last visit Dec 2017  Patient  Care Team: Unk Pinto, MD as PCP - General (Internal Medicine) Inda Castle, MD as Consulting Physician (Gastroenterology) Carolan Clines, MD as Consulting Physician (Urology)   Medication Review: Current Outpatient Prescriptions on File Prior to Visit  Medication Sig  . Albuterol Sulfate (PROAIR RESPICLICK) 169 (90 BASE) MCG/ACT AEPB Inhale 2 Act into the lungs every 6 (six) hours as needed (for shortness of breath).  . Ascorbic Acid  (VITAMIN C) 100 MG tablet Take 100 mg by mouth daily.  Marland Kitchen aspirin 81 MG tablet Take 81 mg by mouth daily.  . Cholecalciferol (VITAMIN D PO) Take 8,000 Int'l Units by mouth daily.   . furosemide (LASIX) 40 MG tablet Take 1 tablet (40 mg total) by mouth daily as needed for fluid or edema.  . Multiple Vitamin (MULTI VITAMIN DAILY PO) Take 1 tablet by mouth daily.   . Omega-3 Fatty Acids (FISH OIL PO) Take 1 capsule by mouth daily.   . potassium chloride SA (K-DUR,KLOR-CON) 20 MEQ tablet TAKE 2 TABLETS BY MOUTH DAILY.  . pravastatin (PRAVACHOL) 40 MG tablet Take 1 tablet (40 mg total) by mouth daily.   No current facility-administered medications on file prior to visit.     Current Problems (verified) Patient Active Problem List   Diagnosis Date Noted  . Moderate tricuspid regurgitation 09/08/2015  . Macrocytosis without anemia 09/08/2015  . Medicare annual wellness visit, initial 04/12/2015  . Medication management 02/10/2014  . Essential hypertension 08/14/2013  . Hyperlipidemia 08/14/2013  . History of prediabetes 08/14/2013  . Vitamin D deficiency 08/14/2013  . Kyphoscoliosis 08/14/2013  . Restrictive lung disease due to kyphoscoliosis 08/14/2013   Screening Tests Immunization History  Administered Date(s) Administered  . DTaP 09/03/2005  . Influenza Whole 05/20/2013  . Influenza, High Dose Seasonal PF 06/04/2014, 06/09/2015, 05/03/2016  . Pneumococcal Conjugate-13 10/21/2015  . Pneumococcal Polysaccharide-23 02/05/2012  . Td 03/04/2017  . Zoster 09/25/2005   Preventative care: Last colonoscopy: 2007 declines due to age Echo 2017 PFTs 09/2015 CT AB 08/2010  Prior vaccinations: Tdap: 2018  Influenza: 05/2016 Pneumococcal: 02/2012 prevnar 13: 2017 Shingles/Zostavax: 09/2005  History reviewed: allergies, current medications, past family history, past medical history, past social history, past surgical history and problem list  Allergies Allergies  Allergen Reactions  .  Lipitor [Atorvastatin] Other (See Comments)    Severe NIV  . Niaspan [Niacin Er] Itching    SURGICAL HISTORY He  has a past surgical history that includes Prostatectomy (2001). FAMILY HISTORY His family history includes Alzheimer's disease in his father; COPD in his mother; Cancer in his brother and father; Diabetes in his maternal grandfather; Heart disease in his father and mother; Stroke in his maternal grandfather. SOCIAL HISTORY He  reports that he has never smoked. He has never used smokeless tobacco. He reports that he does not drink alcohol or use drugs.  MEDICARE WELLNESS OBJECTIVES: Physical activity: Current Exercise Habits: The patient does not participate in regular exercise at present, Exercise limited by: orthopedic condition(s) Cardiac risk factors: Cardiac Risk Factors include: advanced age (>12men, >39 women);dyslipidemia;family history of premature cardiovascular disease;male gender;sedentary lifestyle Depression/mood screen:   Depression screen [facility] 2/9 03/04/2017  Decreased Interest 0  Down, Depressed, Hopeless 0  PHQ - 2 Score 0    ADLs:  In your present state of health, do you have any difficulty performing the following activities: 03/04/2017 11/14/2016  Hearing? N N  Vision? N N  Difficulty concentrating or making decisions? N N  Walking or climbing stairs? Y N  Dressing  or bathing? - N  Doing errands, shopping? - N  Some recent data might be hidden     Cognitive Testing  Alert? Yes  Normal Appearance?Yes  Oriented to person? Yes  Place? Yes   Time? Yes  Recall of three objects?  Yes  Can perform simple calculations? Yes  Displays appropriate judgment?Yes  Can read the correct time from a watch face?Yes  EOL planning: Does Patient Have a Medical Advance Directive?: No Would patient like information on creating a medical advance directive?: No - Patient declined   Objective:     Blood pressure 118/62, pulse 77, temperature 97.5 F (36.4 C), resp. rate  16, height 5\' 4"  (1.626 m), weight 134 lb 12.8 oz (61.1 kg), SpO2 95 %. Body mass index is 23.14 kg/m.  General appearance: alert, no distress, WD/WN, male Eyes: PERRLA, EOMs, conjunctiva no swelling or erythema Sinuses: No Frontal/maxillary tenderness ENT/Mouth: Ext aud canals clear, TMs without erythema, bulging. No erythema, swelling, or exudate on post pharynx.  Tonsils not swollen or erythematous. Hearing normal.  Neck: Supple, thyroid normal.  Respiratory: Respiratory effort normal, distant lung sounds due to kyphoscoliosis but no wheezing, rales, rhonchi.  Cardio: RRR, bigeminy/extra beats, with no MRGs. Brisk peripheral pulses without edema.  Abdomen: Soft, + BS,  Non tender, no guarding, rebound, hernias, masses. Lymphatics: Non tender without lymphadenopathy.  Musculoskeletal: Severe kyphoscoliosis, 4/5 strength, Normal gait Skin: Warm, dry without rashes, lesions, ecchymosis.  Neuro: Cranial nerves intact. Normal muscle tone, no cerebellar symptoms. Psych: Awake and oriented X 3, normal affect, Insight and Judgment appropriate.  Medicare Attestation I have personally reviewed: The patient's medical and social history Their use of alcohol, tobacco or illicit drugs Their current medications and supplements The patient's functional ability including ADLs,fall risks, home safety risks, cognitive, and hearing and visual impairment Diet and physical activities Evidence for depression or mood disorders  The patient's weight, height, BMI, and visual acuity have been recorded in the chart.  I have made referrals, counseling, and provided education to the patient based on review of the above and I have provided the patient with a written personalized care plan for preventive services.    Vicie Mutters, PA-C   03/04/2017

## 2017-03-04 ENCOUNTER — Ambulatory Visit (INDEPENDENT_AMBULATORY_CARE_PROVIDER_SITE_OTHER): Payer: Medicare Other | Admitting: Physician Assistant

## 2017-03-04 ENCOUNTER — Encounter: Payer: Self-pay | Admitting: Physician Assistant

## 2017-03-04 VITALS — BP 118/62 | HR 77 | Temp 97.5°F | Resp 16 | Ht 64.0 in | Wt 134.8 lb

## 2017-03-04 DIAGNOSIS — E782 Mixed hyperlipidemia: Secondary | ICD-10-CM

## 2017-03-04 DIAGNOSIS — J984 Other disorders of lung: Secondary | ICD-10-CM

## 2017-03-04 DIAGNOSIS — Z79899 Other long term (current) drug therapy: Secondary | ICD-10-CM | POA: Diagnosis not present

## 2017-03-04 DIAGNOSIS — M4120 Other idiopathic scoliosis, site unspecified: Secondary | ICD-10-CM | POA: Diagnosis not present

## 2017-03-04 DIAGNOSIS — M419 Scoliosis, unspecified: Secondary | ICD-10-CM | POA: Diagnosis not present

## 2017-03-04 DIAGNOSIS — R6889 Other general symptoms and signs: Secondary | ICD-10-CM | POA: Diagnosis not present

## 2017-03-04 DIAGNOSIS — I10 Essential (primary) hypertension: Secondary | ICD-10-CM | POA: Diagnosis not present

## 2017-03-04 DIAGNOSIS — Z0000 Encounter for general adult medical examination without abnormal findings: Secondary | ICD-10-CM

## 2017-03-04 DIAGNOSIS — D7589 Other specified diseases of blood and blood-forming organs: Secondary | ICD-10-CM | POA: Diagnosis not present

## 2017-03-04 DIAGNOSIS — Z23 Encounter for immunization: Secondary | ICD-10-CM | POA: Diagnosis not present

## 2017-03-04 DIAGNOSIS — Z87898 Personal history of other specified conditions: Secondary | ICD-10-CM

## 2017-03-04 DIAGNOSIS — Z0001 Encounter for general adult medical examination with abnormal findings: Secondary | ICD-10-CM

## 2017-03-04 DIAGNOSIS — I071 Rheumatic tricuspid insufficiency: Secondary | ICD-10-CM

## 2017-03-04 DIAGNOSIS — E559 Vitamin D deficiency, unspecified: Secondary | ICD-10-CM

## 2017-03-04 LAB — HEPATIC FUNCTION PANEL
ALT: 19 U/L (ref 9–46)
AST: 23 U/L (ref 10–35)
Albumin: 4.2 g/dL (ref 3.6–5.1)
Alkaline Phosphatase: 63 U/L (ref 40–115)
BILIRUBIN INDIRECT: 0.6 mg/dL (ref 0.2–1.2)
Bilirubin, Direct: 0.2 mg/dL (ref <=0.2)
TOTAL PROTEIN: 6.7 g/dL (ref 6.1–8.1)
Total Bilirubin: 0.8 mg/dL (ref 0.2–1.2)

## 2017-03-04 LAB — CBC WITH DIFFERENTIAL/PLATELET
BASOS ABS: 0 {cells}/uL (ref 0–200)
Basophils Relative: 0 %
Eosinophils Absolute: 70 cells/uL (ref 15–500)
Eosinophils Relative: 1 %
HCT: 44.2 % (ref 38.5–50.0)
HEMOGLOBIN: 14.3 g/dL (ref 13.2–17.1)
LYMPHS ABS: 2590 {cells}/uL (ref 850–3900)
Lymphocytes Relative: 37 %
MCH: 32 pg (ref 27.0–33.0)
MCHC: 32.4 g/dL (ref 32.0–36.0)
MCV: 98.9 fL (ref 80.0–100.0)
MONO ABS: 630 {cells}/uL (ref 200–950)
MPV: 10.6 fL (ref 7.5–12.5)
Monocytes Relative: 9 %
NEUTROS PCT: 53 %
Neutro Abs: 3710 cells/uL (ref 1500–7800)
Platelets: 165 10*3/uL (ref 140–400)
RBC: 4.47 MIL/uL (ref 4.20–5.80)
RDW: 13.3 % (ref 11.0–15.0)
WBC: 7 10*3/uL (ref 3.8–10.8)

## 2017-03-04 LAB — BASIC METABOLIC PANEL WITH GFR
BUN: 26 mg/dL — ABNORMAL HIGH (ref 7–25)
CALCIUM: 9.4 mg/dL (ref 8.6–10.3)
CO2: 32 mmol/L — AB (ref 20–31)
Chloride: 102 mmol/L (ref 98–110)
Creat: 0.92 mg/dL (ref 0.70–1.11)
GFR, EST NON AFRICAN AMERICAN: 76 mL/min (ref >=60)
GFR, Est African American: 87 mL/min (ref >=60)
Glucose, Bld: 91 mg/dL (ref 65–99)
Potassium: 4.9 mmol/L (ref 3.5–5.3)
Sodium: 142 mmol/L (ref 135–146)

## 2017-03-04 LAB — LIPID PANEL
CHOLESTEROL: 151 mg/dL (ref <200)
HDL: 55 mg/dL (ref >40)
LDL CALC: 82 mg/dL (ref <100)
Total CHOL/HDL Ratio: 2.7 Ratio (ref <5.0)
Triglycerides: 72 mg/dL (ref <150)
VLDL: 14 mg/dL (ref <30)

## 2017-03-04 LAB — MAGNESIUM: MAGNESIUM: 2 mg/dL (ref 1.5–2.5)

## 2017-03-04 LAB — TSH: TSH: 0.92 mIU/L (ref 0.40–4.50)

## 2017-04-17 ENCOUNTER — Ambulatory Visit (INDEPENDENT_AMBULATORY_CARE_PROVIDER_SITE_OTHER): Payer: Medicare Other | Admitting: Internal Medicine

## 2017-04-17 ENCOUNTER — Ambulatory Visit: Payer: Self-pay | Admitting: Internal Medicine

## 2017-04-17 ENCOUNTER — Encounter: Payer: Self-pay | Admitting: Internal Medicine

## 2017-04-17 VITALS — BP 126/68 | HR 60 | Temp 97.3°F | Resp 18 | Ht 64.0 in | Wt 134.6 lb

## 2017-04-17 DIAGNOSIS — J0410 Acute tracheitis without obstruction: Secondary | ICD-10-CM

## 2017-04-17 MED ORDER — PROMETHAZINE-DM 6.25-15 MG/5ML PO SYRP: ORAL_SOLUTION | ORAL | 1 refills | Status: DC

## 2017-04-17 MED ORDER — AZITHROMYCIN 250 MG PO TABS: ORAL_TABLET | ORAL | 1 refills | Status: DC

## 2017-04-17 MED ORDER — PREDNISONE 20 MG PO TABS: ORAL_TABLET | ORAL | 0 refills | Status: DC

## 2017-04-17 NOTE — Progress Notes (Signed)
  Subjective:    Patient ID: Timothy Vaughn, male    DOB: 11/08/1928, 83 y.o.   MRN: [id number]  HPI  Patient presents with 1 week prodrome of worsening hoarseness and dry cough. Patient has moderately severe Restrictive Lung Dz from severe kyphorotoscoliosis. Denies fevers , chills, sweats or dyspnea (yet) - says O2 sats at home are about 94-95%.   Medication Sig  . PROAIR RESPICLICK I2 Act  every 6  hours as needed (  . VITAMIN C 100 MG tablet Take 100 mg by mouth daily.  Marland Kitchen aspirin 81 MG tablet Take 81 mg by mouth daily.  Marland Kitchen VITAMIN D  Take 8,000 Int'l Units by mouth daily.   . Furosemide 40 MG tablet Take 1 tab daily as needed for fluid or edema.  . Multiple Vitamin  Take 1 tablet by mouth daily.   . Omega-3FISH OIL  Take 1 capsule by mouth daily.   Marland Kitchen K-DUR 20 MEQ  TAKE 2 TABLETS BY MOUTH DAILY.  . pravastatin  40 MG tablet Take 1 tablet (40 mg total) by mouth daily.   Allergies  Allergen Reactions  . Lipitor [Atorvastatin] Other (See Comments)    Severe NIV  . Niaspan [Niacin Er] Itching   Past Medical History:  Diagnosis Date  . Cancer [facility])    prostate  . Hyperlipidemia   . Hypertension   . Pre-diabetes   . Vitamin D deficiency    Past Surgical History:  Procedure Laterality Date  . PROSTATECTOMY  2001   Review of Systems   10 point systems review negative except as above.    Objective:   Physical Exam  BP 126/68   Pulse 60   Temp (!) 97.3 F (36.3 C)   Resp 18   Ht 5\' 4"  (1.626 m)   Wt 134 lb 9.6 oz (61.1 kg)   BMI 23.10 kg/m  O2 sats = 93% on Rm Air  HEENT - Eac's patent. TM's Nl. EOM's full. PERRLA. NasoOroPharynx clear. Neck - supple. Nl Thyroid. Carotids 2+ & No bruits, nodes, JVD Chest - kypho-rotoscoliosis. BS decreased w/few Rales and no rhonchi, wheezes. Cor - Nl HS. RRR w/o sig MGR. PP 1(+). No edema. MS- FROM w/o deformities. Muscle power, tone and bulk Nl. Gait Nl. Neuro - No obvious Cr N abnormalities. Nl w/o focal abnormalities.     Assessment & Plan:   1. Tracheitis  - predniSONE 20 MG ; 1 tab 3 x day - 3 days, then 1 tab 2 x day - 3 days, then 1 tab 1 x da y- 5 days  Disp: 20 tab  - azithromycin 250 MG tablet; Take 2 tab on  Day 1,  followed by 1 tabl) once/da x 5 days.  Disp: 6 each; Refill: 1  - PROMETHAZINE-DM 6.25-15 MG/5ML syrup; Take 1 to 2 tsp every 4 hours if needed for cough  Dispense: 360 mL; Refill: 1  - Discussed meds & SE's and sx's to return for or go to ER.

## 2017-04-17 NOTE — Patient Instructions (Signed)

## 2017-04-29 ENCOUNTER — Other Ambulatory Visit: Payer: Self-pay | Admitting: Internal Medicine

## 2017-05-28 ENCOUNTER — Ambulatory Visit (INDEPENDENT_AMBULATORY_CARE_PROVIDER_SITE_OTHER): Payer: Medicare Other | Admitting: *Deleted

## 2017-05-28 DIAGNOSIS — Z23 Encounter for immunization: Secondary | ICD-10-CM

## 2017-05-31 ENCOUNTER — Encounter: Payer: Self-pay | Admitting: Internal Medicine

## 2017-06-05 DIAGNOSIS — D1801 Hemangioma of skin and subcutaneous tissue: Secondary | ICD-10-CM | POA: Diagnosis not present

## 2017-06-05 DIAGNOSIS — Z85828 Personal history of other malignant neoplasm of skin: Secondary | ICD-10-CM | POA: Diagnosis not present

## 2017-06-05 DIAGNOSIS — D225 Melanocytic nevi of trunk: Secondary | ICD-10-CM | POA: Diagnosis not present

## 2017-06-05 DIAGNOSIS — D2262 Melanocytic nevi of left upper limb, including shoulder: Secondary | ICD-10-CM | POA: Diagnosis not present

## 2017-06-05 DIAGNOSIS — D2261 Melanocytic nevi of right upper limb, including shoulder: Secondary | ICD-10-CM | POA: Diagnosis not present

## 2017-06-05 DIAGNOSIS — L570 Actinic keratosis: Secondary | ICD-10-CM | POA: Diagnosis not present

## 2017-06-05 DIAGNOSIS — L821 Other seborrheic keratosis: Secondary | ICD-10-CM | POA: Diagnosis not present

## 2017-06-05 DIAGNOSIS — D485 Neoplasm of uncertain behavior of skin: Secondary | ICD-10-CM | POA: Diagnosis not present

## 2017-06-05 DIAGNOSIS — D2239 Melanocytic nevi of other parts of face: Secondary | ICD-10-CM | POA: Diagnosis not present

## 2017-06-05 DIAGNOSIS — D224 Melanocytic nevi of scalp and neck: Secondary | ICD-10-CM | POA: Diagnosis not present

## 2017-07-18 ENCOUNTER — Encounter: Payer: Self-pay | Admitting: Internal Medicine

## 2017-07-18 ENCOUNTER — Ambulatory Visit (INDEPENDENT_AMBULATORY_CARE_PROVIDER_SITE_OTHER): Payer: Medicare Other | Admitting: Internal Medicine

## 2017-07-18 VITALS — BP 124/76 | HR 70 | Temp 96.6°F | Resp 16 | Ht 64.0 in | Wt 134.4 lb

## 2017-07-18 DIAGNOSIS — R972 Elevated prostate specific antigen [PSA]: Secondary | ICD-10-CM

## 2017-07-18 DIAGNOSIS — R7303 Prediabetes: Secondary | ICD-10-CM | POA: Diagnosis not present

## 2017-07-18 DIAGNOSIS — I714 Abdominal aortic aneurysm, without rupture, unspecified: Secondary | ICD-10-CM

## 2017-07-18 DIAGNOSIS — Z136 Encounter for screening for cardiovascular disorders: Secondary | ICD-10-CM | POA: Diagnosis not present

## 2017-07-18 DIAGNOSIS — Z125 Encounter for screening for malignant neoplasm of prostate: Secondary | ICD-10-CM

## 2017-07-18 DIAGNOSIS — Z79899 Other long term (current) drug therapy: Secondary | ICD-10-CM | POA: Diagnosis not present

## 2017-07-18 DIAGNOSIS — M4120 Other idiopathic scoliosis, site unspecified: Secondary | ICD-10-CM

## 2017-07-18 DIAGNOSIS — N401 Enlarged prostate with lower urinary tract symptoms: Secondary | ICD-10-CM | POA: Diagnosis not present

## 2017-07-18 DIAGNOSIS — M419 Scoliosis, unspecified: Secondary | ICD-10-CM

## 2017-07-18 DIAGNOSIS — E782 Mixed hyperlipidemia: Secondary | ICD-10-CM | POA: Diagnosis not present

## 2017-07-18 DIAGNOSIS — I10 Essential (primary) hypertension: Secondary | ICD-10-CM | POA: Diagnosis not present

## 2017-07-18 DIAGNOSIS — Z1212 Encounter for screening for malignant neoplasm of rectum: Secondary | ICD-10-CM

## 2017-07-18 DIAGNOSIS — E559 Vitamin D deficiency, unspecified: Secondary | ICD-10-CM

## 2017-07-18 DIAGNOSIS — Z1211 Encounter for screening for malignant neoplasm of colon: Secondary | ICD-10-CM

## 2017-07-18 DIAGNOSIS — J984 Other disorders of lung: Secondary | ICD-10-CM | POA: Diagnosis not present

## 2017-07-18 NOTE — Progress Notes (Signed)
[facility]   Unk Pinto, M.D.     Uvaldo Bristle. Silverio Lay, P.A.-C Liane Comber, [place]                [address] [place], [place] [address] Telephone [phone] Telefax [phone]  Comprehensive Evaluation & Examination     This very nice 83 y.o. MWM presents for a  comprehensive evaluation and management of multiple medical co-morbidities.  Patient has been followed for HTN, Prediabetes, Hyperlipidemia and Vitamin D Deficiency. Other problems include severe restrictive lung disease consequent of severe thoracolumbar rotoscoliosis. He does admit DOE after short distances with O2 sats dropping from resing baseline of mid 90's to the mid 80's  (86-88%) with minimal ambulation.      HTN predates circa 2004. Patient's BP has been controlled at home.  Today's BP is at goal - 124/76. Patient denies any cardiac symptoms as chest pain, palpitations, shortness of breath, dizziness or ankle swelling. Last year screening Aortoscan was suspect for AAA at 3.5 cm.      Patient's hyperlipidemia is controlled with diet and medications. Patient denies myalgias or other medication SE's. 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 Last A1c was back in the normal range.  Lab Results  Component Value Date   HGBA1C 5.4 11/14/2016       Finally, patient has history of Vitamin D Deficiency ("35" in 2012) and last vitamin D was at goal: Lab Results  Component Value Date   VD25OH 47 11/14/2016   Current Outpatient Medications on File Prior to  Visit  Medication Sig  . Albuterol Sulfate (PROAIR RESPICLICK) 401 (90 BASE) MCG/ACT AEPB Inhale 2 Act into the lungs every 6 (six) hours as needed (for shortness of breath).  . Ascorbic Acid (VITAMIN C) 100 MG tablet Take 100 mg by mouth daily.  Marland Kitchen aspirin 81 MG tablet Take 81 mg by mouth daily.  . Cholecalciferol (VITAMIN D PO) Take 8,000 Int'l Units by mouth daily.   . furosemide (LASIX) 40 MG tablet TAKE 1 TABLET (40 MG TOTAL) BY MOUTH DAILY AS NEEDED FOR FLUID OR EDEMA.  . Multiple Vitamin (MULTI VITAMIN DAILY PO) Take 1 tablet by mouth daily.   . Omega-3 Fatty Acids (FISH OIL PO) Take 1 capsule by mouth daily.   . potassium chloride SA (K-DUR,KLOR-CON) 20 MEQ tablet TAKE 2 TABLETS BY MOUTH DAILY.  . pravastatin (PRAVACHOL) 40 MG tablet Take 1 tablet (40 mg total) by mouth daily.   No current facility-administered medications on file prior to visit.    Allergies  Allergen Reactions  . Lipitor [Atorvastatin] Other (See Comments)    Severe NIV  . Niaspan [Niacin Er] Itching   Past Medical History:  Diagnosis Date  . Cancer [facility])    prostate  . Hyperlipidemia   . Hypertension   . Pre-diabetes   . Vitamin D deficiency    Health Maintenance  Topic Date Due  . URINE  MICROALBUMIN  05/03/2017  . TETANUS/TDAP  03/05/2027  . INFLUENZA VACCINE  Completed  . PNA vac Low Risk Adult  Completed   Immunization History  Administered Date(s) Administered  . DTaP 09/03/2005  . Influenza Whole 05/20/2013  . Influenza, High Dose Seasonal PF 06/04/2014, 06/09/2015, 05/03/2016, 05/28/2017  . Pneumococcal Conjugate-13 10/21/2015  . Pneumococcal Polysaccharide-23 02/05/2012  . Td 03/04/2017  . Zoster 09/25/2005   Past Surgical History:  Procedure Laterality Date  . PROSTATECTOMY  2001   Family History  Problem Relation Age of Onset  . Heart disease Mother   . COPD Mother   . Cancer Father        colon  . Alzheimer's disease Father   . Heart disease Father   . Cancer Brother         stomach  . Stroke Maternal Grandfather   . Diabetes Maternal Grandfather    Social History   Socioeconomic History  . Marital status: Married  Social Needs  Occupational History  . Retired [employer]  Tobacco Use  . Smoking status: Never Smoker  . Smokeless tobacco: Never Used  Substance and Sexual Activity  . Alcohol use: No  . Drug use: No  . Sexual activity: Not on file    ROS Constitutional: Denies fever, chills, weight loss/gain, headaches, insomnia,  night sweats or change in appetite. Does c/o fatigue. Eyes: Denies redness, blurred vision, diplopia, discharge, itchy or watery eyes.  ENT: Denies discharge, congestion, post nasal drip, epistaxis, sore throat, earache, hearing loss, dental pain, Tinnitus, Vertigo, Sinus pain or snoring.  Cardio: Denies chest pain, palpitations, irregular heartbeat, syncope, dyspnea, diaphoresis, orthopnea, PND, claudication or edema Respiratory: denies cough, pleurisy, hoarseness, laryngitis or wheezing.  Gastrointestinal: Denies dysphagia, heartburn, reflux, water brash, pain, cramps, nausea, vomiting, bloating, diarrhea, constipation, hematemesis, melena, hematochezia, jaundice or hemorrhoids Genitourinary: Denies dysuria, frequency, urgency, nocturia, hesitancy, discharge, hematuria or flank pain Musculoskeletal: Denies arthralgia, myalgia, stiffness, Jt. Swelling, pain, limp or strain/sprain. Denies Falls. Skin: Denies puritis, rash, hives, warts, acne, eczema or change in skin lesion Neuro: No weakness, tremor, incoordination, spasms, paresthesia or pain Psychiatric: Denies confusion, memory loss or sensory loss. Denies Depression. Endocrine: Denies change in weight, skin, hair change, nocturia, and paresthesia, diabetic polys, visual blurring or hyper / hypo glycemic episodes.  Heme/Lymph: No excessive bleeding, bruising or enlarged lymph nodes.  Physical Exam  BP 124/76   Pulse 70   Temp (!) 96.6 F (35.9 C)   Resp 16    Ht 5\' 4"  (1.626 m)   Wt 134 lb 6.4 oz (61 kg)   SpO2 96%   BMI 23.07 kg/m   General Appearance: Well nourished and well groomed and in no apparent distress.  Eyes: PERRLA, EOMs, conjunctiva no swelling or erythema, normal fundi and vessels. Sinuses: No frontal/maxillary tenderness ENT/Mouth: EACs patent / TMs  nl. Nares clear without erythema, swelling, mucoid exudates. Oral hygiene is good. No erythema, swelling, or exudate. Tongue normal, non-obstructing. Tonsils not swollen or erythematous. Hearing normal.  Neck: Supple, thyroid normal. No bruits, nodes or JVD. Respiratory: Respiratory effort normal.  BS equal and clear bilateral without rales, rhonci, wheezing or stridor. Cardio: Heart sounds are normal with regular rate and rhythm and no murmurs, rubs or gallops. Peripheral pulses are normal and equal bilaterally without edema. No aortic or femoral bruits. Chest: Severe thoracolumbar rotoscoliosis with moderate severe chest deformity.   Abdomen: Soft, with Nl bowel sounds. Nontender, no guarding, rebound, hernias, masses, or organomegaly.  Lymphatics: Non tender without  lymphadenopathy.  Genitourinary: DRE - deferred for age Musculoskeletal: Full ROM all peripheral extremities, joint stability, 5/5 strength, and normal gait. Skin: Warm and dry without rashes, lesions, cyanosis, clubbing or  ecchymosis.  Neuro: Cranial nerves intact, reflexes equal bilaterally. Normal muscle tone, no cerebellar symptoms. Sensation intact.  Pysch: Alert and oriented X 3 with normal affect, insight and judgment appropriate.   Assessment and Plan  1. Essential hypertension  - EKG 12-Lead - [place], RETROPERITNL ABD,  LTD - Urinalysis, Routine w reflex microscopic - Microalbumin / creatinine urine ratio - CBC with Differential/Platelet - BASIC METABOLIC PANEL WITH GFR - Magnesium - TSH  2. Hyperlipidemia, mixed  - EKG 12-Lead - [place], RETROPERITNL ABD,  LTD - Hepatic function panel - Lipid panel -  TSH  3. Prediabetes  - EKG 12-Lead - [place], RETROPERITNL ABD,  LTD - Hemoglobin A1c - Insulin, random  4. Vitamin D deficiency  - VITAMIN D 25 Hydroxy   5. Screening for colorectal cancer  - POC Hemoccult Bld/Stl  6. Kyphoscoliosis   7. Restrictive lung disease due to kyphoscoliosis   8. Prostate cancer screening  - PSA  9. Elevated PSA  - PSA  10. Benign localized prostatic hyperplasia with lower urinary tract symptoms (LUTS)  - PSA  11. Screening for ischemic heart disease  - EKG 12-Lead  12. AAA (abdominal aortic aneurysm) without rupture (HCC)  - [place], RETROPERITNL ABD,  LTD  13. Screening for AAA (aortic abdominal aneurysm)  - [place], RETROPERITNL ABD,  LTD  14. Medication management  - Urinalysis, Routine w reflex microscopic - Microalbumin / creatinine urine ratio - CBC with Differential/Platelet - BASIC METABOLIC PANEL WITH GFR - Hepatic function panel - Magnesium - Lipid panel - TSH - Hemoglobin A1c - Insulin, random - VITAMIN D 25 Hydroxy         Patient was counseled in prudent diet, weight control to achieve/maintain BMI less than 25, BP monitoring, regular exercise and medications as discussed.  Discussed med effects and SE's. Routine screening labs and tests as requested with regular follow-up as recommended. Over 40 minutes of exam, counseling, chart review and high complex critical decision making was performed

## 2017-07-18 NOTE — Patient Instructions (Signed)

## 2017-07-19 LAB — LIPID PANEL
Cholesterol: 171 mg/dL (ref <200)
HDL: 61 mg/dL (ref >40)
LDL Cholesterol (Calc): 91 mg/dL (calc)
Non-HDL Cholesterol (Calc): 110 mg/dL (calc) (ref <130)
Total CHOL/HDL Ratio: 2.8 (calc) (ref <5.0)
Triglycerides: 91 mg/dL (ref <150)

## 2017-07-19 LAB — MICROALBUMIN / CREATININE URINE RATIO
Creatinine, Urine: 79 mg/dL (ref 20–320)
Microalb Creat Ratio: 20 mcg/mg creat (ref <30)
Microalb, Ur: 1.6 mg/dL

## 2017-07-19 LAB — CBC WITH DIFFERENTIAL/PLATELET
BASOS ABS: 11 {cells}/uL (ref 0–200)
Basophils Relative: 0.2 %
EOS PCT: 0.6 %
Eosinophils Absolute: 32 cells/uL (ref 15–500)
HCT: 42.9 % (ref 38.5–50.0)
Hemoglobin: 14.4 g/dL (ref 13.2–17.1)
Lymphs Abs: 1966 cells/uL (ref 850–3900)
MCH: 32 pg (ref 27.0–33.0)
MCHC: 33.6 g/dL (ref 32.0–36.0)
MCV: 95.3 fL (ref 80.0–100.0)
MONOS PCT: 12.2 %
MPV: 10.6 fL (ref 7.5–12.5)
NEUTROS PCT: 49.9 %
Neutro Abs: 2645 cells/uL (ref 1500–7800)
PLATELETS: 157 10*3/uL (ref 140–400)
RBC: 4.5 10*6/uL (ref 4.20–5.80)
RDW: 11.8 % (ref 11.0–15.0)
Total Lymphocyte: 37.1 %
WBC mixed population: 647 cells/uL (ref 200–950)
WBC: 5.3 10*3/uL (ref 3.8–10.8)

## 2017-07-19 LAB — URINALYSIS, ROUTINE W REFLEX MICROSCOPIC
BILIRUBIN URINE: NEGATIVE
Glucose, UA: NEGATIVE
Hgb urine dipstick: NEGATIVE
KETONES UR: NEGATIVE
Leukocytes, UA: NEGATIVE
Nitrite: NEGATIVE
PH: 7 (ref 5.0–8.0)
Protein, ur: NEGATIVE
SPECIFIC GRAVITY, URINE: 1.021 (ref 1.001–1.03)

## 2017-07-19 LAB — HEPATIC FUNCTION PANEL
AG Ratio: 1.6 (calc) (ref 1.0–2.5)
ALKALINE PHOSPHATASE (APISO): 75 U/L (ref 40–115)
ALT: 23 U/L (ref 9–46)
AST: 28 U/L (ref 10–35)
Albumin: 4.2 g/dL (ref 3.6–5.1)
BILIRUBIN INDIRECT: 0.6 mg/dL (ref 0.2–1.2)
BILIRUBIN TOTAL: 0.8 mg/dL (ref 0.2–1.2)
Bilirubin, Direct: 0.2 mg/dL (ref 0.0–0.2)
Globulin: 2.6 g/dL (calc) (ref 1.9–3.7)
TOTAL PROTEIN: 6.8 g/dL (ref 6.1–8.1)

## 2017-07-19 LAB — MAGNESIUM: Magnesium: 2 mg/dL (ref 1.5–2.5)

## 2017-07-19 LAB — PSA: PSA: <0.1 ng/mL (ref <=4.0)

## 2017-07-19 LAB — HEMOGLOBIN A1C
Hgb A1c MFr Bld: 5.5 % of total Hgb (ref <5.7)
Mean Plasma Glucose: 111 (calc)
eAG (mmol/L): 6.2 (calc)

## 2017-07-19 LAB — BASIC METABOLIC PANEL WITH GFR
BUN / CREAT RATIO: 34 (calc) — AB (ref 6–22)
BUN: 27 mg/dL — ABNORMAL HIGH (ref 7–25)
CO2: 36 mmol/L — ABNORMAL HIGH (ref 20–32)
Calcium: 9.4 mg/dL (ref 8.6–10.3)
Chloride: 101 mmol/L (ref 98–110)
Creat: 0.8 mg/dL (ref 0.70–1.11)
GFR, EST NON AFRICAN AMERICAN: 81 mL/min/{1.73_m2} (ref >=60)
GFR, Est African American: 94 mL/min/{1.73_m2} (ref >=60)
GLUCOSE: 69 mg/dL (ref 65–99)
POTASSIUM: 4.4 mmol/L (ref 3.5–5.3)
SODIUM: 144 mmol/L (ref 135–146)

## 2017-07-19 LAB — TSH: TSH: 0.8 mIU/L (ref 0.40–4.50)

## 2017-07-19 LAB — INSULIN, RANDOM: INSULIN: 4.6 u[IU]/mL (ref 2.0–19.6)

## 2017-07-19 LAB — VITAMIN D 25 HYDROXY (VIT D DEFICIENCY, FRACTURES): VIT D 25 HYDROXY: 78 ng/mL (ref 30–100)

## 2017-09-06 DIAGNOSIS — Z85828 Personal history of other malignant neoplasm of skin: Secondary | ICD-10-CM | POA: Diagnosis not present

## 2017-09-06 DIAGNOSIS — L821 Other seborrheic keratosis: Secondary | ICD-10-CM | POA: Diagnosis not present

## 2017-09-06 DIAGNOSIS — D485 Neoplasm of uncertain behavior of skin: Secondary | ICD-10-CM | POA: Diagnosis not present

## 2017-09-06 DIAGNOSIS — D1801 Hemangioma of skin and subcutaneous tissue: Secondary | ICD-10-CM | POA: Diagnosis not present

## 2017-09-06 DIAGNOSIS — L814 Other melanin hyperpigmentation: Secondary | ICD-10-CM | POA: Diagnosis not present

## 2017-09-06 DIAGNOSIS — D225 Melanocytic nevi of trunk: Secondary | ICD-10-CM | POA: Diagnosis not present

## 2017-09-06 DIAGNOSIS — L570 Actinic keratosis: Secondary | ICD-10-CM | POA: Diagnosis not present

## 2017-09-06 DIAGNOSIS — L910 Hypertrophic scar: Secondary | ICD-10-CM | POA: Diagnosis not present

## 2017-09-06 DIAGNOSIS — D2261 Melanocytic nevi of right upper limb, including shoulder: Secondary | ICD-10-CM | POA: Diagnosis not present

## 2017-10-28 NOTE — Progress Notes (Signed)
FOLLOW UP  Assessment and Plan:   Hypertension Well controlled with current medications; discussed taking lasix only as needed, reduce potassium accordingly Monitor blood pressure at home; patient to call if consistently greater than 130/80 Continue DASH diet.   Reminder to go to the ER if any CP, SOB, nausea, dizziness, severe HA, changes vision/speech, left arm numbness and tingling and jaw pain.  Cholesterol Currently at goal; discussed secondary to age trying to cut back on pravastatin - 20 mg daily Continue low cholesterol diet and exercise.  Check lipid panel.   History of Prediabetes Recent A1Cs at goal Continue diet and exercise.  Perform daily foot/skin check, notify office of any concerning changes.  Defer A1C; check BMP  Normal BMI ( 22.83 ) Long discussion about diet and exercise Recommended diet heavy in fruits and veggies and low in animal meats, cheeses, and dairy products, appropriate calorie intake Discussed ideal weight range for height  Will follow up in 3 months  Vitamin D Def At goal at last visit; continue supplementation to maintain goal of 70-100 Defer Vit D level  Continue diet and meds as discussed. Further disposition pending results of labs. Discussed med's effects and SE's.   Over 30 minutes of exam, counseling, chart review, and critical decision making was performed.   Future Appointments  Date Time Provider [place]  01/30/2018 10:30 AM Unk Pinto, MD [facility] None  08/05/2018 11:00 AM Unk Pinto, MD [facility] None    ----------------------------------------------------------------------------------------------------------------------  HPI 84 y.o. male  presents for 3 month follow up on hypertension, cholesterol, history of prediabetes, weight and vitamin D deficiency.   BMI is Body mass index is 22.83 kg/m., he has been working on diet. He reports his exercise is limited recently related to declining condition of  his wife.  Wt Readings from Last 3 Encounters:  10/29/17 133 lb (60.3 kg)  07/18/17 134 lb 6.4 oz (61 kg)  04/17/17 134 lb 9.6 oz (61.1 kg)   His blood pressure has been controlled at home, today their BP is BP: 106/60  He does not workout. He denies chest pain, shortness of breath, dizziness.   He is on cholesterol medication (pravastatin 40 mg daily) and denies myalgias. His cholesterol is at goal. The cholesterol last visit was:   Lab Results  Component Value Date   CHOL 171 07/18/2017   HDL 61 07/18/2017   LDLCALC 82 03/04/2017   TRIG 91 07/18/2017   CHOLHDL 2.8 07/18/2017    He has been working on diet and exercise for history of prediabetes, and denies increased appetite, nausea, paresthesia of the feet, polydipsia, polyuria, visual disturbances and vomiting. Last A1C in the office was:  Lab Results  Component Value Date   HGBA1C 5.5 07/18/2017   Patient is on Vitamin D supplement and at goal at recent check:    Lab Results  Component Value Date   VD25OH 78 07/18/2017        Current Medications:  Current Outpatient Medications on File Prior to Visit  Medication Sig  . Albuterol Sulfate (PROAIR RESPICLICK) 009 (90 BASE) MCG/ACT AEPB Inhale 2 Act into the lungs every 6 (six) hours as needed (for shortness of breath).  . Ascorbic Acid (VITAMIN C) 100 MG tablet Take 100 mg by mouth daily.  Marland Kitchen aspirin 81 MG tablet Take 81 mg by mouth daily.  . Cholecalciferol (VITAMIN D PO) Take 8,000 Int'l Units by mouth daily.   . furosemide (LASIX) 40 MG tablet TAKE 1 TABLET (40 MG TOTAL)  BY MOUTH DAILY AS NEEDED FOR FLUID OR EDEMA.  . Multiple Vitamin (MULTI VITAMIN DAILY PO) Take 1 tablet by mouth daily.   . Omega-3 Fatty Acids (FISH OIL PO) Take 1 capsule by mouth daily.   . potassium chloride SA (K-DUR,KLOR-CON) 20 MEQ tablet TAKE 2 TABLETS BY MOUTH DAILY.  . pravastatin (PRAVACHOL) 40 MG tablet Take 1 tablet (40 mg total) by mouth daily.   No current facility-administered  medications on file prior to visit.      Allergies:  Allergies  Allergen Reactions  . Lipitor [Atorvastatin] Other (See Comments)    Severe NIV  . Niaspan [Niacin Er] Itching     Medical History:  Past Medical History:  Diagnosis Date  . Cancer [facility])    prostate  . Hyperlipidemia   . Hypertension   . Pre-diabetes   . Vitamin D deficiency    Family history- Reviewed and unchanged Social history- Reviewed and unchanged   Review of Systems:  Review of Systems  Constitutional: Negative for malaise/fatigue and weight loss.  HENT: Negative for congestion, hearing loss, sore throat and tinnitus.   Eyes: Negative for blurred vision and double vision.  Respiratory: Negative for cough, sputum production, shortness of breath and wheezing.   Cardiovascular: Negative for chest pain, palpitations, orthopnea, claudication and leg swelling.  Gastrointestinal: Negative for abdominal pain, blood in stool, constipation, diarrhea, heartburn, melena, nausea and vomiting.  Genitourinary: Negative.   Musculoskeletal: Negative for joint pain and myalgias.  Skin: Negative for rash.  Neurological: Negative for dizziness, tingling, sensory change, weakness and headaches.  Endo/Heme/Allergies: Negative for polydipsia.  Psychiatric/Behavioral: Negative.   All other systems reviewed and are negative.   Physical Exam: BP 106/60   Pulse (!) 54   Temp 97.9 F (36.6 C)   Ht 5\' 4"  (1.626 m)   Wt 133 lb (60.3 kg)   SpO2 98%   BMI 22.83 kg/m  Wt Readings from Last 3 Encounters:  10/29/17 133 lb (60.3 kg)  07/18/17 134 lb 6.4 oz (61 kg)  04/17/17 134 lb 9.6 oz (61.1 kg)   General Appearance: Well nourished, in no apparent distress. Eyes: left pupil reactive to light, right pupil fixed - per patient this is chronic, vision intact without changes, EOMs, conjunctiva no swelling or erythema Sinuses: No Frontal/maxillary tenderness ENT/Mouth: Ext aud canals clear, TMs without erythema, bulging. No  erythema, swelling, or exudate on post pharynx.  Tonsils not swollen or erythematous. Hearing normal.  Neck: Supple, thyroid normal.  Respiratory: Respiratory effort normal, BS present throughout without rales, rhonchi, wheezing or stridor.  Cardio: RRR with no MRGs. Brisk peripheral pulses without edema.  Abdomen: Soft, + BS.  Non tender, no guarding, rebound, hernias, masses. Lymphatics: Non tender without lymphadenopathy.  Musculoskeletal: Severe kyphoscoliosis limiting upper/mid back and chest expansion, symmetrical extremity strength, slow gait Skin: Warm, dry without rashes, lesions, ecchymosis.  Neuro: Cranial nerves intact. No cerebellar symptoms.  Psych: Awake and oriented X 3, normal affect, Insight and Judgment appropriate.    Izora Ribas, NP 10:39 AM [facility]

## 2017-10-29 ENCOUNTER — Encounter: Payer: Self-pay | Admitting: Adult Health

## 2017-10-29 ENCOUNTER — Ambulatory Visit (INDEPENDENT_AMBULATORY_CARE_PROVIDER_SITE_OTHER): Payer: Medicare Other | Admitting: Adult Health

## 2017-10-29 VITALS — BP 106/60 | HR 54 | Temp 97.9°F | Ht 64.0 in | Wt 133.0 lb

## 2017-10-29 DIAGNOSIS — Z79899 Other long term (current) drug therapy: Secondary | ICD-10-CM | POA: Diagnosis not present

## 2017-10-29 DIAGNOSIS — E559 Vitamin D deficiency, unspecified: Secondary | ICD-10-CM

## 2017-10-29 DIAGNOSIS — E782 Mixed hyperlipidemia: Secondary | ICD-10-CM | POA: Diagnosis not present

## 2017-10-29 DIAGNOSIS — I10 Essential (primary) hypertension: Secondary | ICD-10-CM | POA: Diagnosis not present

## 2017-10-29 DIAGNOSIS — Z87898 Personal history of other specified conditions: Secondary | ICD-10-CM

## 2017-10-29 LAB — HEPATIC FUNCTION PANEL
AG Ratio: 1.7 (calc) (ref 1.0–2.5)
ALBUMIN MSPROF: 4.1 g/dL (ref 3.6–5.1)
ALT: 18 U/L (ref 9–46)
AST: 23 U/L (ref 10–35)
Alkaline phosphatase (APISO): 58 U/L (ref 40–115)
Bilirubin, Direct: 0.2 mg/dL (ref 0.0–0.2)
GLOBULIN: 2.4 g/dL (ref 1.9–3.7)
Indirect Bilirubin: 0.6 mg/dL (calc) (ref 0.2–1.2)
TOTAL PROTEIN: 6.5 g/dL (ref 6.1–8.1)
Total Bilirubin: 0.8 mg/dL (ref 0.2–1.2)

## 2017-10-29 LAB — CBC WITH DIFFERENTIAL/PLATELET
BASOS PCT: 0.3 %
Basophils Absolute: 20 cells/uL (ref 0–200)
Eosinophils Absolute: 60 cells/uL (ref 15–500)
Eosinophils Relative: 0.9 %
HCT: 42.3 % (ref 38.5–50.0)
Hemoglobin: 14.2 g/dL (ref 13.2–17.1)
Lymphs Abs: 2097 cells/uL (ref 850–3900)
MCH: 32.1 pg (ref 27.0–33.0)
MCHC: 33.6 g/dL (ref 32.0–36.0)
MCV: 95.5 fL (ref 80.0–100.0)
MONOS PCT: 11 %
MPV: 10.7 fL (ref 7.5–12.5)
Neutro Abs: 3786 cells/uL (ref 1500–7800)
Neutrophils Relative %: 56.5 %
PLATELETS: 139 10*3/uL — AB (ref 140–400)
RBC: 4.43 10*6/uL (ref 4.20–5.80)
RDW: 12 % (ref 11.0–15.0)
TOTAL LYMPHOCYTE: 31.3 %
WBC: 6.7 10*3/uL (ref 3.8–10.8)
WBCMIX: 737 {cells}/uL (ref 200–950)

## 2017-10-29 LAB — BASIC METABOLIC PANEL WITH GFR
BUN: 23 mg/dL (ref 7–25)
CALCIUM: 9.6 mg/dL (ref 8.6–10.3)
CHLORIDE: 101 mmol/L (ref 98–110)
CO2: 36 mmol/L — AB (ref 20–32)
Creat: 0.83 mg/dL (ref 0.70–1.11)
GFR, Est African American: 92 mL/min/{1.73_m2} (ref >=60)
GFR, Est Non African American: 80 mL/min/{1.73_m2} (ref >=60)
GLUCOSE: 85 mg/dL (ref 65–99)
Potassium: 4.5 mmol/L (ref 3.5–5.3)
Sodium: 142 mmol/L (ref 135–146)

## 2017-10-29 LAB — LIPID PANEL
CHOL/HDL RATIO: 2.8 (calc) (ref <5.0)
Cholesterol: 156 mg/dL (ref <200)
HDL: 55 mg/dL (ref >40)
LDL Cholesterol (Calc): 82 mg/dL (calc)
NON-HDL CHOLESTEROL (CALC): 101 mg/dL (ref <130)
Triglycerides: 93 mg/dL (ref <150)

## 2017-10-29 LAB — TSH: TSH: 0.7 m[IU]/L (ref 0.40–4.50)

## 2017-10-29 MED ORDER — PRAVASTATIN SODIUM 40 MG PO TABS: 20.0000 mg | ORAL_TABLET | Freq: Every day | ORAL | 3 refills | Status: DC

## 2017-10-29 NOTE — Patient Instructions (Signed)
We will try cutting back on cholesterol medication - cut pravastatin in 1/2 for 20 mg daily, continue to watch diet  Recommend continuing to exercise as you can - "what you don't use, you lose."  Take lasix AS NEEDED for swelling. Can try 1/2 tab (20 mg) and see if this is effective - cut down on potassium accordingly when not taking.     Preventing High Cholesterol Cholesterol is a waxy, fat-like substance that your body needs in small amounts. Your liver makes all the cholesterol that your body needs. Having high cholesterol (hypercholesterolemia) increases your risk for heart disease and stroke. Extra (excess) cholesterol comes from the food you eat, such as animal-based fat (saturated fat) from meat and some dairy products. High cholesterol can often be prevented with diet and lifestyle changes. If you already have high cholesterol, you can control it with diet and lifestyle changes, as well as medicine. What nutrition changes can be made?  Eat less saturated fat. Foods that contain saturated fat include red meat and some dairy products.  Avoid processed meats, like bacon and lunch meats.  Avoid trans fats, which are found in margarine and some baked goods.  Avoid foods and beverages that have added sugars.  Eat more fruits, vegetables, and whole grains.  Choose healthy sources of protein, such as fish, poultry, and nuts.  Choose healthy sources of fat, such as: ? Nuts. ? Vegetable oils, especially olive oil. ? Fish that have healthy fats (omega-3 fatty acids), such as mackerel or salmon. What lifestyle changes can be made?  Lose weight if you are overweight. Losing 5-10 lb (2.3-4.5 kg) can help prevent or control high cholesterol and reduce your risk for diabetes and high blood pressure. Ask your health care provider to help you with a diet and exercise plan to safely lose weight.  Get enough exercise. Do at least 150 minutes of moderate-intensity exercise each week. ? You  could do this in short exercise sessions several times a day, or you could do longer exercise sessions a few times a week. For example, you could take a brisk 10-minute walk or bike ride, 3 times a day, for 5 days a week.  Do not smoke. If you need help quitting, ask your health care provider.  Limit your alcohol intake. If you drink alcohol, limit alcohol intake to no more than 1 drink a day for nonpregnant women and 2 drinks a day for men. One drink equals 12 oz of beer, 5 oz of wine, or 1 oz of hard liquor. Why are these changes important? If you have high cholesterol, deposits (plaques) may build up on the walls of your blood vessels. Plaques make the arteries narrower and stiffer, which can restrict or block blood flow and cause blood clots to form. This greatly increases your risk for heart attack and stroke. Making diet and lifestyle changes can reduce your risk for these life-threatening conditions. What can I do to lower my risk?  Manage your risk factors for high cholesterol. Talk with your health care provider about all of your risk factors and how to lower your risk.  Manage other conditions that you have, such as diabetes or high blood pressure (hypertension).  Have your cholesterol checked at regular intervals.  Keep all follow-up visits as told by your health care provider. This is important. How is this treated? In addition to diet and lifestyle changes, your health care provider may recommend medicines to help lower cholesterol, such as a medicine to  reduce the amount of cholesterol made in your liver. You may need medicine if:  Diet and lifestyle changes do not lower your cholesterol enough.  You have high cholesterol and other risk factors for heart disease or stroke.  Take over-the-counter and prescription medicines only as told by your health care provider. Where to find more information:  American Heart Association:  ThisTune.com.pt.jsp  National Heart, Lung, and Blood Institute: FrenchToiletries.com.cy Summary  High cholesterol increases your risk for heart disease and stroke. By keeping your cholesterol level low, you can reduce your risk for these conditions.  Diet and lifestyle changes are the most important steps in preventing high cholesterol.  Work with your health care provider to manage your risk factors, and have your blood tested regularly. This information is not intended to replace advice given to you by your health care provider. Make sure you discuss any questions you have with your health care provider. Document Released: 09/04/2015 Document Revised: 04/28/2016 Document Reviewed: 04/28/2016 Elsevier Interactive Patient Education  [name].

## 2017-11-19 ENCOUNTER — Other Ambulatory Visit: Payer: Self-pay | Admitting: *Deleted

## 2017-11-19 MED ORDER — PRAVASTATIN SODIUM 40 MG PO TABS: ORAL_TABLET | ORAL | 3 refills | Status: DC

## 2017-12-05 DIAGNOSIS — L905 Scar conditions and fibrosis of skin: Secondary | ICD-10-CM | POA: Diagnosis not present

## 2017-12-05 DIAGNOSIS — L814 Other melanin hyperpigmentation: Secondary | ICD-10-CM | POA: Diagnosis not present

## 2017-12-05 DIAGNOSIS — D485 Neoplasm of uncertain behavior of skin: Secondary | ICD-10-CM | POA: Diagnosis not present

## 2017-12-05 DIAGNOSIS — L570 Actinic keratosis: Secondary | ICD-10-CM | POA: Diagnosis not present

## 2017-12-05 DIAGNOSIS — L821 Other seborrheic keratosis: Secondary | ICD-10-CM | POA: Diagnosis not present

## 2017-12-05 DIAGNOSIS — L722 Steatocystoma multiplex: Secondary | ICD-10-CM | POA: Diagnosis not present

## 2017-12-05 DIAGNOSIS — Z85828 Personal history of other malignant neoplasm of skin: Secondary | ICD-10-CM | POA: Diagnosis not present

## 2017-12-05 DIAGNOSIS — D225 Melanocytic nevi of trunk: Secondary | ICD-10-CM | POA: Diagnosis not present

## 2018-01-06 ENCOUNTER — Other Ambulatory Visit: Payer: Self-pay | Admitting: Internal Medicine

## 2018-01-06 DIAGNOSIS — G4700 Insomnia, unspecified: Secondary | ICD-10-CM

## 2018-01-14 DIAGNOSIS — H2513 Age-related nuclear cataract, bilateral: Secondary | ICD-10-CM | POA: Diagnosis not present

## 2018-01-21 ENCOUNTER — Encounter: Payer: Self-pay | Admitting: Adult Health

## 2018-01-21 ENCOUNTER — Ambulatory Visit (INDEPENDENT_AMBULATORY_CARE_PROVIDER_SITE_OTHER): Payer: Medicare Other | Admitting: Adult Health

## 2018-01-21 DIAGNOSIS — J209 Acute bronchitis, unspecified: Secondary | ICD-10-CM

## 2018-01-21 MED ORDER — ALBUTEROL SULFATE 108 (90 BASE) MCG/ACT IN AEPB: 2.0000 | INHALATION_SPRAY | Freq: Four times a day (QID) | RESPIRATORY_TRACT | 0 refills | Status: DC | PRN

## 2018-01-21 MED ORDER — PREDNISONE 20 MG PO TABS: ORAL_TABLET | ORAL | 0 refills | Status: DC

## 2018-01-21 MED ORDER — AZITHROMYCIN 250 MG PO TABS: ORAL_TABLET | ORAL | 1 refills | Status: AC

## 2018-01-21 MED ORDER — PROMETHAZINE-DM 6.25-15 MG/5ML PO SYRP: 5.0000 mL | ORAL_SOLUTION | Freq: Four times a day (QID) | ORAL | 1 refills | Status: DC | PRN

## 2018-01-21 NOTE — Progress Notes (Signed)
Assessment and Plan:  Timothy Vaughn was seen today for cough.  Diagnoses and all orders for this visit:  Acute bronchitis, unspecified organism Recommended he start on daily allergy medication - allegra or zyrtec recommended, advised to start 2-3 weeks before typically has problems next year and contrinue through spring season Follow up as needed.  -     predniSONE (DELTASONE) 20 MG tablet; 2 tablets daily for 3 days, 1 tablet daily for 4 days. -     azithromycin (ZITHROMAX) 250 MG tablet; Take 2 tablets (500 mg) on  Day 1,  followed by 1 tablet (250 mg) once daily on Days 2 through 5. -     promethazine-dextromethorphan (PROMETHAZINE-DM) 6.25-15 MG/5ML syrup; Take 5 mLs by mouth 4 (four) times daily as needed for cough.  Fill and use as needed for any dyspnea/wheezing:  -     Albuterol Sulfate (PROAIR RESPICLICK) 160 (90 Base) MCG/ACT AEPB; Inhale 2 Act into the lungs every 6 (six) hours as needed (for shortness of breath).  The patient was advised to call immediately if he has any concerning symptoms in the interval. The patient voices understanding of current treatment options and is in agreement with the current care plan.The patient knows to call the clinic with any problems, questions or concerns or go to the ER if any further progression of symptoms.    Further disposition pending results of labs. Discussed med's effects and SE's.   Over 15 minutes of exam, counseling, chart review, and critical decision making was performed.   Future Appointments  Date Time Provider [place]  01/30/2018 10:30 AM Unk Pinto, MD [facility] None  08/05/2018 11:00 AM Unk Pinto, MD [facility] None    ------------------------------------------------------------------------------------------------------------------   HPI BP 110/62   Pulse 61   Temp (!) 97.3 F (36.3 C)   Ht 5\' 4"  (1.626 m)   Wt 133 lb (60.3 kg)   SpO2 98%   BMI 22.83 kg/m   84 y.o.male with restrictive lung  disease secondary to kyphoscoliosis presents for increasingly productive cough, chest tightness with cough, "feeling real bad," mild body aches, fatigue (hasn't been sleeping); denies fever/chills, headache, dizziness, wheezing. He endorses some dyspnea at baseline, reports no worse than usual. He speaks in complete sentences today without any acute distress. Ambulates without difficulty.   He has not taken any medications for this bout. + hx of recurrent acute bronchitis. Reports allergy symptoms annually for which he does not currently take medication.   Past Medical History:  Diagnosis Date  . Cancer [facility])    prostate  . Hyperlipidemia   . Hypertension   . Pre-diabetes   . Vitamin D deficiency      Allergies  Allergen Reactions  . Lipitor [Atorvastatin] Other (See Comments)    Severe NIV  . Niaspan [Niacin Er] Itching    Current Outpatient Medications on File Prior to Visit  Medication Sig  . Albuterol Sulfate (PROAIR RESPICLICK) 109 (90 BASE) MCG/ACT AEPB Inhale 2 Act into the lungs every 6 (six) hours as needed (for shortness of breath).  . Ascorbic Acid (VITAMIN C) 100 MG tablet Take 100 mg by mouth daily.  Marland Kitchen aspirin 81 MG tablet Take 81 mg by mouth daily.  . Cholecalciferol (VITAMIN D PO) Take 8,000 Int'l Units by mouth daily.   . furosemide (LASIX) 40 MG tablet TAKE 1 TABLET (40 MG TOTAL) BY MOUTH DAILY AS NEEDED FOR FLUID OR EDEMA.  . Multiple Vitamin (MULTI VITAMIN DAILY PO) Take 1 tablet by mouth daily.   Marland Kitchen  Omega-3 Fatty Acids (FISH OIL PO) Take 1 capsule by mouth daily.   . potassium chloride SA (K-DUR,KLOR-CON) 20 MEQ tablet TAKE 2 TABLETS BY MOUTH DAILY.  . pravastatin (PRAVACHOL) 40 MG tablet Take 1/2 to tablet daily as directed.  . traZODone (DESYREL) 150 MG tablet TAKE 1/3 TO 1/2 TO 1 TABLET ABOUT 1 HOUR BEFORE SLEEP   No current facility-administered medications on file prior to visit.     ROS: Review of Systems  Constitutional: Positive for malaise/fatigue.  Negative for chills, diaphoresis and fever.  HENT: Positive for congestion. Negative for ear discharge, ear pain, hearing loss, sinus pain, sore throat and tinnitus.   Eyes: Negative for blurred vision, pain, discharge and redness.  Respiratory: Positive for cough, sputum production and shortness of breath (Consistent with baseline). Negative for hemoptysis, wheezing and stridor.   Cardiovascular: Negative for chest pain, palpitations and orthopnea.  Gastrointestinal: Negative for abdominal pain, diarrhea, nausea and vomiting.  Genitourinary: Negative.   Musculoskeletal: Positive for myalgias. Negative for joint pain.  Skin: Negative for rash.  Neurological: Negative for dizziness, sensory change, weakness and headaches.  Endo/Heme/Allergies: Positive for environmental allergies.  Psychiatric/Behavioral: Negative.   All other systems reviewed and are negative.   Physical Exam:  BP 110/62   Pulse 61   Temp (!) 97.3 F (36.3 C)   Ht 5\' 4"  (1.626 m)   Wt 133 lb (60.3 kg)   SpO2 98%   BMI 22.83 kg/m   General Appearance: Well nourished, in no apparent distress. Eyes: PERRLA, EOMs, conjunctiva no swelling or erythema Sinuses: No Frontal/maxillary tenderness ENT/Mouth: Ext aud canals clear, TMs without erythema, bulging. No erythema, swelling, or exudate on post pharynx.  Tonsils not swollen or erythematous. Hearing normal.  Neck: Supple, thyroid normal.  Respiratory: Respiratory effort normal, expansion unequal/limited by advanced scoliosis, BS equal bilaterally with mild scattered rhonchi without rales, wheezing or stridor.  Cardio: RRR with no MRGs. Brisk peripheral pulses without edema.  Abdomen: Soft, + BS.Marland Kitchen Lymphatics: Non tender without lymphadenopathy.  Musculoskeletal: Advanced scoliosis of upper/mid back, Symmetrical strength, normal slow gait.  Skin: Warm, dry without rashes, lesions, ecchymosis.  Neuro: Cranial nerves intact. Normal muscle tone, no cerebellar symptoms.  Sensation intact.  Psych: Awake and oriented X 3, normal affect, Insight and Judgment appropriate.     Izora Ribas, NP 3:54 PM [facility]

## 2018-01-21 NOTE — Patient Instructions (Signed)
Start on a allergy medication - recommend allegra, zyrtec or generic equivalent    HOW TO TREAT VIRAL COUGH AND COLD SYMPTOMS:  -Symptoms usually last at least 1 week with the worst symptoms being around day 4.  - colds usually start with a sore throat and end with a cough, and the cough can take 2 weeks to get better.  -No antibiotics are needed for colds, flu, sore throats, cough, bronchitis UNLESS symptoms are longer than 7 days OR if you are getting better then get drastically worse.  -There are a lot of combination medications (Dayquil, Nyquil, Vicks 44, tyelnol cold and sinus, ETC). Please look at the ingredients on the back so that you are treating the correct symptoms and not doubling up on medications/ingredients.    Medicines you can use  Nasal congestion  Little Remedies saline spray (aerosol/mist)- can try this, it is in the kids section - pseudoephedrine (Sudafed)- behind the counter, do not use if you have high blood pressure, medicine that have -D in them.  - phenylephrine (Sudafed PE) -Dextormethorphan + chlorpheniramine (Coridcidin HBP)- okay if you have high blood pressure -Oxymetazoline (Afrin) nasal spray- LIMIT to 3 days -Saline nasal spray -Neti pot (used distilled or bottled water)  Ear pain/congestion  -pseudoephedrine (sudafed) - Nasonex/flonase nasal spray  Fever  -Acetaminophen (Tyelnol) -Ibuprofen (Advil, motrin, aleve)  Sore Throat  -Acetaminophen (Tyelnol) -Ibuprofen (Advil, motrin, aleve) -Drink a lot of water -Gargle with salt water - Rest your voice (don't talk) -Throat sprays -Cough drops  Body Aches  -Acetaminophen (Tyelnol) -Ibuprofen (Advil, motrin, aleve)  Headache  -Acetaminophen (Tyelnol) -Ibuprofen (Advil, motrin, aleve) - Exedrin, Exedrin Migraine  Allergy symptoms (cough, sneeze, runny nose, itchy eyes) -Claritin or loratadine cheapest but likely the weakest  -Zyrtec or certizine at night because it can make you sleepy -The  strongest is allegra or fexafinadine  Cheapest at [employer], [employer], [employer]  Cough  -Dextromethorphan (Delsym)- medicine that has DM in it -Guafenesin (Mucinex/Robitussin) - cough drops - drink lots of water  Chest Congestion  -Guafenesin (Mucinex/Robitussin)  Red Itchy Eyes  - Naphcon-A  Upset Stomach  - Bland diet (nothing spicy, greasy, fried, and high acid foods like tomatoes, oranges, berries) -OKAY- cereal, bread, soup, crackers, rice -Eat smaller more frequent meals -reduce caffeine, no alcohol -Loperamide (Imodium-AD) if diarrhea -Prevacid for heart burn  General health when sick  -Hydration -wash your hands frequently -keep surfaces clean -change pillow cases and sheets often -Get fresh air but do not exercise strenuously -Vitamin D, double up on it - Vitamin C -Zinc

## 2018-01-30 ENCOUNTER — Encounter: Payer: Self-pay | Admitting: Internal Medicine

## 2018-01-30 ENCOUNTER — Ambulatory Visit (INDEPENDENT_AMBULATORY_CARE_PROVIDER_SITE_OTHER): Payer: Medicare Other | Admitting: Internal Medicine

## 2018-01-30 VITALS — BP 124/68 | HR 80 | Temp 97.9°F | Resp 16 | Ht 64.0 in | Wt 129.0 lb

## 2018-01-30 DIAGNOSIS — M419 Scoliosis, unspecified: Secondary | ICD-10-CM | POA: Diagnosis not present

## 2018-01-30 DIAGNOSIS — Z79899 Other long term (current) drug therapy: Secondary | ICD-10-CM | POA: Diagnosis not present

## 2018-01-30 DIAGNOSIS — I10 Essential (primary) hypertension: Secondary | ICD-10-CM

## 2018-01-30 DIAGNOSIS — J984 Other disorders of lung: Secondary | ICD-10-CM | POA: Diagnosis not present

## 2018-01-30 DIAGNOSIS — R7303 Prediabetes: Secondary | ICD-10-CM | POA: Diagnosis not present

## 2018-01-30 DIAGNOSIS — E782 Mixed hyperlipidemia: Secondary | ICD-10-CM | POA: Diagnosis not present

## 2018-01-30 DIAGNOSIS — R7309 Other abnormal glucose: Secondary | ICD-10-CM | POA: Diagnosis not present

## 2018-01-30 DIAGNOSIS — E559 Vitamin D deficiency, unspecified: Secondary | ICD-10-CM | POA: Diagnosis not present

## 2018-01-30 NOTE — Progress Notes (Signed)
This very nice 84 y.o. MWM presents for 6 month follow up with HTN, HLD, Pre-Diabetes and Vitamin D Deficiency. Patient has severe Restrictive Lung Disease from severe Thoracolumbar  Rotoscoliosis.     Patient is treated for HTN (2004)  & BP has been controlled at home. Today's BP is at goal - 124/68. Patient has had no complaints of any cardiac type chest pain, palpitations, dyspnea / orthopnea / PND, dizziness, claudication, or dependent edema.     Hyperlipidemia is controlled with diet & meds. Patient denies myalgias or other med SE's. Last Lipids were at goal: Lab Results  Component Value Date   CHOL 156 10/29/2017   HDL 55 10/29/2017   LDLCALC 82 10/29/2017   TRIG 93 10/29/2017   CHOLHDL 2.8 10/29/2017      Also, the patient has history of PreDiabetes (A1c 5.8%/2012  and 6.0%/2016) and has had no symptoms of reactive hypoglycemia, diabetic polys, paresthesias or visual blurring.  Last A1c was Normal & at goal: Lab Results  Component Value Date   HGBA1C 5.5 07/18/2017      Further, the patient also has history of Vitamin D Deficiency ("35"/2008) and supplements vitamin D without any suspected side-effects. Last vitamin D was at goal:  Lab Results  Component Value Date   VD25OH 78 07/18/2017   Current Outpatient Medications on File Prior to Visit  Medication Sig  . Albuterol Sulfate (PROAIR RESPICLICK) 161 (90 Base) MCG/ACT AEPB Inhale 2 Act into the lungs every 6 (six) hours as needed (for shortness of breath).  . Ascorbic Acid (VITAMIN C) 100 MG tablet Take 100 mg by mouth daily.  Marland Kitchen aspirin 81 MG tablet Take 81 mg by mouth daily.  . Cholecalciferol (VITAMIN D PO) Take 8,000 Int'l Units by mouth daily.   . furosemide (LASIX) 40 MG tablet TAKE 1 TABLET (40 MG TOTAL) BY MOUTH DAILY AS NEEDED FOR FLUID OR EDEMA.  . Multiple Vitamin (MULTI VITAMIN DAILY PO) Take 1 tablet by mouth daily.   . Omega-3 Fatty Acids (FISH OIL PO) Take 1 capsule by mouth daily.   . potassium chloride  SA (K-DUR,KLOR-CON) 20 MEQ tablet TAKE 2 TABLETS BY MOUTH DAILY.  . pravastatin (PRAVACHOL) 40 MG tablet Take 1/2 to tablet daily as directed.   No current facility-administered medications on file prior to visit.    Allergies  Allergen Reactions  . Lipitor [Atorvastatin] Other (See Comments)    Severe NIV  . Niaspan [Niacin Er] Itching   PMHx:   Past Medical History:  Diagnosis Date  . Cancer [facility])    prostate  . Hyperlipidemia   . Hypertension   . Pre-diabetes   . Vitamin D deficiency    Immunization History  Administered Date(s) Administered  . DTaP 09/03/2005  . Influenza Whole 05/20/2013  . Influenza, High Dose Seasonal PF 06/04/2014, 06/09/2015, 05/03/2016, 05/28/2017  . Pneumococcal Conjugate-13 10/21/2015  . Pneumococcal Polysaccharide-23 02/05/2012  . Td 03/04/2017  . Zoster 09/25/2005   Past Surgical History:  Procedure Laterality Date  . PROSTATECTOMY  2001   FHx:    Reviewed / unchanged  SHx:    Reviewed / unchanged   Systems Review:  Constitutional: Denies fever, chills, wt changes, headaches, insomnia, fatigue, night sweats, change in appetite. Eyes: Denies redness, blurred vision, diplopia, discharge, itchy, watery eyes.  ENT: Denies discharge, congestion, post nasal drip, epistaxis, sore throat, earache, hearing loss, dental pain, tinnitus, vertigo, sinus pain, snoring.  CV: Denies chest pain, palpitations, irregular heartbeat, syncope,  dyspnea, diaphoresis, orthopnea, PND, claudication or edema. Respiratory: denies cough, dyspnea, DOE, pleurisy, hoarseness, laryngitis, wheezing.  Gastrointestinal: Denies dysphagia, odynophagia, heartburn, reflux, water brash, abdominal pain or cramps, nausea, vomiting, bloating, diarrhea, constipation, hematemesis, melena, hematochezia  or hemorrhoids. Genitourinary: Denies dysuria, frequency, urgency, nocturia, hesitancy, discharge, hematuria or flank pain. Musculoskeletal: Denies arthralgias, myalgias, stiffness, jt.  swelling, pain, limping or strain/sprain.  Skin: Denies pruritus, rash, hives, warts, acne, eczema or change in skin lesion(s). Neuro: No weakness, tremor, incoordination, spasms, paresthesia or pain. Psychiatric: Denies confusion, memory loss or sensory loss. Endo: Denies change in weight, skin or hair change.  Heme/Lymph: No excessive bleeding, bruising or enlarged lymph nodes.  Physical Exam  BP 124/68   Pulse 80   Temp 97.9 F (36.6 C)   Resp 16   Ht 5\' 4"  (1.626 m)   Wt 129 lb (58.5 kg)   BMI 22.14 kg/m   Appears  well nourished, well groomed  and in no distress.  Eyes: PERRLA, EOMs, conjunctiva no swelling or erythema. Sinuses: No frontal/maxillary tenderness ENT/Mouth: EAC's clear, TM's nl w/o erythema, bulging. Nares clear w/o erythema, swelling, exudates. Oropharynx clear without erythema or exudates. Oral hygiene is good. Tongue normal, non obstructing. Hearing intact.  Neck: Supple. Thyroid not palpable. Car 2+/2+ without bruits, nodes or JVD. Chest: Respirations nl with BS clear & equal w/o rales, rhonchi, wheezing or stridor.  Cor: Heart sounds normal w/ regular rate and rhythm without sig. murmurs, gallops, clicks or rubs. Peripheral pulses normal and equal  without edema.  Abdomen: Soft & bowel sounds normal. Non-tender w/o guarding, rebound, hernias, masses or organomegaly.  Lymphatics: Unremarkable.  Musculoskeletal: Full ROM all peripheral extremities, joint stability, 5/5 strength and normal gait.  Skin: Warm, dry without exposed rashes, lesions or ecchymosis apparent.  Neuro: Cranial nerves intact, reflexes equal bilaterally. Sensory-motor testing grossly intact. Tendon reflexes grossly intact.  Pysch: Alert & oriented x 3.  Insight and judgement nl & appropriate. No ideations.  Assessment and Plan:  1. Essential hypertension  - Continue medication, monitor blood pressure at home.  - Continue DASH diet.  Reminder to go to the ER if any CP,  SOB, nausea,  dizziness, severe HA, changes vision/speech.  - CBC with Differential/Platelet - COMPLETE METABOLIC PANEL WITH GFR - Magnesium - TSH  2. Hyperlipidemia, mixed  - Continue diet/meds, exercise,& lifestyle modifications.  - Continue monitor periodic cholesterol/liver & renal functions   - Lipid panel - TSH  3. Abnormal glucose  - Continue diet, exercise, lifestyle modifications.  - Monitor appropriate labs.  - Hemoglobin A1c - Insulin, random  4. Vitamin D deficiency  - Continue supplementation.  - VITAMIN D 25 Hydroxyl  5. Prediabetes  - Hemoglobin A1c - Insulin, random  6. Restrictive lung disease due to kyphoscoliosis   7. Medication management  - CBC with Differential/Platelet - COMPLETE METABOLIC PANEL WITH GFR - Magnesium - Lipid panel - TSH - Hemoglobin A1c - Insulin, random - VITAMIN D 25 Hydroxyl          Discussed  regular exercise, BP monitoring, weight control to achieve/maintain BMI less than 25 and discussed med and SE's. Recommended labs to assess and monitor clinical status with further disposition pending results of labs. Over 30 minutes of exam, counseling, chart review was performed.

## 2018-01-30 NOTE — Patient Instructions (Signed)

## 2018-01-31 LAB — COMPLETE METABOLIC PANEL WITH GFR
AG Ratio: 1.6 (calc) (ref 1.0–2.5)
ALT: 16 U/L (ref 9–46)
AST: 18 U/L (ref 10–35)
Albumin: 3.8 g/dL (ref 3.6–5.1)
Alkaline phosphatase (APISO): 63 U/L (ref 40–115)
BILIRUBIN TOTAL: 0.9 mg/dL (ref 0.2–1.2)
BUN: 24 mg/dL (ref 7–25)
CHLORIDE: 99 mmol/L (ref 98–110)
CO2: 37 mmol/L — ABNORMAL HIGH (ref 20–32)
Calcium: 9.2 mg/dL (ref 8.6–10.3)
Creat: 0.81 mg/dL (ref 0.70–1.11)
GFR, Est African American: 93 mL/min/{1.73_m2} (ref >=60)
GFR, Est Non African American: 80 mL/min/{1.73_m2} (ref >=60)
GLUCOSE: 72 mg/dL (ref 65–99)
Globulin: 2.4 g/dL (calc) (ref 1.9–3.7)
Potassium: 5.4 mmol/L — ABNORMAL HIGH (ref 3.5–5.3)
Sodium: 142 mmol/L (ref 135–146)
Total Protein: 6.2 g/dL (ref 6.1–8.1)

## 2018-01-31 LAB — CBC WITH DIFFERENTIAL/PLATELET
BASOS ABS: 19 {cells}/uL (ref 0–200)
Basophils Relative: 0.2 %
EOS ABS: 28 {cells}/uL (ref 15–500)
Eosinophils Relative: 0.3 %
HCT: 42.9 % (ref 38.5–50.0)
Hemoglobin: 14.7 g/dL (ref 13.2–17.1)
Lymphs Abs: 1572 cells/uL (ref 850–3900)
MCH: 32.7 pg (ref 27.0–33.0)
MCHC: 34.3 g/dL (ref 32.0–36.0)
MCV: 95.3 fL (ref 80.0–100.0)
MONOS PCT: 8.3 %
MPV: 10.5 fL (ref 7.5–12.5)
Neutro Abs: 6910 cells/uL (ref 1500–7800)
Neutrophils Relative %: 74.3 %
PLATELETS: 194 10*3/uL (ref 140–400)
RBC: 4.5 10*6/uL (ref 4.20–5.80)
RDW: 11.8 % (ref 11.0–15.0)
TOTAL LYMPHOCYTE: 16.9 %
WBC mixed population: 772 cells/uL (ref 200–950)
WBC: 9.3 10*3/uL (ref 3.8–10.8)

## 2018-01-31 LAB — VITAMIN D 25 HYDROXY (VIT D DEFICIENCY, FRACTURES): Vit D, 25-Hydroxy: 74 ng/mL (ref 30–100)

## 2018-01-31 LAB — LIPID PANEL
CHOLESTEROL: 180 mg/dL (ref <200)
HDL: 67 mg/dL (ref >40)
LDL Cholesterol (Calc): 98 mg/dL (calc)
Non-HDL Cholesterol (Calc): 113 mg/dL (calc) (ref <130)
Total CHOL/HDL Ratio: 2.7 (calc) (ref <5.0)
Triglycerides: 66 mg/dL (ref <150)

## 2018-01-31 LAB — HEMOGLOBIN A1C
Hgb A1c MFr Bld: 5.8 % of total Hgb — ABNORMAL HIGH (ref <5.7)
Mean Plasma Glucose: 120 (calc)
eAG (mmol/L): 6.6 (calc)

## 2018-01-31 LAB — MAGNESIUM: MAGNESIUM: 2 mg/dL (ref 1.5–2.5)

## 2018-01-31 LAB — INSULIN, RANDOM: Insulin: 6.1 u[IU]/mL (ref 2.0–19.6)

## 2018-01-31 LAB — TSH: TSH: 0.63 m[IU]/L (ref 0.40–4.50)

## 2018-03-11 DIAGNOSIS — D1801 Hemangioma of skin and subcutaneous tissue: Secondary | ICD-10-CM | POA: Diagnosis not present

## 2018-03-11 DIAGNOSIS — L821 Other seborrheic keratosis: Secondary | ICD-10-CM | POA: Diagnosis not present

## 2018-03-11 DIAGNOSIS — Z85828 Personal history of other malignant neoplasm of skin: Secondary | ICD-10-CM | POA: Diagnosis not present

## 2018-03-11 DIAGNOSIS — L570 Actinic keratosis: Secondary | ICD-10-CM | POA: Diagnosis not present

## 2018-05-06 ENCOUNTER — Ambulatory Visit: Payer: Self-pay | Admitting: Adult Health

## 2018-05-08 NOTE — Progress Notes (Signed)
Patient ID: Timothy Vaughn, male   DOB: [birth year], 84 y.o.   MRN: [id number]  MEDICARE ANNUAL WELLNESS VISIT AND 3 MONTH OV  Assessment:   Medicare annual wellness visit  Essential hypertension - continue medications, DASH diet, exercise and monitor at home. Call if greater than 130/80.  -     CBC with Differential/Platelet -     CMP/GFR -     TSH  Moderate tricuspid regurgitation Monitor  Restrictive lung disease due to kyphoscoliosis Monitor for infection, continue deep breaths, follows pulmonary  Prediabetes Discussed disease and risks Discussed diet/exercise, weight management  A1C  Kyphoscoliosis monitor  Hyperlipidemia -continue medications, check lipids, decrease fatty foods, increase activity.  -     Lipid panel  Vitamin D deficiency At goal at recent check; continue to recommend supplementation for goal of 70-100 Defer vitamin D level  Medication management -     Magnesium  Macrocytosis without anemia - monitor, continue iron supp with Vitamin C and increase green leafy veggies -     CBC with Differential/Platelet   Future Appointments  Date Time Provider [place]  08/05/2018 11:00 AM Unk Pinto, MD [facility] None     Plan:   During the course of the visit the patient was educated and counseled about appropriate screening and preventive services including:    Pneumococcal vaccine   Influenza vaccine  Td vaccine  Screening electrocardiogram  Bone densitometry screening  Colorectal cancer screening  Diabetes screening  Glaucoma screening  Nutrition counseling   Advanced directives: requested   Subjective:  Timothy Vaughn is a 84 y.o. male who presents for Medicare Annual Wellness Visit and 6 month OV follow up.  He has severe kyphoscoliosis with consequent restrictive lung disease, followed by pulmonology, has had normal echo with moderate TR.   He reports his wife's health is declining and cannot be left  alone, used to golf, etc but hasn't been able to.   BMI is Body mass index is 21.97 kg/m., he has not been working on diet and exercise. Wt Readings from Last 3 Encounters:  05/12/18 128 lb (58.1 kg)  01/30/18 129 lb (58.5 kg)  01/21/18 133 lb (60.3 kg)   He has had elevated blood pressure since 2004. His blood pressure has been controlled at home, today their BP is BP: 128/72 He does workout. He denies chest pain, dizziness.   He is on cholesterol medication (pravastatin 40 mg daily) and denies myalgias. His cholesterol is neart  goal. The cholesterol last visit was:   Lab Results  Component Value Date   CHOL 180 01/30/2018   HDL 67 01/30/2018   LDLCALC 98 01/30/2018   TRIG 66 01/30/2018   CHOLHDL 2.7 01/30/2018   He has had history of prediabetes with an A1C in the DM range one time Jan 2017  He has been working on diet for preDM, he is on bASA, he is not on ACE/ARB due to hypotension and denies foot ulcerations, hyperglycemia, hypoglycemia , increased appetite, nausea, paresthesia of the feet, polydipsia, polyuria and visual disturbances. Last A1C in the office was:  Lab Results  Component Value Date   HGBA1C 5.8 (H) 01/30/2018   Lab Results  Component Value Date   GFRNONAA 80 01/30/2018   Patient is on Vitamin D supplement.   Lab Results  Component Value Date   VD25OH 74 01/30/2018        Medication Review: Current Outpatient Medications on File Prior to Visit  Medication Sig  . Albuterol  Sulfate (PROAIR RESPICLICK) 314 (90 Base) MCG/ACT AEPB Inhale 2 Act into the lungs every 6 (six) hours as needed (for shortness of breath).  . Ascorbic Acid (VITAMIN C) 100 MG tablet Take 100 mg by mouth daily.  Marland Kitchen aspirin 81 MG tablet Take 81 mg by mouth daily.  . Cholecalciferol (VITAMIN D PO) Take 8,000 Int'l Units by mouth daily.   . furosemide (LASIX) 40 MG tablet TAKE 1 TABLET (40 MG TOTAL) BY MOUTH DAILY AS NEEDED FOR FLUID OR EDEMA.  . Multiple Vitamin (MULTI VITAMIN DAILY  PO) Take 1 tablet by mouth daily.   . Omega-3 Fatty Acids (FISH OIL PO) Take 1 capsule by mouth daily.   . potassium chloride SA (K-DUR,KLOR-CON) 20 MEQ tablet TAKE 2 TABLETS BY MOUTH DAILY.  . pravastatin (PRAVACHOL) 40 MG tablet Take 1/2 to tablet daily as directed.   No current facility-administered medications on file prior to visit.     Current Problems (verified) Patient Active Problem List   Diagnosis Date Noted  . Moderate tricuspid regurgitation 09/08/2015  . Macrocytosis without anemia 09/08/2015  . Encounter for Medicare annual wellness exam 04/12/2015  . Medication management 02/10/2014  . Essential hypertension 08/14/2013  . Hyperlipidemia 08/14/2013  . Prediabetes 08/14/2013  . Vitamin D deficiency 08/14/2013  . Kyphoscoliosis 08/14/2013  . Restrictive lung disease due to kyphoscoliosis 08/14/2013   Screening Tests Immunization History  Administered Date(s) Administered  . DTaP 09/03/2005  . Influenza Whole 05/20/2013  . Influenza, High Dose Seasonal PF 06/04/2014, 06/09/2015, 05/03/2016, 05/28/2017  . Pneumococcal Conjugate-13 10/21/2015  . Pneumococcal Polysaccharide-23 02/05/2012  . Td 03/04/2017  . Zoster 09/25/2005   Preventative care: Last colonoscopy: 2007 declines due to age Echo 2017 PFTs 09/2015 CT AB 08/2010  Prior vaccinations: Tdap: 2007, 2018  Influenza: 05/2017 Pneumococcal: 02/2012 prevnar 13: 2017 Shingles/Zostavax: 09/2005  Names of Other Physician/Practitioners you currently use: 1. [facility] here for primary care 2. Dr. Augusto Gamble, eye doctor, last visit 2019 3. Dr. Karma Lew, dentist, last visit 2019  Patient Care Team: Unk Pinto, MD as PCP - General (Internal Medicine) Inda Castle, MD (Inactive) as Consulting Physician (Gastroenterology) Carolan Clines, MD as Consulting Physician (Urology)   History reviewed: allergies, current medications, past family history, past medical  history, past social history, past surgical history and problem list  Allergies Allergies  Allergen Reactions  . Lipitor [Atorvastatin] Other (See Comments)    Severe NIV  . Niaspan [Niacin Er] Itching    SURGICAL HISTORY He  has a past surgical history that includes Prostatectomy (2001). FAMILY HISTORY His family history includes Alzheimer's disease in his father; COPD in his mother; Cancer in his brother and father; Diabetes in his maternal grandfather; Heart disease in his father and mother; Stroke in his maternal grandfather. SOCIAL HISTORY He  reports that he has never smoked. He has never used smokeless tobacco. He reports that he does not drink alcohol or use drugs.  MEDICARE WELLNESS OBJECTIVES: Physical activity: Current Exercise Habits: The patient does not participate in regular exercise at present(Too busy caring for wife, cannot leave her alone), Exercise limited by: None identified Cardiac risk factors: Cardiac Risk Factors include: advanced age (>50men, >69 women);male gender;hypertension;dyslipidemia Depression/mood screen:   Depression screen [facility] 2/9 05/12/2018  Decreased Interest 0  Down, Depressed, Hopeless 0  PHQ - 2 Score 0    ADLs:  In your present state of health, do you have any difficulty performing the following activities: 05/12/2018 01/30/2018  Hearing? Aggie Moats  Comment has number to call, hasn't followed through due to wife's health -  Vision? N N  Difficulty concentrating or making decisions? N N  Walking or climbing stairs? N N  Dressing or bathing? N N  Doing errands, shopping? N N  Some recent data might be hidden     Cognitive Testing  Alert? Yes  Normal Appearance?Yes  Oriented to person? Yes  Place? Yes   Time? Yes  Recall of three objects?  Yes  Can perform simple calculations? Yes  Displays appropriate judgment?Yes  Can read the correct time from a watch face?Yes  EOL planning: Does Patient Have a Medical Advance Directive?: No Would  patient like information on creating a medical advance directive?: No - Patient declined   Objective:     Blood pressure 128/72, pulse 67, temperature 97.7 F (36.5 C), height 5\' 4"  (1.626 m), weight 128 lb (58.1 kg), SpO2 93 %. Body mass index is 21.97 kg/m.  General appearance: alert, no distress, WD/WN, male Eyes: PERRLA, EOMs, conjunctiva no swelling or erythema Sinuses: No Frontal/maxillary tenderness ENT/Mouth: Ext aud canals clear, TMs without erythema, bulging. No erythema, swelling, or exudate on post pharynx.  Tonsils not swollen or erythematous. Hearing normal.  Neck: Supple, thyroid normal.  Respiratory: Respiratory effort normal, distant lung sounds due to kyphoscoliosis but no wheezing, rales, rhonchi.  Cardio: RRR, bigeminy/extra beats, with no MRGs. Brisk peripheral pulses without edema.  Abdomen: Soft, + BS,  Non tender, no guarding, rebound, hernias, masses. Lymphatics: Non tender without lymphadenopathy.  Musculoskeletal: Severe kyphoscoliosis, 4/5 strength, Normal gait Skin: Warm, dry without rashes, lesions, ecchymosis.  Neuro: Cranial nerves intact. Normal muscle tone, no cerebellar symptoms. Psych: Awake and oriented X 3, normal affect, Insight and Judgment appropriate.  Medicare Attestation I have personally reviewed: The patient's medical and social history Their use of alcohol, tobacco or illicit drugs Their current medications and supplements The patient's functional ability including ADLs,fall risks, home safety risks, cognitive, and hearing and visual impairment Diet and physical activities Evidence for depression or mood disorders  The patient's weight, height, BMI, and visual acuity have been recorded in the chart.  I have made referrals, counseling, and provided education to the patient based on review of the above and I have provided the patient with a written personalized care plan for preventive services.    Izora Ribas, NP   05/12/2018

## 2018-05-12 ENCOUNTER — Ambulatory Visit (INDEPENDENT_AMBULATORY_CARE_PROVIDER_SITE_OTHER): Payer: Medicare Other | Admitting: Adult Health

## 2018-05-12 ENCOUNTER — Encounter: Payer: Self-pay | Admitting: Adult Health

## 2018-05-12 VITALS — BP 128/72 | HR 67 | Temp 97.7°F | Ht 64.0 in | Wt 128.0 lb

## 2018-05-12 DIAGNOSIS — Z79899 Other long term (current) drug therapy: Secondary | ICD-10-CM

## 2018-05-12 DIAGNOSIS — R6889 Other general symptoms and signs: Secondary | ICD-10-CM | POA: Diagnosis not present

## 2018-05-12 DIAGNOSIS — D7589 Other specified diseases of blood and blood-forming organs: Secondary | ICD-10-CM

## 2018-05-12 DIAGNOSIS — M419 Scoliosis, unspecified: Secondary | ICD-10-CM | POA: Diagnosis not present

## 2018-05-12 DIAGNOSIS — Z6822 Body mass index (BMI) 22.0-22.9, adult: Secondary | ICD-10-CM | POA: Diagnosis not present

## 2018-05-12 DIAGNOSIS — E559 Vitamin D deficiency, unspecified: Secondary | ICD-10-CM | POA: Diagnosis not present

## 2018-05-12 DIAGNOSIS — R7303 Prediabetes: Secondary | ICD-10-CM

## 2018-05-12 DIAGNOSIS — Z0001 Encounter for general adult medical examination with abnormal findings: Secondary | ICD-10-CM

## 2018-05-12 DIAGNOSIS — M4120 Other idiopathic scoliosis, site unspecified: Secondary | ICD-10-CM | POA: Diagnosis not present

## 2018-05-12 DIAGNOSIS — Z0000 Encounter for general adult medical examination without abnormal findings: Secondary | ICD-10-CM

## 2018-05-12 DIAGNOSIS — J984 Other disorders of lung: Secondary | ICD-10-CM

## 2018-05-12 DIAGNOSIS — I071 Rheumatic tricuspid insufficiency: Secondary | ICD-10-CM

## 2018-05-12 DIAGNOSIS — I10 Essential (primary) hypertension: Secondary | ICD-10-CM

## 2018-05-12 DIAGNOSIS — E782 Mixed hyperlipidemia: Secondary | ICD-10-CM | POA: Diagnosis not present

## 2018-05-12 NOTE — Patient Instructions (Signed)
  Mr. Greenough , Thank you for taking time to come for your Medicare Wellness Visit. I appreciate your ongoing commitment to your health goals. Please review the following plan we discussed and let me know if I can assist you in the future.   These are the goals we discussed: Goals    . Blood Pressure < 140/90    . Exercise 3x per week (20 min per time)       This is a list of the screening recommended for you and due dates:  Health Maintenance  Topic Date Due  . Flu Shot  04/03/2018  . Tetanus Vaccine  03/05/2027  . Pneumonia vaccines  Completed    [employer] with no obligation # [phone] Do not have to be a member Tues-Sat 10-6  [place]- free test with no obligation # [phone] MUST BE A MEMBER Call for store hours  Have had patient's get good cheaper hearing aids from [employer] The air version has good reviews.

## 2018-05-13 LAB — CBC WITH DIFFERENTIAL/PLATELET
BASOS ABS: 11 {cells}/uL (ref 0–200)
Basophils Relative: 0.2 %
EOS PCT: 0.8 %
Eosinophils Absolute: 42 cells/uL (ref 15–500)
HEMATOCRIT: 41.6 % (ref 38.5–50.0)
Hemoglobin: 13.9 g/dL (ref 13.2–17.1)
LYMPHS ABS: 1855 {cells}/uL (ref 850–3900)
MCH: 32.1 pg (ref 27.0–33.0)
MCHC: 33.4 g/dL (ref 32.0–36.0)
MCV: 96.1 fL (ref 80.0–100.0)
MPV: 11 fL (ref 7.5–12.5)
Monocytes Relative: 10 %
NEUTROS PCT: 54 %
Neutro Abs: 2862 cells/uL (ref 1500–7800)
PLATELETS: 138 10*3/uL — AB (ref 140–400)
RBC: 4.33 10*6/uL (ref 4.20–5.80)
RDW: 12 % (ref 11.0–15.0)
TOTAL LYMPHOCYTE: 35 %
WBC mixed population: 530 cells/uL (ref 200–950)
WBC: 5.3 10*3/uL (ref 3.8–10.8)

## 2018-05-13 LAB — COMPLETE METABOLIC PANEL WITH GFR
AG Ratio: 2 (calc) (ref 1.0–2.5)
ALKALINE PHOSPHATASE (APISO): 53 U/L (ref 40–115)
ALT: 14 U/L (ref 9–46)
AST: 20 U/L (ref 10–35)
Albumin: 4.2 g/dL (ref 3.6–5.1)
BUN: 24 mg/dL (ref 7–25)
CHLORIDE: 100 mmol/L (ref 98–110)
CO2: 40 mmol/L — AB (ref 20–32)
Calcium: 9.7 mg/dL (ref 8.6–10.3)
Creat: 1.07 mg/dL (ref 0.70–1.11)
GFR, Est African American: 72 mL/min/{1.73_m2} (ref >=60)
GFR, Est Non African American: 63 mL/min/{1.73_m2} (ref >=60)
GLOBULIN: 2.1 g/dL (ref 1.9–3.7)
Glucose, Bld: 130 mg/dL — ABNORMAL HIGH (ref 65–99)
POTASSIUM: 5.5 mmol/L — AB (ref 3.5–5.3)
Sodium: 141 mmol/L (ref 135–146)
Total Bilirubin: 0.6 mg/dL (ref 0.2–1.2)
Total Protein: 6.3 g/dL (ref 6.1–8.1)

## 2018-05-13 LAB — LIPID PANEL
CHOLESTEROL: 159 mg/dL (ref <200)
HDL: 51 mg/dL (ref >40)
LDL Cholesterol (Calc): 89 mg/dL (calc)
Non-HDL Cholesterol (Calc): 108 mg/dL (calc) (ref <130)
Total CHOL/HDL Ratio: 3.1 (calc) (ref <5.0)
Triglycerides: 92 mg/dL (ref <150)

## 2018-05-13 LAB — MAGNESIUM: MAGNESIUM: 1.9 mg/dL (ref 1.5–2.5)

## 2018-05-13 LAB — HEMOGLOBIN A1C
EAG (MMOL/L): 6.3 (calc)
Hgb A1c MFr Bld: 5.6 % of total Hgb (ref <5.7)
Mean Plasma Glucose: 114 (calc)

## 2018-05-13 LAB — TSH: TSH: 0.9 mIU/L (ref 0.40–4.50)

## 2018-05-30 ENCOUNTER — Ambulatory Visit (INDEPENDENT_AMBULATORY_CARE_PROVIDER_SITE_OTHER): Payer: Medicare Other | Admitting: *Deleted

## 2018-05-30 DIAGNOSIS — Z23 Encounter for immunization: Secondary | ICD-10-CM | POA: Diagnosis not present

## 2018-06-11 DIAGNOSIS — C44311 Basal cell carcinoma of skin of nose: Secondary | ICD-10-CM | POA: Diagnosis not present

## 2018-06-11 DIAGNOSIS — D1801 Hemangioma of skin and subcutaneous tissue: Secondary | ICD-10-CM | POA: Diagnosis not present

## 2018-06-11 DIAGNOSIS — L821 Other seborrheic keratosis: Secondary | ICD-10-CM | POA: Diagnosis not present

## 2018-06-11 DIAGNOSIS — L570 Actinic keratosis: Secondary | ICD-10-CM | POA: Diagnosis not present

## 2018-06-11 DIAGNOSIS — D225 Melanocytic nevi of trunk: Secondary | ICD-10-CM | POA: Diagnosis not present

## 2018-06-11 DIAGNOSIS — L814 Other melanin hyperpigmentation: Secondary | ICD-10-CM | POA: Diagnosis not present

## 2018-06-11 DIAGNOSIS — Z85828 Personal history of other malignant neoplasm of skin: Secondary | ICD-10-CM | POA: Diagnosis not present

## 2018-07-23 DIAGNOSIS — C44311 Basal cell carcinoma of skin of nose: Secondary | ICD-10-CM | POA: Diagnosis not present

## 2018-07-23 DIAGNOSIS — Z85828 Personal history of other malignant neoplasm of skin: Secondary | ICD-10-CM | POA: Diagnosis not present

## 2018-08-05 ENCOUNTER — Encounter: Payer: Self-pay | Admitting: Internal Medicine

## 2018-08-14 ENCOUNTER — Encounter: Payer: Self-pay | Admitting: Internal Medicine

## 2018-08-14 ENCOUNTER — Other Ambulatory Visit: Payer: Self-pay | Admitting: *Deleted

## 2018-08-14 ENCOUNTER — Ambulatory Visit (INDEPENDENT_AMBULATORY_CARE_PROVIDER_SITE_OTHER): Payer: Medicare Other | Admitting: Internal Medicine

## 2018-08-14 VITALS — BP 124/64 | HR 60 | Temp 97.2°F | Resp 16 | Ht 63.75 in | Wt 130.4 lb

## 2018-08-14 DIAGNOSIS — Z1212 Encounter for screening for malignant neoplasm of rectum: Secondary | ICD-10-CM

## 2018-08-14 DIAGNOSIS — N138 Other obstructive and reflux uropathy: Secondary | ICD-10-CM | POA: Diagnosis not present

## 2018-08-14 DIAGNOSIS — R7303 Prediabetes: Secondary | ICD-10-CM | POA: Diagnosis not present

## 2018-08-14 DIAGNOSIS — Z79899 Other long term (current) drug therapy: Secondary | ICD-10-CM

## 2018-08-14 DIAGNOSIS — Z8249 Family history of ischemic heart disease and other diseases of the circulatory system: Secondary | ICD-10-CM | POA: Diagnosis not present

## 2018-08-14 DIAGNOSIS — Z136 Encounter for screening for cardiovascular disorders: Secondary | ICD-10-CM

## 2018-08-14 DIAGNOSIS — I714 Abdominal aortic aneurysm, without rupture, unspecified: Secondary | ICD-10-CM

## 2018-08-14 DIAGNOSIS — E559 Vitamin D deficiency, unspecified: Secondary | ICD-10-CM | POA: Diagnosis not present

## 2018-08-14 DIAGNOSIS — Z1211 Encounter for screening for malignant neoplasm of colon: Secondary | ICD-10-CM

## 2018-08-14 DIAGNOSIS — J984 Other disorders of lung: Secondary | ICD-10-CM

## 2018-08-14 DIAGNOSIS — I10 Essential (primary) hypertension: Secondary | ICD-10-CM | POA: Diagnosis not present

## 2018-08-14 DIAGNOSIS — J209 Acute bronchitis, unspecified: Secondary | ICD-10-CM

## 2018-08-14 DIAGNOSIS — N401 Enlarged prostate with lower urinary tract symptoms: Secondary | ICD-10-CM

## 2018-08-14 DIAGNOSIS — M419 Scoliosis, unspecified: Secondary | ICD-10-CM

## 2018-08-14 DIAGNOSIS — Z125 Encounter for screening for malignant neoplasm of prostate: Secondary | ICD-10-CM

## 2018-08-14 DIAGNOSIS — R7309 Other abnormal glucose: Secondary | ICD-10-CM | POA: Diagnosis not present

## 2018-08-14 DIAGNOSIS — E782 Mixed hyperlipidemia: Secondary | ICD-10-CM

## 2018-08-14 MED ORDER — ALBUTEROL SULFATE 108 (90 BASE) MCG/ACT IN AEPB: 2.0000 | INHALATION_SPRAY | Freq: Four times a day (QID) | RESPIRATORY_TRACT | 0 refills | Status: DC | PRN

## 2018-08-14 NOTE — Progress Notes (Signed)
[facility]   Timothy Vaughn, M.D.     Uvaldo Bristle. Silverio Lay, P.A.-C Liane Comber, [place]                [address] [place], [place] [address] Telephone [phone] Telefax [phone]  Comprehensive Evaluation & Examination     This very nice 84 y.o. MWM presents for a comprehensive evaluation and management of multiple medical co-morbidities.  Patient has been followed for HTN, HLD, Prediabetes and Vitamin D Deficiency.     Patient also has severe restrictive Lung Disease consequent of severe thoracolumbar kypho-rotoscoliosis. He does endorse DOE after walking short distances and is documented in the past that O2 desats from his resting baseline in the mid 90's to the mid 80's with activity.     HTN predates since 2004. Patient's BP has been controlled at home.  Today's BP is at goal - 124/64. Screening Aortoscan in the past was borderline suspect for AAA.  Patient denies any cardiac symptoms as chest pain, palpitations, shortness of breath, dizziness or ankle swelling.     Patient's hyperlipidemia is controlled with diet and medications. Patient denies myalgias or other medication SE's. Last lipids were at goal: Lab Results  Component Value Date   CHOL 159 05/12/2018   HDL 51 05/12/2018   LDLCALC 89 05/12/2018   TRIG 92 05/12/2018   CHOLHDL 3.1 05/12/2018      Patient has hx/o prediabetes (A1c 5.8% / 2012)  and later T2_DM  (A1c 6.7% / Jan 2017)  And also has CKD2 (GFR 78). A1c have normalized with better eating choices.  He denies reactive hypoglycemic symptoms, visual blurring, diabetic polys or paresthesias. Last A1c was Normal & at goal: Lab Results  Component Value Date   HGBA1C 5.6 05/12/2018       Finally, patient has history of Vitamin D Deficiency  ("35" / 2012) and last vitamin D was at goal: Lab Results  Component Value Date   VD25OH 74 01/30/2018   Current Outpatient  Medications on File Prior to Visit  Medication Sig  . Ascorbic Acid (VITAMIN C PO) Take 1 tablet by mouth daily. Takes 400 mg daily.  Marland Kitchen aspirin 81 MG tablet Take 81 mg by mouth daily.  . Cholecalciferol (VITAMIN D PO) Take 8,000 Int'l Units by mouth daily.   . furosemide (LASIX) 40 MG tablet TAKE 1 TABLET (40 MG TOTAL) BY MOUTH DAILY AS NEEDED FOR FLUID OR EDEMA.  . Multiple Vitamin (MULTI VITAMIN DAILY PO) Take 1 tablet by mouth daily.   . Omega-3 Fatty Acids (FISH OIL PO) Take 1 capsule by mouth daily.   . potassium chloride SA (K-DUR,KLOR-CON) 20 MEQ tablet TAKE 2 TABLETS BY MOUTH DAILY.  . pravastatin (PRAVACHOL) 40 MG tablet Take 1/2 to tablet daily as directed.   No current facility-administered medications on file prior to visit.    Allergies  Allergen Reactions  . Lipitor [Atorvastatin] Other (See Comments)    Severe NIV  . Niaspan [Niacin Er] Itching   Past Medical History:  Diagnosis Date  . Cancer [facility])    prostate  . Hyperlipidemia   . Hypertension   . Pre-diabetes   . Vitamin D deficiency    Health Maintenance  Topic Date Due  . TETANUS/TDAP  03/05/2027  . INFLUENZA VACCINE  Completed  . PNA vac Low Risk Adult  Completed  Immunization History  Administered Date(s) Administered  . DTaP 09/03/2005  . Influenza Whole 05/20/2013  . Influenza, High Dose Seasonal PF 06/04/2014, 06/09/2015, 05/03/2016, 05/28/2017, 05/30/2018  . Pneumococcal Conjugate-13 10/21/2015  . Pneumococcal Polysaccharide-23 02/05/2012  . Td 03/04/2017  . Zoster 09/25/2005   Last Colon -  01/08/2006 -Dr Donne Hazel - No f/u due to age  Past Surgical History:  Procedure Laterality Date  . PROSTATECTOMY  2001   Family History  Problem Relation Age of Onset  . Heart disease Mother   . COPD Mother   . Cancer Father        colon  . Alzheimer's disease Father   . Heart disease Father   . Cancer Brother        stomach  . Stroke Maternal Grandfather   . Diabetes Maternal Grandfather     Social History   Socioeconomic History  . Marital status: Married    Spouse name: Not on file  . Number of children: Not on file  . Years of education: Not on file  . Highest education level: Not on file  Occupational History  . Retired Paediatric nurse  Tobacco Use  . Smoking status: Never Smoker  . Smokeless tobacco: Never Used  Substance and Sexual Activity  . Alcohol use: No  . Drug use: No  . Sexual activity: Not on file    ROS Constitutional: Denies fever, chills, weight loss/gain, headaches, insomnia,  night sweats or change in appetite. Does c/o fatigue. Eyes: Denies redness, blurred vision, diplopia, discharge, itchy or watery eyes.  ENT: Denies discharge, congestion, post nasal drip, epistaxis, sore throat, earache, hearing loss, dental pain, Tinnitus, Vertigo, Sinus pain or snoring.  Cardio: Denies chest pain, palpitations, irregular heartbeat, syncope, dyspnea, diaphoresis, orthopnea, PND, claudication or edema Respiratory: denies cough, dyspnea, DOE, pleurisy, hoarseness, laryngitis or wheezing.  Gastrointestinal: Denies dysphagia, heartburn, reflux, water brash, pain, cramps, nausea, vomiting, bloating, diarrhea, constipation, hematemesis, melena, hematochezia, jaundice or hemorrhoids Genitourinary: Denies dysuria, frequency, discharge, hematuria or flank pain. Has urgency, nocturia x 2-3 & occasional hesitancy. Musculoskeletal: Denies arthralgia, myalgia, stiffness, Jt. Swelling, pain, limp or strain/sprain. Denies Falls. Skin: Denies puritis, rash, hives, warts, acne, eczema or change in skin lesion Neuro: No weakness, tremor, incoordination, spasms, paresthesia or pain Psychiatric: Denies confusion, memory loss or sensory loss. Denies Depression. Endocrine: Denies change in weight, skin, hair change, nocturia, and paresthesia, diabetic polys, visual blurring or hyper / hypo glycemic episodes.  Heme/Lymph: No excessive bleeding, bruising or enlarged  lymph nodes.  Physical Exam  BP 124/64   Pulse 60   Temp (!) 97.2 F (36.2 C)   Resp 16   Ht 5' 3.75" (1.619 m)   Wt 130 lb 6.4 oz (59.1 kg)   BMI 22.56 kg/m   General Appearance: Well nourished and well groomed and in no apparent distress.  Eyes: PERRLA, EOMs, conjunctiva no swelling or erythema, normal fundi and vessels. Sinuses: No frontal/maxillary tenderness ENT/Mouth: EACs patent / TMs  nl. Nares clear without erythema, swelling, mucoid exudates. Oral hygiene is good. No erythema, swelling, or exudate. Tongue normal, non-obstructing. Tonsils not swollen or erythematous. Hearing normal.  Neck: Supple, thyroid not palpable. No bruits, nodes or JVD. Respiratory: Respiratory effort normal.  BS decreased bilateral without rales, rhonci, wheezing or stridor. Cardio: Heart sounds are normal with regular rate and rhythm and no murmurs, rubs or gallops. Peripheral pulses are normal and equal bilaterally without edema. No aortic or femoral bruits. Chest: Kyphosis with severe thoracolumbar rotoscoliosis and  moderate severe chest deformity. Abdomen: Soft, with Nl bowel sounds. Nontender, no guarding, rebound, hernias, masses, or organomegaly.  Lymphatics: Non tender without lymphadenopathy.  Musculoskeletal: Full ROM all peripheral extremities, joint stability, 5/5 strength, and normal gait. Skin: Warm and dry without rashes, lesions, cyanosis, clubbing or  ecchymosis.  Neuro: Cranial nerves intact, reflexes equal bilaterally. Normal muscle tone, no cerebellar symptoms. Sensation intact.  Pysch: Alert and oriented X 3 with normal affect, insight and judgment appropriate.   Assessment and Plan  1. Essential hypertension  - EKG 12-Lead - [place], RETROPERITNL ABD,  LTD - Urinalysis, Routine w reflex microscopic - Microalbumin / creatinine urine ratio - CBC with Differential/Platelet - COMPLETE METABOLIC PANEL WITH GFR - Magnesium - TSH  2. Hyperlipidemia, mixed  - EKG 12-Lead - [place],  RETROPERITNL ABD,  LTD - Lipid panel - TSH  3. Abnormal glucose  - EKG 12-Lead - [place], RETROPERITNL ABD,  LTD - Hemoglobin A1c - Insulin, random  4. Vitamin D deficiency  - VITAMIN D 25 Hydroxyl  5. Prediabetes  - EKG 12-Lead - [place], RETROPERITNL ABD,  LTD - Hemoglobin A1c - Insulin, random  6. Restrictive lung disease due to kyphoscoliosis   7. Screening for colorectal cancer  - POC Hemoccult Bld/Stl  8. BPH with obstruction/lower urinary tract symptoms  - PSA  9. Prostate cancer screening  - PSA  10. Screening for ischemic heart disease  - EKG 12-Lead  11. FHx: heart disease  - EKG 12-Lead - [place], RETROPERITNL ABD,  LTD  12. AAA (abdominal aortic aneurysm) without rupture (HCC)  - [place], RETROPERITNL ABD,  LTD  13. Screening for AAA (aortic abdominal aneurysm)  - [place], RETROPERITNL ABD,  LTD  14. Medication management  - Urinalysis, Routine w reflex microscopic - Microalbumin / creatinine urine ratio - CBC with Differential/Platelet - COMPLETE METABOLIC PANEL WITH GFR - Magnesium - Lipid panel - TSH - Hemoglobin A1c - Insulin, random - VITAMIN D 25 Hydroxyl        Patient was counseled in prudent diet, weight control to achieve/maintain BMI less than 25, BP monitoring, regular exercise and medications as discussed.  Discussed med effects and SE's. Routine screening labs and tests as requested with regular follow-up as recommended. Over 40 minutes of exam, counseling, chart review and high complex critical decision making was performed

## 2018-08-14 NOTE — Patient Instructions (Signed)

## 2018-08-15 LAB — COMPLETE METABOLIC PANEL WITH GFR
AG Ratio: 1.8 (calc) (ref 1.0–2.5)
ALT: 15 U/L (ref 9–46)
AST: 22 U/L (ref 10–35)
Albumin: 4.3 g/dL (ref 3.6–5.1)
Alkaline phosphatase (APISO): 54 U/L (ref 40–115)
BUN/Creatinine Ratio: 33 (calc) — ABNORMAL HIGH (ref 6–22)
BUN: 29 mg/dL — ABNORMAL HIGH (ref 7–25)
CO2: 38 mmol/L — ABNORMAL HIGH (ref 20–32)
Calcium: 9.1 mg/dL (ref 8.6–10.3)
Chloride: 101 mmol/L (ref 98–110)
Creat: 0.87 mg/dL (ref 0.70–1.11)
GFR, Est African American: 91 mL/min/{1.73_m2} (ref >=60)
GFR, Est Non African American: 78 mL/min/{1.73_m2} (ref >=60)
GLOBULIN: 2.4 g/dL (ref 1.9–3.7)
Glucose, Bld: 87 mg/dL (ref 65–99)
POTASSIUM: 4.8 mmol/L (ref 3.5–5.3)
SODIUM: 143 mmol/L (ref 135–146)
Total Bilirubin: 0.6 mg/dL (ref 0.2–1.2)
Total Protein: 6.7 g/dL (ref 6.1–8.1)

## 2018-08-15 LAB — URINALYSIS, ROUTINE W REFLEX MICROSCOPIC
BILIRUBIN URINE: NEGATIVE
Glucose, UA: NEGATIVE
Hgb urine dipstick: NEGATIVE
Ketones, ur: NEGATIVE
Leukocytes, UA: NEGATIVE
Nitrite: NEGATIVE
Protein, ur: NEGATIVE
Specific Gravity, Urine: 1.019 (ref 1.001–1.03)

## 2018-08-15 LAB — VITAMIN D 25 HYDROXY (VIT D DEFICIENCY, FRACTURES): VIT D 25 HYDROXY: 71 ng/mL (ref 30–100)

## 2018-08-15 LAB — LIPID PANEL
CHOLESTEROL: 172 mg/dL (ref <200)
HDL: 62 mg/dL (ref >40)
LDL Cholesterol (Calc): 93 mg/dL (calc)
Non-HDL Cholesterol (Calc): 110 mg/dL (calc) (ref <130)
Total CHOL/HDL Ratio: 2.8 (calc) (ref <5.0)
Triglycerides: 79 mg/dL (ref <150)

## 2018-08-15 LAB — CBC WITH DIFFERENTIAL/PLATELET
BASOS PCT: 0.4 %
Basophils Absolute: 22 cells/uL (ref 0–200)
EOS ABS: 32 {cells}/uL (ref 15–500)
Eosinophils Relative: 0.6 %
HCT: 41.6 % (ref 38.5–50.0)
HEMOGLOBIN: 13.8 g/dL (ref 13.2–17.1)
Lymphs Abs: 1852 cells/uL (ref 850–3900)
MCH: 32.3 pg (ref 27.0–33.0)
MCHC: 33.2 g/dL (ref 32.0–36.0)
MCV: 97.4 fL (ref 80.0–100.0)
MPV: 10.7 fL (ref 7.5–12.5)
Monocytes Relative: 10.6 %
NEUTROS PCT: 54.1 %
Neutro Abs: 2921 cells/uL (ref 1500–7800)
PLATELETS: 143 10*3/uL (ref 140–400)
RBC: 4.27 10*6/uL (ref 4.20–5.80)
RDW: 11.9 % (ref 11.0–15.0)
TOTAL LYMPHOCYTE: 34.3 %
WBC: 5.4 10*3/uL (ref 3.8–10.8)
WBCMIX: 572 {cells}/uL (ref 200–950)

## 2018-08-15 LAB — MICROALBUMIN / CREATININE URINE RATIO
CREATININE, URINE: 64 mg/dL (ref 20–320)
MICROALB UR: 1 mg/dL
Microalb Creat Ratio: 16 mcg/mg creat (ref <30)

## 2018-08-15 LAB — HEMOGLOBIN A1C
Hgb A1c MFr Bld: 5.4 % of total Hgb (ref <5.7)
Mean Plasma Glucose: 108 (calc)
eAG (mmol/L): 6 (calc)

## 2018-08-15 LAB — PSA

## 2018-08-15 LAB — MAGNESIUM: Magnesium: 2 mg/dL (ref 1.5–2.5)

## 2018-08-15 LAB — INSULIN, RANDOM: Insulin: 2.1 u[IU]/mL (ref 2.0–19.6)

## 2018-08-15 LAB — TSH: TSH: 0.63 mIU/L (ref 0.40–4.50)

## 2018-08-17 ENCOUNTER — Encounter: Payer: Self-pay | Admitting: Internal Medicine

## 2018-08-21 NOTE — Progress Notes (Signed)
Assessment and Plan:  Timothy Vaughn was seen today for acute visit.  Diagnoses and all orders for this visit:  Acute laryngopharyngitis Will treat aggressively due to age, co morbidities, and history  Suggested symptomatic OTC remedies. Nasal saline spray for congestion. Nasal steroids, allergy pill, oral steroids offered Follow up as needed. -     azithromycin (ZITHROMAX) 250 MG tablet; Take 2 tablets (500 mg) on  Day 1,  followed by 1 tablet (250 mg) once daily on Days 2 through 5. -     predniSONE (DELTASONE) 20 MG tablet; 2 tablets daily for 3 days, 1 tablet daily for 4 days. -     promethazine-dextromethorphan (PROMETHAZINE-DM) 6.25-15 MG/5ML syrup; Take 5 mLs by mouth 4 (four) times daily as needed for cough.  Further disposition pending results of labs. Discussed med's effects and SE's.   Over 15 minutes of exam, counseling, chart review, and critical decision making was performed.   Future Appointments  Date Time Provider [place]  11/21/2018 10:15 AM Liane Comber, NP [facility] None  02/24/2019 10:30 AM Unk Pinto, MD [facility] None  05/19/2019  2:00 PM Liane Comber, NP [facility] None  09/23/2019  3:00 PM Unk Pinto, MD [facility] None    ------------------------------------------------------------------------------------------------------------------   HPI BP (!) 110/52   Pulse (!) 57   Temp 97.9 F (36.6 C)   Ht 5' 3.75" (1.619 m)   Wt 128 lb (58.1 kg)   SpO2 98%   BMI 22.14 kg/m   84 y.o.male with restrictive lung disease secondary to kyphoscoliosis presents for evaluation of chest congestion, mildly productive cough that began 5 days ago that has been persistent, now developing some hoarseness. He denies unsual dyspnea, chest pains, palpitations. Denies fever/chills. He does endorse a sore throat for the first few days which resolved after consistent gargling of salt water.   He has had the flu vaccine for this season.  Past Medical  History:  Diagnosis Date  . Cancer [facility])    prostate  . Hyperlipidemia   . Hypertension   . Pre-diabetes   . Vitamin D deficiency      Allergies  Allergen Reactions  . Lipitor [Atorvastatin] Other (See Comments)    Severe NIV  . Niaspan [Niacin Er] Itching    Current Outpatient Medications on File Prior to Visit  Medication Sig  . Albuterol Sulfate (PROAIR RESPICLICK) 270 (90 Base) MCG/ACT AEPB Inhale 2 Act into the lungs every 6 (six) hours as needed (for shortness of breath).  . Ascorbic Acid (VITAMIN C PO) Take 1 tablet by mouth daily. Takes 400 mg daily.  Marland Kitchen aspirin 81 MG tablet Take 81 mg by mouth daily.  . Cholecalciferol (VITAMIN D PO) Take 8,000 Int'l Units by mouth daily.   . furosemide (LASIX) 40 MG tablet TAKE 1 TABLET (40 MG TOTAL) BY MOUTH DAILY AS NEEDED FOR FLUID OR EDEMA.  . Multiple Vitamin (MULTI VITAMIN DAILY PO) Take 1 tablet by mouth daily.   . Omega-3 Fatty Acids (FISH OIL PO) Take 1 capsule by mouth daily.   . potassium chloride SA (K-DUR,KLOR-CON) 20 MEQ tablet TAKE 2 TABLETS BY MOUTH DAILY.  . pravastatin (PRAVACHOL) 40 MG tablet Take 1/2 to tablet daily as directed.   No current facility-administered medications on file prior to visit.     ROS: all negative except above.   Physical Exam:  BP (!) 110/52   Pulse (!) 57   Temp 97.9 F (36.6 C)   Ht 5' 3.75" (1.619 m)   Wt 128  lb (58.1 kg)   SpO2 98%   BMI 22.14 kg/m   General Appearance: Well nourished, in no apparent distress. Eyes: PERRLA, EOMs, conjunctiva no swelling or erythema Sinuses: No Frontal/maxillary tenderness ENT/Mouth: Ext aud canals clear, TMs without erythema, bulging. Mildly erythematous post pharynx, without swelling, or exudate on post pharynx.  Tonsils not swollen or erythematous. Hearing normal. MIldly hoarse.  Neck: Supple, thyroid normal.  Respiratory: Respiratory effort normal, BS equal bilaterally without rales, rhonchi, wheezing or stridor.  Cardio: RRR with no MRGs.  Brisk peripheral pulses without edema.  Abdomen: Soft, + BS.  Non tender, no guarding, rebound, hernias, masses. Lymphatics: Non tender without lymphadenopathy.  Musculoskeletal: Severe kyphoscoliosis, normal gait.  Skin: Warm, dry without rashes, lesions, ecchymosis.  Neuro: Cranial nerves intact. Normal muscle tone Psych: Awake and oriented X 3, normal affect, Insight and Judgment appropriate.     Izora Ribas, NP 9:16 AM [facility]

## 2018-08-22 ENCOUNTER — Ambulatory Visit (INDEPENDENT_AMBULATORY_CARE_PROVIDER_SITE_OTHER): Payer: Medicare Other | Admitting: Adult Health

## 2018-08-22 ENCOUNTER — Encounter: Payer: Self-pay | Admitting: Adult Health

## 2018-08-22 VITALS — BP 110/52 | HR 57 | Temp 97.9°F | Ht 63.75 in | Wt 128.0 lb

## 2018-08-22 DIAGNOSIS — J060 Acute laryngopharyngitis: Secondary | ICD-10-CM | POA: Diagnosis not present

## 2018-08-22 MED ORDER — PROMETHAZINE-DM 6.25-15 MG/5ML PO SYRP: 5.0000 mL | ORAL_SOLUTION | Freq: Four times a day (QID) | ORAL | 1 refills | Status: DC | PRN

## 2018-08-22 MED ORDER — AZITHROMYCIN 250 MG PO TABS: ORAL_TABLET | ORAL | 1 refills | Status: AC

## 2018-08-22 MED ORDER — PREDNISONE 20 MG PO TABS: ORAL_TABLET | ORAL | 0 refills | Status: DC

## 2018-08-22 NOTE — Patient Instructions (Signed)

## 2018-09-11 DIAGNOSIS — D225 Melanocytic nevi of trunk: Secondary | ICD-10-CM | POA: Diagnosis not present

## 2018-09-11 DIAGNOSIS — Z85828 Personal history of other malignant neoplasm of skin: Secondary | ICD-10-CM | POA: Diagnosis not present

## 2018-09-11 DIAGNOSIS — C4442 Squamous cell carcinoma of skin of scalp and neck: Secondary | ICD-10-CM | POA: Diagnosis not present

## 2018-09-11 DIAGNOSIS — D0439 Carcinoma in situ of skin of other parts of face: Secondary | ICD-10-CM | POA: Diagnosis not present

## 2018-09-11 DIAGNOSIS — L570 Actinic keratosis: Secondary | ICD-10-CM | POA: Diagnosis not present

## 2018-09-11 DIAGNOSIS — L821 Other seborrheic keratosis: Secondary | ICD-10-CM | POA: Diagnosis not present

## 2018-10-06 DIAGNOSIS — Z85828 Personal history of other malignant neoplasm of skin: Secondary | ICD-10-CM | POA: Diagnosis not present

## 2018-10-06 DIAGNOSIS — C4442 Squamous cell carcinoma of skin of scalp and neck: Secondary | ICD-10-CM | POA: Diagnosis not present

## 2018-10-20 DIAGNOSIS — D0439 Carcinoma in situ of skin of other parts of face: Secondary | ICD-10-CM | POA: Diagnosis not present

## 2018-10-20 DIAGNOSIS — Z85828 Personal history of other malignant neoplasm of skin: Secondary | ICD-10-CM | POA: Diagnosis not present

## 2018-10-21 ENCOUNTER — Other Ambulatory Visit: Payer: Self-pay

## 2018-10-21 DIAGNOSIS — Z1211 Encounter for screening for malignant neoplasm of colon: Secondary | ICD-10-CM

## 2018-10-21 DIAGNOSIS — Z1212 Encounter for screening for malignant neoplasm of rectum: Principal | ICD-10-CM

## 2018-10-21 LAB — POC HEMOCCULT BLD/STL (HOME/3-CARD/SCREEN)
Card #2 Fecal Occult Blod, POC: NEGATIVE
Card #3 Fecal Occult Blood, POC: NEGATIVE
FECAL OCCULT BLD: NEGATIVE

## 2018-10-22 DIAGNOSIS — Z1211 Encounter for screening for malignant neoplasm of colon: Secondary | ICD-10-CM | POA: Diagnosis not present

## 2018-11-21 ENCOUNTER — Ambulatory Visit: Payer: Self-pay | Admitting: Adult Health

## 2018-11-24 NOTE — Progress Notes (Signed)
FOLLOW UP  Assessment and Plan:   Hypertension Well controlled with current medications; taking lasix 5 days weekly and stable for several years Monitor blood pressure at home; patient to call if consistently greater than 130/80 Continue DASH diet.   Reminder to go to the ER if any CP, SOB, nausea, dizziness, severe HA, changes vision/speech, left arm numbness and tingling and jaw pain.  Cholesterol Currently at goal; discussed secondary to age and low risk history trying to cut back on pravastatin - 20 mg daily, patient agreeable to stop and evaluate after discussion Continue low cholesterol diet and exercise.  Check lipid panel.   History of Prediabetes Recent A1Cs at goal Continue diet and exercise.  Perform daily foot/skin check, notify office of any concerning changes.  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 Denies cough, wheezing.  He has tried SABA with perceived benefit; gave anoro samples x 2 weeks, patient to call back to report if this is beneficial to symptoms  Continue diet and meds as discussed. Further disposition pending results of labs. Discussed med's effects and SE's.   Over 30 minutes of exam, counseling, chart review, and critical decision making was performed.   Future Appointments  Date Time Provider [place]  02/24/2019 10:30 AM Unk Pinto, MD [facility] None  05/19/2019  2:00 PM Liane Comber, NP [facility] None  09/23/2019  3:00 PM Unk Pinto, MD  [facility] None    ----------------------------------------------------------------------------------------------------------------------   HPI 85 y.o. male  presents for 3 month follow up on hypertension, cholesterol, history of prediabetes/glucose management and vitamin D deficiency.   Patient also has severe restrictive Lung Disease consequent of severe thoracolumbar kypho-rotoscoliosis. He does endorse DOE after walking short distances and is documented in the past that O2 desats from his resting baseline in the mid 90's to the mid 80's with activity. He does feel his breathing is worse in the past few months, gets short of breath with putting out trash can, etc, but denies cough, wheezing. He has albuterol and was using BID and found this helpful, but has run out and requesting refill. He is not currently on daily inhaler and can't recall if this has been prescribed in the past.    BMI is Body mass index is 23.08 kg/m., he has been working on diet. He reports his exercise is limited recently related to declining condition of his wife for whom he is the primary caregiver. She is stable but continues to require extensive assistance.  Wt Readings from Last 3 Encounters:  11/25/18 133 lb 6.4 oz (60.5 kg)  08/22/18 128 lb (58.1 kg)  08/14/18 130 lb 6.4 oz (59.1 kg)   His blood pressure has been controlled at home, today their BP is BP: 120/72 He takes lasix 40 mg 4-5 days a week, excepting days that he will be leaving the home, has done well with this for several years   He does not workout. He denies chest pain, shortness of breath, dizziness.   He is on cholesterol medication (pravastatin 20 mg daily) and denies myalgias. His  cholesterol is at goal. The cholesterol last visit was:   Lab Results  Component Value Date   CHOL 172 08/14/2018   HDL 62 08/14/2018   LDLCALC 93 08/14/2018   TRIG 79 08/14/2018   CHOLHDL 2.8 08/14/2018    He has been working on diet and exercise for history of  prediabetes, and denies increased appetite, nausea, paresthesia of the feet, polydipsia, polyuria, visual disturbances and vomiting. Last A1C in the office was:  Lab Results  Component Value Date   HGBA1C 5.4 08/14/2018   Patient is on Vitamin D supplement and at goal at recent check:    Lab Results  Component Value Date   VD25OH 71 08/14/2018        Current Medications:  Current Outpatient Medications on File Prior to Visit  Medication Sig  . Albuterol Sulfate (PROAIR RESPICLICK) 562 (90 Base) MCG/ACT AEPB Inhale 2 Act into the lungs every 6 (six) hours as needed (for shortness of breath).  . Ascorbic Acid (VITAMIN C PO) Take 1 tablet by mouth daily. Takes 400 mg daily.  Marland Kitchen aspirin 81 MG tablet Take 81 mg by mouth daily.  . Cholecalciferol (VITAMIN D PO) Take 8,000 Int'l Units by mouth daily.   . furosemide (LASIX) 40 MG tablet TAKE 1 TABLET (40 MG TOTAL) BY MOUTH DAILY AS NEEDED FOR FLUID OR EDEMA.  . Multiple Vitamin (MULTI VITAMIN DAILY PO) Take 1 tablet by mouth daily.   . Omega-3 Fatty Acids (FISH OIL PO) Take 1 capsule by mouth daily.   . potassium chloride SA (K-DUR,KLOR-CON) 20 MEQ tablet TAKE 2 TABLETS BY MOUTH DAILY.  . pravastatin (PRAVACHOL) 40 MG tablet Take 1/2 to tablet daily as directed.  . predniSONE (DELTASONE) 20 MG tablet 2 tablets daily for 3 days, 1 tablet daily for 4 days.  . promethazine-dextromethorphan (PROMETHAZINE-DM) 6.25-15 MG/5ML syrup Take 5 mLs by mouth 4 (four) times daily as needed for cough.   No current facility-administered medications on file prior to visit.      Allergies:  Allergies  Allergen Reactions  . Lipitor [Atorvastatin] Other (See Comments)    Severe NIV  . Niaspan [Niacin Er] Itching     Medical History:  Past Medical History:  Diagnosis Date  . Cancer [facility])    prostate  . Hyperlipidemia   . Hypertension   . Pre-diabetes   . Vitamin D deficiency    Family history- Reviewed and unchanged Social history- Reviewed and  unchanged   Review of Systems:  Review of Systems  Constitutional: Negative for malaise/fatigue and weight loss.  HENT: Negative for congestion, hearing loss, sore throat and tinnitus.   Eyes: Negative for blurred vision and double vision.  Respiratory: Negative for cough, sputum production, shortness of breath (exertional, mildly increased from baseline ) and wheezing.   Cardiovascular: Negative for chest pain, palpitations, orthopnea, claudication and leg swelling.  Gastrointestinal: Negative for abdominal pain, blood in stool, constipation, diarrhea, heartburn, melena, nausea and vomiting.  Genitourinary: Negative.   Musculoskeletal: Negative for joint pain and myalgias.  Skin: Negative for rash.  Neurological: Negative for dizziness, tingling, sensory change, weakness and headaches.  Endo/Heme/Allergies: Negative for polydipsia.  Psychiatric/Behavioral: Negative.   All other systems reviewed and are negative.   Physical Exam: BP 120/72   Pulse 68   Temp 97.7 F (36.5 C)   Ht 5' 3.75" (1.619 m)   Wt 133 lb 6.4 oz (60.5 kg)   SpO2 90%   BMI 23.08 kg/m  Wt Readings from Last 3  Encounters:  11/25/18 133 lb 6.4 oz (60.5 kg)  08/22/18 128 lb (58.1 kg)  08/14/18 130 lb 6.4 oz (59.1 kg)   General Appearance: Well nourished, in no apparent distress. Eyes: left pupil reactive to light, right pupil fixed - per patient this is chronic, vision intact without changes, EOMs, conjunctiva no swelling or erythema Sinuses: No Frontal/maxillary tenderness ENT/Mouth: Ext aud canals clear, TMs without erythema, bulging. No erythema, swelling, or exudate on post pharynx.  Tonsils not swollen or erythematous. Hearing normal.  Neck: Supple, thyroid normal.  Respiratory: Respiratory effort normal, BS present throughout without rales, rhonchi, wheezing or stridor.  Cardio: RRR with no MRGs. Brisk peripheral pulses without edema.  Abdomen: Soft, + BS.  Non tender, no guarding, rebound, masses. He  has R inguinal hernia without tenderness, erythema, easily reduced.  Lymphatics: Non tender without lymphadenopathy.  Musculoskeletal: Severe kyphoscoliosis limiting upper/mid back and chest expansion, symmetrical extremity strength, slow gait Skin: Warm, dry without rashes, lesions, ecchymosis.  Neuro: Cranial nerves intact. No cerebellar symptoms.  Psych: Awake and oriented X 3, normal affect, Insight and Judgment appropriate.    Izora Ribas, NP 10:19 AM [facility]

## 2018-11-25 ENCOUNTER — Ambulatory Visit (INDEPENDENT_AMBULATORY_CARE_PROVIDER_SITE_OTHER): Payer: Medicare Other | Admitting: Adult Health

## 2018-11-25 ENCOUNTER — Other Ambulatory Visit: Payer: Self-pay

## 2018-11-25 ENCOUNTER — Encounter: Payer: Self-pay | Admitting: Adult Health

## 2018-11-25 VITALS — BP 120/72 | HR 68 | Temp 97.7°F | Ht 63.75 in | Wt 133.4 lb

## 2018-11-25 DIAGNOSIS — Z79899 Other long term (current) drug therapy: Secondary | ICD-10-CM | POA: Diagnosis not present

## 2018-11-25 DIAGNOSIS — R7303 Prediabetes: Secondary | ICD-10-CM | POA: Diagnosis not present

## 2018-11-25 DIAGNOSIS — D7589 Other specified diseases of blood and blood-forming organs: Secondary | ICD-10-CM

## 2018-11-25 DIAGNOSIS — E782 Mixed hyperlipidemia: Secondary | ICD-10-CM | POA: Diagnosis not present

## 2018-11-25 DIAGNOSIS — J209 Acute bronchitis, unspecified: Secondary | ICD-10-CM | POA: Diagnosis not present

## 2018-11-25 DIAGNOSIS — Z6822 Body mass index (BMI) 22.0-22.9, adult: Secondary | ICD-10-CM

## 2018-11-25 DIAGNOSIS — E559 Vitamin D deficiency, unspecified: Secondary | ICD-10-CM

## 2018-11-25 DIAGNOSIS — I10 Essential (primary) hypertension: Secondary | ICD-10-CM | POA: Diagnosis not present

## 2018-11-25 MED ORDER — UMECLIDINIUM-VILANTEROL 62.5-25 MCG/INH IN AEPB: 1.0000 | INHALATION_SPRAY | Freq: Every day | RESPIRATORY_TRACT | 2 refills | Status: AC

## 2018-11-25 MED ORDER — ALBUTEROL SULFATE 108 (90 BASE) MCG/ACT IN AEPB: 2.0000 | INHALATION_SPRAY | Freq: Four times a day (QID) | RESPIRATORY_TRACT | 2 refills | Status: AC | PRN

## 2018-11-25 NOTE — Patient Instructions (Signed)
STOP pravastatin once you run out  Take anoro - 1 puff once daily in the morning for breathing   Umeclidinium; Vilanterol inhalation powder What is this medicine? UMECLIDINIUM; VILANTEROL (ue MEK li DIN ee um; vye LAN ter ol) inhalation is a combination of two medicines that decrease inflammation and help to open up the airways of your lungs. It is for chronic obstructive pulmonary disease (COPD), including chronic bronchitis or emphysema. Do NOT use for asthma or an acute asthma attack. Do NOT use for a COPD attack. This medicine may be used for other purposes; ask your health care provider or pharmacist if you have questions. COMMON BRAND NAME(S): ANORO ELLIPTA What should I tell my health care provider before I take this medicine? They need to know if you have any of these conditions: -bladder problems or difficulty passing urine -diabetes -glaucoma -heart disease or irregular heartbeat -high blood pressure -kidney disease -pheochromocytoma -prostate disease -seizures -thyroid disease -an unusual or allergic reaction to umeclidinium, vilanterol, lactose, milk proteins, other medicines, foods, dyes, or preservatives -pregnant or trying to get pregnant -breast-feeding How should I use this medicine? This medicine is inhaled through the mouth. It is used once per day. Follow the directions on the prescription label. Do not use a spacer device with this inhaler. Take your medicine at regular intervals. Do not take your medicine more often than directed. Do not stop taking except on your doctor's advice. Make sure that you are using your inhaler correctly. Ask you doctor or health care provider if you have any questions. Talk to your pediatrician regarding the use of this medicine in children. Special care may be needed. Overdosage: If you think you have taken too much of this medicine contact a poison control center or emergency room at once. NOTE: This medicine is only for you. Do not  share this medicine with others. What if I miss a dose? If you miss a dose, use it as soon as you can. If it is almost time for your next dose, use only that dose and continue with your regular schedule. Do not use double or extra doses. What may interact with this medicine? Do not take this medicine with any of the following medications: -cisapride -dofetilide -dronedarone -MAOIs like Carbex, Eldepryl, Marplan, Nardil, and Parnate -pimozide -thioridazine -ziprasidone This medicine may also interact with the following medications: -antihistamines for allergy -antiviral medicines for HIV or AIDS -atropine -beta-blockers like metoprolol and propranolol -certain medicines for bladder problems like oxybutynin, tolterodine -certain medicines for depression, anxiety, or psychotic disturbances -certain medicines for Parkinson's disease like benztropine, trihexyphenidyl -certain medicines for stomach problems like dicyclomine, hyoscyamine -certain medicines for travel sickness like scopolamine -diuretics -ipratropium -medicines for colds -medicines for fungal infections like ketoconazole and itraconazole -other medicines for breathing problems -other medicines that prolong the QT interval (cause an abnormal heart rhythm) -tiotropium This list may not describe all possible interactions. Give your health care provider a list of all the medicines, herbs, non-prescription drugs, or dietary supplements you use. Also tell them if you smoke, drink alcohol, or use illegal drugs. Some items may interact with your medicine. What should I watch for while using this medicine? Visit your doctor or health care professional for regular checkups. Tell your doctor or health care professional if your symptoms do not get better. If your symptoms get worse or if you need your short-acting inhalers more often, call your doctor right away. Do not use this medicine more than once every 24 hours. What  side effects  may I notice from receiving this medicine? Side effects that you should report to your doctor or health care professional as soon as possible: -allergic reactions like skin rash or hives, swelling of the face, lips, or tongue -breathing problems right after inhaling your medicine -changes in vision -chest pain -eye pain -fast, irregular heartbeat -feeling faint or lightheaded, falls -fever or chills -nausea, vomiting -trouble passing urine or change in the amount of urine Side effects that usually do not require medical attention (report to your doctor or health care professional if they continue or are bothersome): -constipation -cough -diarrhea -headache -muscle cramps -nervousness -sore throat -tremor This list may not describe all possible side effects. Call your doctor for medical advice about side effects. You may report side effects to FDA at 1-800-FDA-1088. Where should I keep my medicine? Keep out of the reach of children. Store at room temperature between 15 and 30 degrees C (59 and 86 degrees F). Store in a dry place away from direct heat or sunlight. Throw away 6 weeks after you remove the inhaler from the foil tray, or after the dose indicator reads 0, whichever comes first. Throw away any unopened packages after the expiration date. NOTE: This sheet is a summary. It may not cover all possible information. If you have questions about this medicine, talk to your doctor, pharmacist, or health care provider.  2019 Elsevier/Gold Standard (2018-02-03 14:47:24)      Inguinal Hernia, Adult An inguinal hernia develops when fat or the intestines push through a weak spot in a muscle where your leg meets your lower abdomen (groin). This creates a bulge. This kind of hernia could also be:  In your scrotum, if you are male.  In folds of skin around your vagina, if you are male. There are three types of inguinal hernias:  Hernias that can be pushed back into the abdomen (are  reducible). This type rarely causes pain.  Hernias that are not reducible (are incarcerated).  Hernias that are not reducible and lose their blood supply (are strangulated). This type of hernia requires emergency surgery. What are the causes? This condition is caused by having a weak spot in the muscles or tissues in the groin. This weak spot develops over time. The hernia may poke through the weak spot when you suddenly strain your lower abdominal muscles, such as when you:  Lift a heavy object.  Strain to have a bowel movement. Constipation can lead to straining.  Cough. What increases the risk? This condition is more likely to develop in:  Men.  Pregnant women.  People who: ? Are overweight. ? Work in jobs that require long periods of standing or heavy lifting. ? Have had an inguinal hernia before. ? Smoke or have lung disease. These factors can lead to long-lasting (chronic) coughing. What are the signs or symptoms? Symptoms may depend on the size of the hernia. Often, a small inguinal hernia has no symptoms. Symptoms of a larger hernia may include:  A lump in the groin area. This is easier to see when standing. It might not be visible when lying down.  Pain or burning in the groin. This may get worse when lifting, straining, or coughing.  A dull ache or a feeling of pressure in the groin.  In men, an unusual lump in the scrotum. Symptoms of a strangulated inguinal hernia may include:  A bulge in your groin that is very painful and tender to the touch.  A bulge that  turns red or purple.  Fever, nausea, and vomiting.  Inability to have a bowel movement or to pass gas. How is this diagnosed? This condition is diagnosed based on your symptoms, your medical history, and a physical exam. Your health care provider may feel your groin area and ask you to cough. How is this treated? Treatment depends on the size of your hernia and whether you have symptoms. If you do not  have symptoms, your health care provider may have you watch your hernia carefully and have you come in for follow-up visits. If your hernia is large or if you have symptoms, you may need surgery to repair the hernia. Follow these instructions at home: Lifestyle  Avoid lifting heavy objects.  Avoid standing for long periods of time.  Do not use any products that contain nicotine or tobacco, such as cigarettes and e-cigarettes. If you need help quitting, ask your health care provider.  Maintain a healthy weight. Preventing constipation  Take actions to prevent constipation. Constipation leads to straining with bowel movements, which can make a hernia worse or cause a hernia repair to break down. Your health care provider may recommend that you: ? Drink enough fluid to keep your urine pale yellow. ? Eat foods that are high in fiber, such as fresh fruits and vegetables, whole grains, and beans. ? Limit foods that are high in fat and processed sugars, such as fried or sweet foods. ? Take an over-the-counter or prescription medicine for constipation. General instructions  You may try to push the hernia back in place by very gently pressing on it while lying down. Do not try to force the bulge back in if it will not push in easily.  Watch your hernia for any changes in shape, size, or color. Get help right away if you notice any changes.  Take over-the-counter and prescription medicines only as told by your health care provider.  Keep all follow-up visits as told by your health care provider. This is important. Contact a health care provider if:  You have a fever.  You develop new symptoms.  Your symptoms get worse. Get help right away if:  You have pain in your groin that suddenly gets worse.  You have a bulge in your groin that: ? Suddenly gets bigger and does not get smaller. ? Becomes red or purple or painful to the touch.  You are a man and you have a sudden pain in your  scrotum, or the size of your scrotum suddenly changes.  You cannot push the hernia back in place by very gently pressing on it when you are lying down. Do not try to force the bulge back in if it will not push in easily.  You have nausea or vomiting that does not go away.  You have a fast heartbeat.  You cannot have a bowel movement or pass gas. These symptoms may represent a serious problem that is an emergency. Do not wait to see if the symptoms will go away. Get medical help right away. Call your local emergency services (911 in the [place] Summary  An inguinal hernia develops when fat or the intestines push through a weak spot in a muscle where your leg meets your lower abdomen (groin).  This condition is caused by having a weak spot in muscles or tissue in your groin.  Symptoms may depend on the size of the hernia, and they may include pain or swelling in your groin. A small inguinal hernia often has  no symptoms.  Treatment may not be needed if you do not have symptoms. If you have symptoms or a large hernia, you may need surgery to repair the hernia.  Avoid lifting heavy objects. Also avoid standing for long amounts of time. This information is not intended to replace advice given to you by your health care provider. Make sure you discuss any questions you have with your health care provider. Document Released: 01/06/2009 Document Revised: 09/21/2017 Document Reviewed: 05/22/2017 Elsevier Interactive Patient Education  2019 [employer].

## 2018-11-26 LAB — LIPID PANEL
Cholesterol: 166 mg/dL (ref <200)
HDL: 61 mg/dL (ref >=40)
LDL CHOLESTEROL (CALC): 86 mg/dL
Non-HDL Cholesterol (Calc): 105 mg/dL (calc) (ref <130)
Total CHOL/HDL Ratio: 2.7 (calc) (ref <5.0)
Triglycerides: 95 mg/dL (ref <150)

## 2018-11-26 LAB — CBC WITH DIFFERENTIAL/PLATELET
Absolute Monocytes: 614 cells/uL (ref 200–950)
Basophils Absolute: 19 cells/uL (ref 0–200)
Basophils Relative: 0.3 %
EOS PCT: 0.6 %
Eosinophils Absolute: 37 cells/uL (ref 15–500)
HCT: 41.6 % (ref 38.5–50.0)
HEMOGLOBIN: 13.9 g/dL (ref 13.2–17.1)
Lymphs Abs: 1848 cells/uL (ref 850–3900)
MCH: 33.1 pg — ABNORMAL HIGH (ref 27.0–33.0)
MCHC: 33.4 g/dL (ref 32.0–36.0)
MCV: 99 fL (ref 80.0–100.0)
MPV: 10.7 fL (ref 7.5–12.5)
Monocytes Relative: 9.9 %
Neutro Abs: 3683 cells/uL (ref 1500–7800)
Neutrophils Relative %: 59.4 %
Platelets: 129 10*3/uL — ABNORMAL LOW (ref 140–400)
RBC: 4.2 10*6/uL (ref 4.20–5.80)
RDW: 11.8 % (ref 11.0–15.0)
Total Lymphocyte: 29.8 %
WBC: 6.2 10*3/uL (ref 3.8–10.8)

## 2018-11-26 LAB — TSH: TSH: 0.91 mIU/L (ref 0.40–4.50)

## 2018-11-26 LAB — COMPLETE METABOLIC PANEL WITH GFR
AG Ratio: 2.2 (calc) (ref 1.0–2.5)
ALT: 14 U/L (ref 9–46)
AST: 22 U/L (ref 10–35)
Albumin: 4.4 g/dL (ref 3.6–5.1)
Alkaline phosphatase (APISO): 52 U/L (ref 35–144)
BUN: 23 mg/dL (ref 7–25)
CO2: 38 mmol/L — ABNORMAL HIGH (ref 20–32)
CREATININE: 0.85 mg/dL (ref 0.70–1.11)
Calcium: 9.6 mg/dL (ref 8.6–10.3)
Chloride: 99 mmol/L (ref 98–110)
GFR, Est African American: 91 mL/min/{1.73_m2} (ref >=60)
GFR, Est Non African American: 78 mL/min/{1.73_m2} (ref >=60)
Globulin: 2 g/dL (calc) (ref 1.9–3.7)
Glucose, Bld: 127 mg/dL — ABNORMAL HIGH (ref 65–99)
Potassium: 4.3 mmol/L (ref 3.5–5.3)
Sodium: 143 mmol/L (ref 135–146)
Total Bilirubin: 0.8 mg/dL (ref 0.2–1.2)
Total Protein: 6.4 g/dL (ref 6.1–8.1)

## 2018-11-26 LAB — MAGNESIUM: Magnesium: 1.9 mg/dL (ref 1.5–2.5)

## 2018-12-09 DIAGNOSIS — D044 Carcinoma in situ of skin of scalp and neck: Secondary | ICD-10-CM | POA: Diagnosis not present

## 2018-12-09 DIAGNOSIS — L905 Scar conditions and fibrosis of skin: Secondary | ICD-10-CM | POA: Diagnosis not present

## 2018-12-09 DIAGNOSIS — L0101 Non-bullous impetigo: Secondary | ICD-10-CM | POA: Diagnosis not present

## 2018-12-09 DIAGNOSIS — L011 Impetiginization of other dermatoses: Secondary | ICD-10-CM | POA: Diagnosis not present

## 2018-12-09 DIAGNOSIS — L308 Other specified dermatitis: Secondary | ICD-10-CM | POA: Diagnosis not present

## 2018-12-09 DIAGNOSIS — D225 Melanocytic nevi of trunk: Secondary | ICD-10-CM | POA: Diagnosis not present

## 2018-12-09 DIAGNOSIS — Z85828 Personal history of other malignant neoplasm of skin: Secondary | ICD-10-CM | POA: Diagnosis not present

## 2018-12-09 DIAGNOSIS — L570 Actinic keratosis: Secondary | ICD-10-CM | POA: Diagnosis not present

## 2018-12-09 DIAGNOSIS — D1801 Hemangioma of skin and subcutaneous tissue: Secondary | ICD-10-CM | POA: Diagnosis not present

## 2018-12-09 DIAGNOSIS — L821 Other seborrheic keratosis: Secondary | ICD-10-CM | POA: Diagnosis not present

## 2019-02-24 ENCOUNTER — Ambulatory Visit: Payer: Self-pay | Admitting: Internal Medicine

## 2019-02-27 ENCOUNTER — Ambulatory Visit: Payer: Self-pay | Admitting: Internal Medicine

## 2019-03-01 NOTE — Progress Notes (Signed)
History of Present Illness:       This very nice 85 y.o. MWM presents for 6 month follow up with HTN, HLD, Pre-Diabetes and Vitamin D Deficiency. 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      Patient is treated for HTN (2004) & BP has been controlled at home. Today's BP is at goal - 132/80. Patient has had no complaints of any cardiac type chest pain, palpitations, dyspnea / orthopnea / PND, dizziness, claudication, or dependent edema. Patient does report exertional hypoxia with mild to moderate activities.       Hyperlipidemia is controlled with diet & meds. Patient denies myalgias or other med SE's. Last Lipids were at goal: Lab Results  Component Value Date   CHOL 166 11/25/2018   HDL 61 11/25/2018   LDLCALC 86 11/25/2018   TRIG 95 11/25/2018   CHOLHDL 2.7 11/25/2018       Also, the patient has history of PreDiabetes (A1c 5.8% / 2012)  and then T2_NIDDM  (A1c 6.7% / Jan 2017). He also has CKD2 (GFR 78). His A1c's have normalized with better diet & weight control.   and has had no symptoms of reactive hypoglycemia, diabetic polys, paresthesias or visual blurring.  Last A1c was Normal & at goal: Lab Results  Component Value Date   HGBA1C 5.4 08/14/2018       Further, the patient also has history of Vitamin D Deficiency ("35" / 2012)  and supplements vitamin D without any suspected side-effects. Last vitamin D was at goal: Lab Results  Component Value Date   VD25OH 71 08/14/2018   Current Outpatient Medications on File Prior to Visit  Medication Sig  . Albuterol Sulfate (PROAIR RESPICLICK) 979 (90 Base) MCG/ACT AEPB Inhale 2 Act into the lungs every 6 (six) hours as needed (for shortness of breath).  . Ascorbic Acid (VITAMIN C PO) Take 1 tablet by mouth daily. Takes 400 mg daily.  Marland Kitchen aspirin 81 MG tablet Take 81 mg by mouth daily.  . Cholecalciferol (VITAMIN D PO) Take 8,000 Int'l Units by mouth daily.   . furosemide (LASIX) 40 MG tablet  TAKE 1 TABLET (40 MG TOTAL) BY MOUTH DAILY AS NEEDED FOR FLUID OR EDEMA.  . Multiple Vitamin (MULTI VITAMIN DAILY PO) Take 1 tablet by mouth daily.   . Omega-3 Fatty Acids (FISH OIL PO) Take 1 capsule by mouth daily.   . potassium chloride SA (K-DUR,KLOR-CON) 20 MEQ tablet TAKE 2 TABLETS BY MOUTH DAILY.  Marland Kitchen umeclidinium-vilanterol (ANORO ELLIPTA) 62.5-25 MCG/INH AEPB Inhale 1 puff into the lungs daily. Make sure you rinse your mouth after each use.   No current facility-administered medications on file prior to visit.    Allergies  Allergen Reactions  . Lipitor [Atorvastatin] Other (See Comments)    Severe NIV  . Niaspan [Niacin Er] Itching   PMHx:   Past Medical History:  Diagnosis Date  . Cancer [facility])    prostate  . Hyperlipidemia   . Hypertension   . Pre-diabetes   . Vitamin D deficiency    Immunization History  Administered Date(s) Administered  . DTaP 09/03/2005  . Influenza Whole 05/20/2013  . Influenza, High Dose Seasonal PF 06/04/2014, 06/09/2015, 05/03/2016, 05/28/2017, 05/30/2018  . Pneumococcal Conjugate-13 10/21/2015  . Pneumococcal Polysaccharide-23 02/05/2012  . Td 03/04/2017  . Zoster 09/25/2005   Past Surgical History:  Procedure Laterality Date  . PROSTATECTOMY  2001   FHx:    Reviewed / unchanged  SHx:  Reviewed / unchanged   Systems Review:  Constitutional: Denies fever, chills, wt changes, headaches, insomnia, fatigue, night sweats, change in appetite. Eyes: Denies redness, blurred vision, diplopia, discharge, itchy, watery eyes.  ENT: Denies discharge, congestion, post nasal drip, epistaxis, sore throat, earache, hearing loss, dental pain, tinnitus, vertigo, sinus pain, snoring.  CV: Denies chest pain, palpitations, irregular heartbeat, syncope, dyspnea, diaphoresis, orthopnea, PND, claudication or edema. Respiratory: denies cough, dyspnea, DOE, pleurisy, hoarseness, laryngitis, wheezing.  Gastrointestinal: Denies dysphagia, odynophagia,  heartburn, reflux, water brash, abdominal pain or cramps, nausea, vomiting, bloating, diarrhea, constipation, hematemesis, melena, hematochezia  or hemorrhoids. Genitourinary: Denies dysuria, frequency, urgency, nocturia, hesitancy, discharge, hematuria or flank pain. Musculoskeletal: Denies arthralgias, myalgias, stiffness, jt. swelling, pain, limping or strain/sprain.  Skin: Denies pruritus, rash, hives, warts, acne, eczema or change in skin lesion(s). Neuro: No weakness, tremor, incoordination, spasms, paresthesia or pain. Psychiatric: Denies confusion, memory loss or sensory loss. Endo: Denies change in weight, skin or hair change.  Heme/Lymph: No excessive bleeding, bruising or enlarged lymph nodes.  Physical Exam  BP 132/80   Pulse 60   Temp 97.6 F (36.4 C)   Resp 16   Ht 5' 3.75" (1.619 m)   Wt 135 lb 12.8 oz (61.6 kg)   BMI 23.49 kg/m   Appears  well nourished, well groomed  and in no distress.  Eyes: PERRLA, EOMs, conjunctiva no swelling or erythema. Sinuses: No frontal/maxillary tenderness ENT/Mouth: EAC's clear, TM's nl w/o erythema, bulging. Nares clear w/o erythema, swelling, exudates. Oropharynx clear without erythema or exudates. Oral hygiene is good. Tongue normal, non obstructing. Hearing intact.  Neck: Supple. Thyroid not palpable. Car 2+/2+ without bruits, nodes or JVD. Chest: Severe thoracolumbar kyphohrotoscoliosis and moderate severe chest deformity. Respirations nl with BS clear decreased w/o rales, rhonchi, wheezing or stridor.  Cor: Heart sounds normal w/ regular rate and rhythm without sig. murmurs, gallops, clicks or rubs. Peripheral pulses normal and equal  without edema.  Abdomen: Soft & bowel sounds normal. Non-tender w/o guarding, rebound, hernias, masses or organomegaly.  Lymphatics: Unremarkable.  Musculoskeletal: Full ROM all peripheral extremities, joint stability, 5/5 strength and normal gait.  Skin: Warm, dry without exposed rashes, lesions or  ecchymosis apparent.  Neuro: Cranial nerves intact, reflexes equal bilaterally. Sensory-motor testing grossly intact. Tendon reflexes grossly intact.  Pysch: Alert & oriented x 3.  Insight and judgement nl & appropriate. No ideations.  Assessment and Plan:  1. Essential hypertension  - Continue medication, monitor blood pressure at home.  - Continue DASH diet.  Reminder to go to the ER if any CP,  SOB, nausea, dizziness, severe HA, changes vision/speech.  - CBC with Differential/Platelet - COMPLETE METABOLIC PANEL WITH GFR - Magnesium - TSH  2. Hyperlipidemia, mixed  - Continue diet/meds, exercise,& lifestyle modifications.  - Continue monitor periodic cholesterol/liver & renal functions   - TSH  3. Abnormal glucose  - Insulin, random - Fructosamine  4. Vitamin D deficiency  - Continue diet, exercise  - Lifestyle modifications.  - Monitor appropriate labs. - Continue supplementation.  - VITAMIN D 25 Hydroxyl  5. Restrictive lung disease due to kyphoscoliosis  6. History of prediabetes  - Insulin, random - Fructosamine  7. Medication management  - CBC with Differential/Platelet - COMPLETE METABOLIC PANEL WITH GFR - Magnesium - TSH - Insulin, random - VITAMIN D 25 Hydroxyl - Fructosamine       Discussed  regular exercise, BP monitoring, weight control to achieve/maintain BMI less than 25 and discussed med and SE's. Recommended  labs to assess and monitor clinical status with further disposition pending results of labs.  I discussed the assessment and treatment plan with the patient. The patient was provided an opportunity to ask questions and all were answered. The patient agreed with the plan and demonstrated an understanding of the instructions. I provided over 30 minutes of exam, counseling, chart review and  complex critical decision making.   Kirtland Bouchard, MD

## 2019-03-02 ENCOUNTER — Encounter: Payer: Self-pay | Admitting: Internal Medicine

## 2019-03-02 ENCOUNTER — Ambulatory Visit (INDEPENDENT_AMBULATORY_CARE_PROVIDER_SITE_OTHER): Payer: Medicare Other | Admitting: Internal Medicine

## 2019-03-02 ENCOUNTER — Other Ambulatory Visit: Payer: Self-pay

## 2019-03-02 VITALS — BP 132/80 | HR 60 | Temp 97.6°F | Resp 16 | Ht 63.75 in | Wt 135.8 lb

## 2019-03-02 DIAGNOSIS — R7309 Other abnormal glucose: Secondary | ICD-10-CM | POA: Diagnosis not present

## 2019-03-02 DIAGNOSIS — I10 Essential (primary) hypertension: Secondary | ICD-10-CM | POA: Diagnosis not present

## 2019-03-02 DIAGNOSIS — Z79899 Other long term (current) drug therapy: Secondary | ICD-10-CM

## 2019-03-02 DIAGNOSIS — E782 Mixed hyperlipidemia: Secondary | ICD-10-CM | POA: Diagnosis not present

## 2019-03-02 DIAGNOSIS — M419 Scoliosis, unspecified: Secondary | ICD-10-CM | POA: Diagnosis not present

## 2019-03-02 DIAGNOSIS — E559 Vitamin D deficiency, unspecified: Secondary | ICD-10-CM | POA: Diagnosis not present

## 2019-03-02 DIAGNOSIS — Z87898 Personal history of other specified conditions: Secondary | ICD-10-CM | POA: Diagnosis not present

## 2019-03-02 DIAGNOSIS — J984 Other disorders of lung: Secondary | ICD-10-CM

## 2019-03-02 NOTE — Patient Instructions (Signed)

## 2019-03-05 LAB — CBC WITH DIFFERENTIAL/PLATELET
Absolute Monocytes: 499 cells/uL (ref 200–950)
Basophils Absolute: 17 cells/uL (ref 0–200)
Basophils Relative: 0.3 %
Eosinophils Absolute: 29 cells/uL (ref 15–500)
Eosinophils Relative: 0.5 %
HCT: 42.1 % (ref 38.5–50.0)
Hemoglobin: 14.1 g/dL (ref 13.2–17.1)
Lymphs Abs: 1798 cells/uL (ref 850–3900)
MCH: 32.4 pg (ref 27.0–33.0)
MCHC: 33.5 g/dL (ref 32.0–36.0)
MCV: 96.8 fL (ref 80.0–100.0)
MPV: 10.7 fL (ref 7.5–12.5)
Monocytes Relative: 8.6 %
Neutro Abs: 3457 cells/uL (ref 1500–7800)
Neutrophils Relative %: 59.6 %
Platelets: 139 10*3/uL — ABNORMAL LOW (ref 140–400)
RBC: 4.35 10*6/uL (ref 4.20–5.80)
RDW: 12 % (ref 11.0–15.0)
Total Lymphocyte: 31 %
WBC: 5.8 10*3/uL (ref 3.8–10.8)

## 2019-03-05 LAB — TSH: TSH: 0.91 mIU/L (ref 0.40–4.50)

## 2019-03-05 LAB — COMPLETE METABOLIC PANEL WITH GFR
AG Ratio: 2 (calc) (ref 1.0–2.5)
ALT: 16 U/L (ref 9–46)
AST: 22 U/L (ref 10–35)
Albumin: 4.3 g/dL (ref 3.6–5.1)
Alkaline phosphatase (APISO): 54 U/L (ref 35–144)
BUN: 20 mg/dL (ref 7–25)
CO2: 36 mmol/L — ABNORMAL HIGH (ref 20–32)
Calcium: 9.5 mg/dL (ref 8.6–10.3)
Chloride: 101 mmol/L (ref 98–110)
Creat: 0.78 mg/dL (ref 0.70–1.11)
GFR, Est African American: 94 mL/min/{1.73_m2} (ref >=60)
GFR, Est Non African American: 81 mL/min/{1.73_m2} (ref >=60)
Globulin: 2.2 g/dL (calc) (ref 1.9–3.7)
Glucose, Bld: 84 mg/dL (ref 65–99)
Potassium: 4.2 mmol/L (ref 3.5–5.3)
Sodium: 145 mmol/L (ref 135–146)
Total Bilirubin: 0.9 mg/dL (ref 0.2–1.2)
Total Protein: 6.5 g/dL (ref 6.1–8.1)

## 2019-03-05 LAB — FRUCTOSAMINE: Fructosamine: 266 umol/L (ref 205–285)

## 2019-03-05 LAB — MAGNESIUM: Magnesium: 1.7 mg/dL (ref 1.5–2.5)

## 2019-03-05 LAB — INSULIN, RANDOM: Insulin: 4.7 u[IU]/mL

## 2019-03-05 LAB — VITAMIN D 25 HYDROXY (VIT D DEFICIENCY, FRACTURES): Vit D, 25-Hydroxy: 71 ng/mL (ref 30–100)

## 2019-03-26 DIAGNOSIS — D1801 Hemangioma of skin and subcutaneous tissue: Secondary | ICD-10-CM | POA: Diagnosis not present

## 2019-03-26 DIAGNOSIS — L570 Actinic keratosis: Secondary | ICD-10-CM | POA: Diagnosis not present

## 2019-03-26 DIAGNOSIS — L821 Other seborrheic keratosis: Secondary | ICD-10-CM | POA: Diagnosis not present

## 2019-03-26 DIAGNOSIS — Z85828 Personal history of other malignant neoplasm of skin: Secondary | ICD-10-CM | POA: Diagnosis not present

## 2019-03-26 DIAGNOSIS — C4442 Squamous cell carcinoma of skin of scalp and neck: Secondary | ICD-10-CM | POA: Diagnosis not present

## 2019-05-19 ENCOUNTER — Ambulatory Visit: Payer: Self-pay | Admitting: Adult Health

## 2019-06-01 ENCOUNTER — Ambulatory Visit: Payer: Self-pay | Admitting: Adult Health

## 2019-06-01 NOTE — Progress Notes (Signed)
Patient ID: Timothy Vaughn, male   DOB: [birth year], 85 y.o.   MRN: [id number]  MEDICARE ANNUAL WELLNESS VISIT AND 3 MONTH OV  Assessment:   Medicare annual wellness visit  Essential hypertension - continue medications, DASH diet, exercise and monitor at home. Call if greater than 130/80.  -     CBC with Differential/Platelet -     CMP/GFR -     TSH  Moderate tricuspid regurgitation Monitor  Restrictive lung disease due to kyphoscoliosis Monitor for infection, continue deep breaths, follows pulmonary Continue inhalers  Other abnormal glucose Recent A1Cs at goal Discussed diet/exercise, weight management  Defer A1C; check CMP  Kyphoscoliosis monitor  Hyperlipidemia -check lipids, decrease fatty foods, increase activity.  -     Lipid panel  Vitamin D deficiency At goal at recent check; continue to recommend supplementation for goal of 70-100 Defer vitamin D level  Medication management -     Magnesium  Pretibial edema bilateral - elevate legs TID, increase activity, increase water, decrease sodium intake.  Wear compression socks more routinely if available. Return to the office if no change with symptoms. Check labs.  R hand lesion Appears as cutaneous horn or keratotic plug; Dr. Melford Aase in agreement; monitor; can schedule removal if progressive/persistent or bothersome or concerning changes  Future Appointments  Date Time Provider [place]  09/23/2019  3:00 PM Unk Pinto, MD [id number] None     Plan:   During the course of the visit the patient was educated and counseled about appropriate screening and preventive services including:    Pneumococcal vaccine   Influenza vaccine  Td vaccine  Screening electrocardiogram  Bone densitometry screening  Colorectal cancer screening  Diabetes screening  Glaucoma screening  Nutrition counseling   Advanced directives: requested   Subjective:  Timothy Vaughn is a 85 y.o. male who  presents for Medicare Annual Wellness Visit and 6 month OV follow up.    He has severe kyphoscoliosis with consequent restrictive lung disease, followed by pulmonology, in on anoro with perceived benefit.  has had normal echo with moderate TR.   He reports his wife's health is declining and cannot be left alone, used to golf, etc but hasn't been able to  BMI is Body mass index is 23.18 kg/m., he has not been working on diet and exercise. Wt Readings from Last 3 Encounters:  06/03/19 134 lb (60.8 kg)  03/02/19 135 lb 12.8 oz (61.6 kg)  11/25/18 133 lb 6.4 oz (60.5 kg)   He has had elevated blood pressure since 2004. His blood pressure has been controlled at home, today their BP is BP: 124/76 He does workout. He denies chest pain, dizziness.   He is on cholesterol medication ( formerly on pravastatin 40 mg daily, recently d/c'd r/t age, continues with omega 3 supplement). His cholesterol is at goal. The cholesterol last visit was:   Lab Results  Component Value Date   CHOL 166 11/25/2018   HDL 61 11/25/2018   LDLCALC 86 11/25/2018   TRIG 95 11/25/2018   CHOLHDL 2.7 11/25/2018   He has had history of prediabetes with an A1C in the DM range one time Jan 2017  He has been working on diet for preDM, he is on bASA, he is not on ACE/ARB due to hypotension and denies foot ulcerations, hyperglycemia, hypoglycemia , increased appetite, nausea, paresthesia of the feet, polydipsia, polyuria and visual disturbances. Last A1C in the office was:  Lab Results  Component Value Date   HGBA1C  5.4 08/14/2018   Lab Results  Component Value Date   GFRNONAA 81 03/02/2019   Patient is on Vitamin D supplement.   Lab Results  Component Value Date   VD25OH 71 03/02/2019         Medication Review: Current Outpatient Medications on File Prior to Visit  Medication Sig  . Albuterol Sulfate (PROAIR RESPICLICK) 123XX123 (90 Base) MCG/ACT AEPB Inhale 2 Act into the lungs every 6 (six) hours as needed (for  shortness of breath).  . Ascorbic Acid (VITAMIN C PO) Take 1 tablet by mouth daily. Takes 400 mg daily.  Marland Kitchen aspirin 81 MG tablet Take 81 mg by mouth daily.  . Cholecalciferol (VITAMIN D PO) Take 8,000 Int'l Units by mouth daily.   . furosemide (LASIX) 40 MG tablet TAKE 1 TABLET (40 MG TOTAL) BY MOUTH DAILY AS NEEDED FOR FLUID OR EDEMA.  . Multiple Vitamin (MULTI VITAMIN DAILY PO) Take 1 tablet by mouth daily.   . Omega-3 Fatty Acids (FISH OIL PO) Take 1 capsule by mouth daily.   . potassium chloride SA (K-DUR,KLOR-CON) 20 MEQ tablet TAKE 2 TABLETS BY MOUTH DAILY.  Marland Kitchen umeclidinium-vilanterol (ANORO ELLIPTA) 62.5-25 MCG/INH AEPB Inhale 1 puff into the lungs daily. Make sure you rinse your mouth after each use.   No current facility-administered medications on file prior to visit.     Current Problems (verified) Patient Active Problem List   Diagnosis Date Noted  . Moderate tricuspid regurgitation 09/08/2015  . Encounter for Medicare annual wellness exam 04/12/2015  . Medication management 02/10/2014  . Essential hypertension 08/14/2013  . Hyperlipidemia 08/14/2013  . Other abnormal glucose 08/14/2013  . Vitamin D deficiency 08/14/2013  . Kyphoscoliosis 08/14/2013  . Restrictive lung disease due to kyphoscoliosis 08/14/2013   Screening Tests Immunization History  Administered Date(s) Administered  . DTaP 09/03/2005  . Influenza Whole 05/20/2013  . Influenza, High Dose Seasonal PF 06/04/2014, 06/09/2015, 05/03/2016, 05/28/2017, 05/30/2018  . Pneumococcal Conjugate-13 10/21/2015  . Pneumococcal Polysaccharide-23 02/05/2012  . Td 03/04/2017  . Zoster 09/25/2005   Preventative care: Last colonoscopy: 2007 declines due to age Echo 2017 PFTs 09/2015 CT AB 08/2010  Prior vaccinations: Tdap: 2007, 2018  Influenza: 05/2018 DUE Pneumococcal: 02/2012 prevnar 13: 2017 Shingles/Zostavax: 09/2005  Names of Other Physician/Practitioners you currently use: 1. [facility] here for primary care 2. Dr. Augusto Gamble, eye doctor, last visit 06/2018 - reports cataract, has glasses, has follow up scheduled 3. Dr. Karma Lew, dentist, last visit 2020  Patient Care Team: Unk Pinto, MD as PCP - General (Internal Medicine) Inda Castle, MD (Inactive) as Consulting Physician (Gastroenterology) Carolan Clines, MD (Inactive) as Consulting Physician (Urology)   History reviewed: allergies, current medications, past family history, past medical history, past social history, past surgical history and problem list  Allergies Allergies  Allergen Reactions  . Lipitor [Atorvastatin] Other (See Comments)    Severe NIV  . Niaspan [Niacin Er] Itching    SURGICAL HISTORY He  has a past surgical history that includes Prostatectomy (2001). FAMILY HISTORY His family history includes Alzheimer's disease in his father; COPD in his mother; Cancer in his brother and father; Diabetes in his maternal grandfather; Heart disease in his father and mother; Stroke in his maternal grandfather. SOCIAL HISTORY He  reports that he has never smoked. He has never used smokeless tobacco. He reports that he does not drink alcohol or use drugs.  MEDICARE WELLNESS OBJECTIVES: Physical activity: Current Exercise Habits: The patient does not participate in  regular exercise at present(active around house and garden), Exercise limited by: Other - see comments(primary caregiver for wife needs 24/7 supervision) Cardiac risk factors: Cardiac Risk Factors include: advanced age (>4men, >27 women);dyslipidemia;male gender;hypertension;sedentary lifestyle Depression/mood screen:   Depression screen [facility] 2/9 06/03/2019  Decreased Interest 0  Down, Depressed, Hopeless 0  PHQ - 2 Score 0    ADLs:  In your present state of health, do you have any difficulty performing the following activities: 06/03/2019 03/02/2019  Hearing? Y N  Comment has bilateral hearing aids, struggling  with mask, overdue follow up -  Vision? Y N  Comment due for cataract surgery, has follow up ophth -  Difficulty concentrating or making decisions? N N  Walking or climbing stairs? N N  Dressing or bathing? N N  Doing errands, shopping? N N  Some recent data might be hidden     Cognitive Testing  Alert? Yes  Normal Appearance?Yes  Oriented to person? Yes  Place? Yes   Time? Yes  Recall of three objects?  Yes  Can perform simple calculations? Yes  Displays appropriate judgment?Yes  Can read the correct time from a watch face?Yes  EOL planning: Does Patient Have a Medical Advance Directive?: No Would patient like information on creating a medical advance directive?: No - Patient declined(has papers, needs to complete)   Objective:     Blood pressure 124/76, pulse (!) 56, temperature 97.7 F (36.5 C), height 5' 3.75" (1.619 m), weight 134 lb (60.8 kg), SpO2 99 %. Body mass index is 23.18 kg/m.  General appearance: alert, no distress, WD/WN, male Eyes: PERRLA, EOMs, conjunctiva no swelling or erythema Sinuses: No Frontal/maxillary tenderness ENT/Mouth: Ext aud canals clear, TMs without erythema, bulging. No erythema, swelling, or exudate on post pharynx.  Tonsils not swollen or erythematous. Hearing normal.  Neck: Supple, thyroid normal.  Respiratory: Respiratory effort normal, distant lung sounds due to kyphoscoliosis but no wheezing, rales, rhonchi. Cardio: RRR, bigeminy/extra beats, with no MRGs. Intact peripheral pulses, 1+ pitting pretibial edema bilaterally .  Abdomen: Soft, + BS,  Non tender, no guarding, rebound, hernias, masses. Lymphatics: Non tender without lymphadenopathy.  Musculoskeletal: Severe kyphoscoliosis, 4/5 strength, Normal gait Skin: Warm, dry without rashes, he has scattered ecchymosis bil upper extremities; he has R hand dorsal lesion, erythematous base  1 cm with hyperkeratotic lesion approx 3 mm Neuro: Cranial nerves intact. Normal muscle tone, no  cerebellar symptoms. Psych: Awake and oriented X 3, normal affect, Insight and Judgment appropriate.  Medicare Attestation I have personally reviewed: The patient's medical and social history Their use of alcohol, tobacco or illicit drugs Their current medications and supplements The patient's functional ability including ADLs,fall risks, home safety risks, cognitive, and hearing and visual impairment Diet and physical activities Evidence for depression or mood disorders  The patient's weight, height, BMI, and visual acuity have been recorded in the chart.  I have made referrals, counseling, and provided education to the patient based on review of the above and I have provided the patient with a written personalized care plan for preventive services.    Izora Ribas, NP   06/03/2019

## 2019-06-02 ENCOUNTER — Ambulatory Visit: Payer: Self-pay | Admitting: Adult Health

## 2019-06-03 ENCOUNTER — Encounter: Payer: Self-pay | Admitting: Adult Health

## 2019-06-03 ENCOUNTER — Ambulatory Visit (INDEPENDENT_AMBULATORY_CARE_PROVIDER_SITE_OTHER): Payer: Medicare Other | Admitting: Adult Health

## 2019-06-03 ENCOUNTER — Other Ambulatory Visit: Payer: Self-pay

## 2019-06-03 VITALS — BP 124/76 | HR 56 | Temp 97.7°F | Ht 63.75 in | Wt 134.0 lb

## 2019-06-03 DIAGNOSIS — J984 Other disorders of lung: Secondary | ICD-10-CM | POA: Diagnosis not present

## 2019-06-03 DIAGNOSIS — E782 Mixed hyperlipidemia: Secondary | ICD-10-CM | POA: Diagnosis not present

## 2019-06-03 DIAGNOSIS — E559 Vitamin D deficiency, unspecified: Secondary | ICD-10-CM

## 2019-06-03 DIAGNOSIS — I071 Rheumatic tricuspid insufficiency: Secondary | ICD-10-CM | POA: Diagnosis not present

## 2019-06-03 DIAGNOSIS — Z0001 Encounter for general adult medical examination with abnormal findings: Secondary | ICD-10-CM | POA: Diagnosis not present

## 2019-06-03 DIAGNOSIS — R600 Localized edema: Secondary | ICD-10-CM

## 2019-06-03 DIAGNOSIS — R7309 Other abnormal glucose: Secondary | ICD-10-CM | POA: Diagnosis not present

## 2019-06-03 DIAGNOSIS — M4120 Other idiopathic scoliosis, site unspecified: Secondary | ICD-10-CM

## 2019-06-03 DIAGNOSIS — Z6823 Body mass index (BMI) 23.0-23.9, adult: Secondary | ICD-10-CM | POA: Diagnosis not present

## 2019-06-03 DIAGNOSIS — I10 Essential (primary) hypertension: Secondary | ICD-10-CM | POA: Diagnosis not present

## 2019-06-03 DIAGNOSIS — Z23 Encounter for immunization: Secondary | ICD-10-CM

## 2019-06-03 DIAGNOSIS — Z79899 Other long term (current) drug therapy: Secondary | ICD-10-CM

## 2019-06-03 DIAGNOSIS — R6889 Other general symptoms and signs: Secondary | ICD-10-CM

## 2019-06-03 DIAGNOSIS — M419 Scoliosis, unspecified: Secondary | ICD-10-CM

## 2019-06-03 DIAGNOSIS — Z0000 Encounter for general adult medical examination without abnormal findings: Secondary | ICD-10-CM

## 2019-06-03 NOTE — Patient Instructions (Addendum)
  Timothy Vaughn , Thank you for taking time to come for your Medicare Wellness Visit. I appreciate your ongoing commitment to your health goals. Please review the following plan we discussed and let me know if I can assist you in the future.   These are the goals we discussed: Goals    . Blood Pressure < 140/90    . Exercise 30 min daily     Get a stationary cycle or pedal or can do chair/wall strength exercises       This is a list of the screening recommended for you and due dates:  Health Maintenance  Topic Date Due  . Flu Shot  04/04/2019  . Tetanus Vaccine  03/05/2027  . Pneumonia vaccines  Completed     Please check your daily inhaler at home and make sure it matches your medication list on the back     Edema  Edema is when you have too much fluid in your body or under your skin. Edema may make your legs, feet, and ankles swell up. Swelling is also common in looser tissues, like around your eyes. This is a common condition. It gets more common as you get older. There are many possible causes of edema. Eating too much salt (sodium) and being on your feet or sitting for a long time can cause edema in your legs, feet, and ankles. Hot weather may make edema worse. Edema is usually painless. Your skin may look swollen or shiny. Follow these instructions at home:  Keep the swollen body part raised (elevated) above the level of your heart when you are sitting or lying down.  Do not sit still or stand for a long time.  Do not wear tight clothes. Do not wear garters on your upper legs.  Exercise your legs. This can help the swelling go down.  Wear elastic bandages or support stockings as told by your doctor.  Eat a low-salt (low-sodium) diet to reduce fluid as told by your doctor.  Depending on the cause of your swelling, you may need to limit how much fluid you drink (fluid restriction).  Take over-the-counter and prescription medicines only as told by your doctor. Contact a  doctor if:  Treatment is not working.  You have heart, liver, or kidney disease and have symptoms of edema.  You have sudden and unexplained weight gain. Get help right away if:  You have shortness of breath or chest pain.  You cannot breathe when you lie down.  You have pain, redness, or warmth in the swollen areas.  You have heart, liver, or kidney disease and get edema all of a sudden.  You have a fever and your symptoms get worse all of a sudden. Summary  Edema is when you have too much fluid in your body or under your skin.  Edema may make your legs, feet, and ankles swell up. Swelling is also common in looser tissues, like around your eyes.  Raise (elevate) the swollen body part above the level of your heart when you are sitting or lying down.  Follow your doctor's instructions about diet and how much fluid you can drink (fluid restriction). This information is not intended to replace advice given to you by your health care provider. Make sure you discuss any questions you have with your health care provider. Document Released: 02/06/2008 Document Revised: 08/23/2017 Document Reviewed: 09/07/2016 Elsevier Patient Education  2020 [employer].

## 2019-06-04 LAB — URINALYSIS, ROUTINE W REFLEX MICROSCOPIC
Bilirubin Urine: NEGATIVE
Glucose, UA: NEGATIVE
Hgb urine dipstick: NEGATIVE
Ketones, ur: NEGATIVE
Leukocytes,Ua: NEGATIVE
Nitrite: NEGATIVE
Protein, ur: NEGATIVE
Specific Gravity, Urine: 1.02 (ref 1.001–1.03)
pH: 7.5 (ref 5.0–8.0)

## 2019-06-04 LAB — CBC WITH DIFFERENTIAL/PLATELET
Absolute Monocytes: 520 cells/uL (ref 200–950)
Basophils Absolute: 20 cells/uL (ref 0–200)
Basophils Relative: 0.4 %
Eosinophils Absolute: 41 cells/uL (ref 15–500)
Eosinophils Relative: 0.8 %
HCT: 42.2 % (ref 38.5–50.0)
Hemoglobin: 14.2 g/dL (ref 13.2–17.1)
Lymphs Abs: 1673 cells/uL (ref 850–3900)
MCH: 32.5 pg (ref 27.0–33.0)
MCHC: 33.6 g/dL (ref 32.0–36.0)
MCV: 96.6 fL (ref 80.0–100.0)
MPV: 10.6 fL (ref 7.5–12.5)
Monocytes Relative: 10.2 %
Neutro Abs: 2846 cells/uL (ref 1500–7800)
Neutrophils Relative %: 55.8 %
Platelets: 133 10*3/uL — ABNORMAL LOW (ref 140–400)
RBC: 4.37 10*6/uL (ref 4.20–5.80)
RDW: 11.8 % (ref 11.0–15.0)
Total Lymphocyte: 32.8 %
WBC: 5.1 10*3/uL (ref 3.8–10.8)

## 2019-06-04 LAB — LIPID PANEL
Cholesterol: 219 mg/dL — ABNORMAL HIGH (ref <200)
HDL: 59 mg/dL (ref >=40)
LDL Cholesterol (Calc): 138 mg/dL (calc) — ABNORMAL HIGH
Non-HDL Cholesterol (Calc): 160 mg/dL (calc) — ABNORMAL HIGH (ref <130)
Total CHOL/HDL Ratio: 3.7 (calc) (ref <5.0)
Triglycerides: 106 mg/dL (ref <150)

## 2019-06-04 LAB — COMPLETE METABOLIC PANEL WITH GFR
AG Ratio: 2.1 (calc) (ref 1.0–2.5)
ALT: 19 U/L (ref 9–46)
AST: 20 U/L (ref 10–35)
Albumin: 4.4 g/dL (ref 3.6–5.1)
Alkaline phosphatase (APISO): 56 U/L (ref 35–144)
BUN/Creatinine Ratio: 34 (calc) — ABNORMAL HIGH (ref 6–22)
BUN: 32 mg/dL — ABNORMAL HIGH (ref 7–25)
CO2: 39 mmol/L — ABNORMAL HIGH (ref 20–32)
Calcium: 9.4 mg/dL (ref 8.6–10.3)
Chloride: 100 mmol/L (ref 98–110)
Creat: 0.93 mg/dL (ref 0.70–1.11)
GFR, Est African American: 85 mL/min/{1.73_m2} (ref >=60)
GFR, Est Non African American: 74 mL/min/{1.73_m2} (ref >=60)
Globulin: 2.1 g/dL (calc) (ref 1.9–3.7)
Glucose, Bld: 109 mg/dL — ABNORMAL HIGH (ref 65–99)
Potassium: 4.4 mmol/L (ref 3.5–5.3)
Sodium: 142 mmol/L (ref 135–146)
Total Bilirubin: 0.6 mg/dL (ref 0.2–1.2)
Total Protein: 6.5 g/dL (ref 6.1–8.1)

## 2019-06-04 LAB — TSH: TSH: 0.82 mIU/L (ref 0.40–4.50)

## 2019-06-04 LAB — MAGNESIUM: Magnesium: 2 mg/dL (ref 1.5–2.5)

## 2019-06-26 DIAGNOSIS — L570 Actinic keratosis: Secondary | ICD-10-CM | POA: Diagnosis not present

## 2019-06-26 DIAGNOSIS — L821 Other seborrheic keratosis: Secondary | ICD-10-CM | POA: Diagnosis not present

## 2019-06-26 DIAGNOSIS — D485 Neoplasm of uncertain behavior of skin: Secondary | ICD-10-CM | POA: Diagnosis not present

## 2019-06-26 DIAGNOSIS — Z85828 Personal history of other malignant neoplasm of skin: Secondary | ICD-10-CM | POA: Diagnosis not present

## 2019-06-26 DIAGNOSIS — C44319 Basal cell carcinoma of skin of other parts of face: Secondary | ICD-10-CM | POA: Diagnosis not present

## 2019-06-26 DIAGNOSIS — L814 Other melanin hyperpigmentation: Secondary | ICD-10-CM | POA: Diagnosis not present

## 2019-06-26 DIAGNOSIS — D225 Melanocytic nevi of trunk: Secondary | ICD-10-CM | POA: Diagnosis not present

## 2019-08-31 NOTE — Progress Notes (Signed)
    Subjective:    Patient ID: Timothy Vaughn, male    DOB: Jan 24, 1929, 85 y.o.   MRN: [id number]  HPI   Patient is a very nice 85 yo MWM with HTN, HLD, Pre-Diabetes,  Restrictive Lung Disease  and Vitamin D Deficiency who presents with with a raised lesion of his dorsal Left forearm which has recently rapidly increased in size over the last month. His hypertensive systems review is negative.   Medication Sig  . PROAIR RESPICLICK Inhale 2 Act  every 6 hours as needed  . VITAMIN C Take 1 tablet by mouth daily. Takes 400 mg daily.  Marland Kitchen aspirin 81 MG tablet Take 81 mg by mouth daily.  Marland Kitchen VITAMIN D Take 8,000 Int'l Units by mouth daily.   . furosemide  40 MG tablet TAKE 1 TABLET  DAILY AS NEEDED  . Multiple Vitamin  Take 1 tablet by mouth daily.   . Omega-3 FISH OIL Take 1 capsule by mouth daily.   . potassium chloride  20 MEQ tab TAKE 2 TABLETS BY MOUTH DAILY.  Marland Kitchen ANORO ELLIPTA 62.5-25 Inhale 1 puff into the lungs daily.    Allergies  Allergen Reactions  . Lipitor [Atorvastatin] Other (See Comments)    Severe NIV  . Niaspan [Niacin Er] Itching   Past Medical History:  Diagnosis Date  . Cancer [facility])    prostate  . Hyperlipidemia   . Hypertension   . Pre-diabetes   . Vitamin D deficiency    Review of Systems    10 point systems review negative except as above.    Objective:   Physical Exam  BP (!) 176/82   Pulse 60   Temp (!) 97.2 F (36.2 C)   Resp 16   Ht 5' 3.75" (1.619 m)   Wt 135 lb 12.8 oz (61.6 kg)   BMI 23.49 kg/m   HEENT - WNL. Neck - supple.  Chest - Severe roto kyphoscoliosis. Clear equal BS. Cor - Nl HS. RRR w/o sig MGR. PP 1(+). No edema. MS- FROM w/o deformities.  Gait Nl. Neuro -  Nl w/o focal abnormalities. Skin -  There is a 15 mm x 15 mm fleshy pink raised centrally umbilicated lesion of the dorsal Left forearm.  Procedure  (CPT [id number])     After informed consent and aseptic prep with alcohol the above lesion was locally anesthetized with 2 ml of  Marcaine 0.5% sub-cut. Then the lesion sharply excised with a #10 scapel to the deep sub-cut in an elliptical fashion.  # 5 vertical mattress sutures with Proline 3-0 were applied to align & evert the wound edges.  Then # 6 running lock sutures were applied to secure alignment of the wound edges.  Then the wound was cleaned with soapy cotton s4 x 4's and a 4" x 6 " Tegaderm was applied.  Lesion was sent for path analysis. Patient was instructed in post-op wound care & to return in 12 days for suture removal.     Assessment & Plan:   1. Essential hypertension  2. Skin cancer of arm, left  - Derm pathology

## 2019-09-01 ENCOUNTER — Other Ambulatory Visit: Payer: Self-pay | Admitting: Internal Medicine

## 2019-09-01 ENCOUNTER — Encounter: Payer: Self-pay | Admitting: Internal Medicine

## 2019-09-01 ENCOUNTER — Other Ambulatory Visit: Payer: Self-pay

## 2019-09-01 ENCOUNTER — Ambulatory Visit (INDEPENDENT_AMBULATORY_CARE_PROVIDER_SITE_OTHER): Payer: Medicare Other | Admitting: Internal Medicine

## 2019-09-01 VITALS — BP 176/82 | HR 60 | Temp 97.2°F | Resp 16 | Ht 63.75 in | Wt 135.8 lb

## 2019-09-01 DIAGNOSIS — C44609 Unspecified malignant neoplasm of skin of left upper limb, including shoulder: Secondary | ICD-10-CM | POA: Diagnosis not present

## 2019-09-01 DIAGNOSIS — I10 Essential (primary) hypertension: Secondary | ICD-10-CM

## 2019-09-14 ENCOUNTER — Other Ambulatory Visit: Payer: Self-pay

## 2019-09-14 ENCOUNTER — Encounter: Payer: Self-pay | Admitting: Internal Medicine

## 2019-09-14 ENCOUNTER — Ambulatory Visit: Payer: Medicare Other | Admitting: Internal Medicine

## 2019-09-14 VITALS — BP 138/64 | HR 18 | Temp 97.4°F | Resp 18 | Ht 63.75 in | Wt 136.2 lb

## 2019-09-14 DIAGNOSIS — C44609 Unspecified malignant neoplasm of skin of left upper limb, including shoulder: Secondary | ICD-10-CM

## 2019-09-14 NOTE — Patient Instructions (Signed)
-   Link to sign up at [employer] site for the Covid-19 vaccine   [email or web address]   Or call [phone]

## 2019-09-14 NOTE — Progress Notes (Signed)
  Patient returns s/p excision of a skin cancer of the dorsal Lt forearm. Path showed clear margins.   Wound appears well healed w/o signs of infection and all sutures removed .

## 2019-09-23 ENCOUNTER — Ambulatory Visit (INDEPENDENT_AMBULATORY_CARE_PROVIDER_SITE_OTHER): Payer: Medicare Other | Admitting: Internal Medicine

## 2019-09-23 ENCOUNTER — Encounter: Payer: Self-pay | Admitting: Internal Medicine

## 2019-09-23 ENCOUNTER — Other Ambulatory Visit: Payer: Self-pay

## 2019-09-23 VITALS — BP 122/70 | HR 64 | Temp 97.3°F | Resp 16 | Ht 63.0 in | Wt 133.4 lb

## 2019-09-23 DIAGNOSIS — R7303 Prediabetes: Secondary | ICD-10-CM

## 2019-09-23 DIAGNOSIS — Z125 Encounter for screening for malignant neoplasm of prostate: Secondary | ICD-10-CM

## 2019-09-23 DIAGNOSIS — R7309 Other abnormal glucose: Secondary | ICD-10-CM | POA: Diagnosis not present

## 2019-09-23 DIAGNOSIS — I714 Abdominal aortic aneurysm, without rupture, unspecified: Secondary | ICD-10-CM

## 2019-09-23 DIAGNOSIS — N138 Other obstructive and reflux uropathy: Secondary | ICD-10-CM | POA: Diagnosis not present

## 2019-09-23 DIAGNOSIS — I10 Essential (primary) hypertension: Secondary | ICD-10-CM | POA: Diagnosis not present

## 2019-09-23 DIAGNOSIS — Z1211 Encounter for screening for malignant neoplasm of colon: Secondary | ICD-10-CM

## 2019-09-23 DIAGNOSIS — Z79899 Other long term (current) drug therapy: Secondary | ICD-10-CM

## 2019-09-23 DIAGNOSIS — J984 Other disorders of lung: Secondary | ICD-10-CM

## 2019-09-23 DIAGNOSIS — Z136 Encounter for screening for cardiovascular disorders: Secondary | ICD-10-CM

## 2019-09-23 DIAGNOSIS — E782 Mixed hyperlipidemia: Secondary | ICD-10-CM

## 2019-09-23 DIAGNOSIS — N401 Enlarged prostate with lower urinary tract symptoms: Secondary | ICD-10-CM | POA: Diagnosis not present

## 2019-09-23 DIAGNOSIS — Z8249 Family history of ischemic heart disease and other diseases of the circulatory system: Secondary | ICD-10-CM

## 2019-09-23 DIAGNOSIS — E559 Vitamin D deficiency, unspecified: Secondary | ICD-10-CM | POA: Diagnosis not present

## 2019-09-23 NOTE — Patient Instructions (Signed)

## 2019-09-23 NOTE — Progress Notes (Addendum)
Comprehensive Evaluation & Examination     This very nice 86 y.o. MWM presents for a  comprehensive evaluation and management of multiple medical co-morbidities.  Patient has been followed for HTN, HLD, T2_NIDDM  Prediabetes and Vitamin D Deficiency.  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 He relates   DOE and O2 desats from mid 90's - his resting baseline to the mid 80's with minimal walking.     HTN predates since 2004. Patient's BP has been controlled at home.  Today's BP is at goal - 122/70. Patient denies any cardiac symptoms as chest pain, palpitations, shortness of breath, dizziness or ankle swelling.     Patient's hyperlipidemia is not controlled with diet and medications. Patient denies myalgias or other medication SE's. Last lipids were not at goal & not aggressively pursued due to age:  Lab Results  Component Value Date   CHOL 219 (H) 06/03/2019   HDL 59 06/03/2019   LDLCALC 138 (H) 06/03/2019   TRIG 106 06/03/2019   CHOLHDL 3.7 06/03/2019       Patient has hx/o PreDiabetes (A1c 5.8% / 2012)and then T2_NIDDM (A1c 6.7%/Jan 2017) and with weight loss , his A1c's have Normalized. He also has CKD2 (GFR 78)  and patient denies reactive hypoglycemic symptoms, visual blurring, diabetic polys or paresthesias. Last A1c was Normal & at goal:  Lab Results  Component Value Date   HGBA1C 5.4 08/14/2018        Finally, patient has history of Vitamin D Deficiency (35"/2012)  and last vitamin D was at goal:  Lab Results  Component Value Date   VD25OH 71 03/02/2019    Current Outpatient Medications on File Prior to Visit  Medication Sig  . Albuterol Sulfate (PROAIR RESPICLICK) 123XX123 (90 Base) MCG/ACT AEPB Inhale 2 Act into the lungs every 6 (six) hours as needed (for shortness of breath).  . Ascorbic Acid (VITAMIN C PO) Take 1 tablet by mouth daily. Takes 400 mg daily.  Marland Kitchen aspirin 81 MG tablet Take 81 mg by mouth daily.  .  Cholecalciferol (VITAMIN D PO) Take 8,000 Int'l Units by mouth daily.   . furosemide (LASIX) 40 MG tablet TAKE 1 TABLET (40 MG TOTAL) BY MOUTH DAILY AS NEEDED FOR FLUID OR EDEMA.  . Multiple Vitamin (MULTI VITAMIN DAILY PO) Take 1 tablet by mouth daily.   . Omega-3 Fatty Acids (FISH OIL PO) Take 1 capsule by mouth daily.   . potassium chloride SA (K-DUR,KLOR-CON) 20 MEQ tablet TAKE 2 TABLETS BY MOUTH DAILY.  Marland Kitchen umeclidinium-vilanterol (ANORO ELLIPTA) 62.5-25 MCG/INH AEPB Inhale 1 puff into the lungs daily. Make sure you rinse your mouth after each use.   No current facility-administered medications on file prior to visit.   Allergies  Allergen Reactions  . Lipitor [Atorvastatin] Other (See Comments)    Severe NIV  . Niaspan [Niacin Er] Itching   Past Medical History:  Diagnosis Date  . Cancer [facility])    prostate  . Hyperlipidemia   . Hypertension   . Pre-diabetes   . Vitamin D deficiency    Health Maintenance  Topic Date Due  . TETANUS/TDAP  03/05/2027  . INFLUENZA VACCINE  Completed  . PNA vac Low Risk Adult  Completed   Immunization History  Administered Date(s) Administered  . DTaP 09/03/2005  . Influenza Whole 05/20/2013  . Influenza, High Dose Seasonal PF 06/04/2014, 06/09/2015, 05/03/2016, 05/28/2017, 05/30/2018, 06/03/2019  . Moderna SARS-COVID-2 Vaccination 09/20/2019  . Pneumococcal Conjugate-13 10/21/2015  . Pneumococcal  Polysaccharide-23 02/05/2012  . Td 03/04/2017  . Zoster 09/25/2005   Last Colon -  Past Surgical History:  Procedure Laterality Date  . PROSTATECTOMY  2001   Family History  Problem Relation Age of Onset  . Heart disease Mother   . COPD Mother   . Cancer Father        colon  . Alzheimer's disease Father   . Heart disease Father   . Cancer Brother        stomach  . Stroke Maternal Grandfather   . Diabetes Maternal Grandfather    Social History   Socioeconomic History  . Marital status: Married    Spouse name: Inez Catalina  . Number of  children: 1 son & 1 daughter  Occupational History  . Not on file  Tobacco Use  . Smoking status: Never Smoker  . Smokeless tobacco: Never Used  Substance and Sexual Activity  . Alcohol use: No  . Drug use: No  . Sexual activity: Not on file     ROS Constitutional: Denies fever, chills, weight loss/gain, headaches, insomnia,  night sweats or change in appetite. Does c/o fatigue. Eyes: Denies redness, blurred vision, diplopia, discharge, itchy or watery eyes.  ENT: Denies discharge, congestion, post nasal drip, epistaxis, sore throat, earache, hearing loss, dental pain, Tinnitus, Vertigo, Sinus pain or snoring.  Cardio: Denies chest pain, palpitations, irregular heartbeat, syncope, dyspnea, diaphoresis, orthopnea, PND, claudication or edema Respiratory: denies cough, dyspnea, DOE, pleurisy, hoarseness, laryngitis or wheezing.  Gastrointestinal: Denies dysphagia, heartburn, reflux, water brash, pain, cramps, nausea, vomiting, bloating, diarrhea, constipation, hematemesis, melena, hematochezia, jaundice or hemorrhoids Genitourinary: Denies dysuria, frequency, urgency, nocturia, hesitancy, discharge, hematuria or flank pain Musculoskeletal: Denies arthralgia, myalgia, stiffness, Jt. Swelling, pain, limp or strain/sprain. Denies Falls. Skin: Denies puritis, rash, hives, warts, acne, eczema or change in skin lesion Neuro: No weakness, tremor, incoordination, spasms, paresthesia or pain Psychiatric: Denies confusion, memory loss or sensory loss. Denies Depression. Endocrine: Denies change in weight, skin, hair change, nocturia, and paresthesia, diabetic polys, visual blurring or hyper / hypo glycemic episodes.  Heme/Lymph: No excessive bleeding, bruising or enlarged lymph nodes.  Physical Exam  BP 122/70   Pulse 64   Temp (!) 97.3 F (36.3 C)   Resp 16   Ht 5\' 3"  (1.6 m)   Wt 133 lb 6.4 oz (60.5 kg)   BMI 23.63 kg/m   General Appearance: Well nourished and well groomed and in no  apparent distress.  Eyes: PERRLA, EOMs, conjunctiva no swelling or erythema, normal fundi and vessels. Sinuses: No frontal/maxillary tenderness ENT/Mouth: EACs patent / TMs  nl. Nares clear without erythema, swelling, mucoid exudates. Oral hygiene is good. No erythema, swelling, or exudate. Tongue normal, non-obstructing. Tonsils not swollen or erythematous. Hearing normal.  Neck: Supple, thyroid not palpable. No bruits, nodes or JVD. Respiratory: Respiratory effort normal.  BS equal and clear bilateral without rales, rhonci, wheezing or stridor. Cardio: Heart sounds are normal with regular rate and rhythm and no murmurs, rubs or gallops. Peripheral pulses are normal and equal bilaterally without edema. No aortic or femoral bruits. Chest: Severe thoracolumbar kyphohrotoscoliosisandmoderate severe chest deformity.   Abdomen: Soft, with Nl bowel sounds. Nontender, no guarding, rebound, hernias, masses, or organomegaly.  Lymphatics: Non tender without lymphadenopathy.  Musculoskeletal: Full ROM all peripheral extremities, joint stability, 5/5 strength, and normal gait. Skin: Warm and dry without rashes, lesions, cyanosis, clubbing or  ecchymosis.  Neuro: Cranial nerves intact, reflexes equal bilaterally. Normal muscle tone, no cerebellar symptoms. Sensation intact.  Pysch: Alert and oriented X 3 with normal affect, insight and judgment appropriate.   Assessment and Plan  1. Essential hypertension  - EKG 12-Lead - [place], RETROPERITNL ABD,  LTD - Urinalysis, Routine w reflex microscopic - Microalbumin / creatinine urine ratio - CBC with Differential/Platelet - COMPLETE METABOLIC PANEL WITH GFR - Magnesium - TSH  2. Hyperlipidemia, mixed  - EKG 12-Lead - [place], RETROPERITNL ABD,  LTD - TSH  3. Other abnormal glucose  - EKG 12-Lead - [place], RETROPERITNL ABD,  LTD - Hemoglobin A1c - Insulin, random  4. Vitamin D deficiency  - VITAMIN D 25 Hydroxy  5. Prediabetes  - EKG 12-Lead - [place],  RETROPERITNL ABD,  LTD - Hemoglobin A1c - Insulin, random  6. Restrictive lung disease due to kyphoscoliosis   7. Screening for colorectal cancer  - POC Hemoccult Bld/Stl   8. BPH with obstruction/lower urinary tract symptoms  - PSA  9. Prostate cancer screening  - PSA  10. Screening for ischemic heart disease  - EKG 12-Lead  11. FHx: heart disease  - EKG 12-Lead - [place], RETROPERITNL ABD,  LTD  12. AAA (abdominal aortic aneurysm) (HCC)  - [place], RETROPERITNL ABD,  LTD  13. Screening for AAA (aortic abdominal aneurysm)  - [place], RETROPERITNL ABD,  LTD  14. Medication management  - Urinalysis, Routine w reflex microscopic - Microalbumin / creatinine urine ratio - CBC with Differential/Platelet - COMPLETE METABOLIC PANEL WITH GFR - Magnesium - TSH - Hemoglobin A1c - Insulin, random - VITAMIN D 25 Hydroxy        Patient was counseled in prudent diet, weight control to achieve/maintain BMI less than 25, BP monitoring, regular exercise and medications as discussed.  Discussed med effects and SE's. Routine screening labs and tests as requested with regular follow-up as recommended. Over 40 minutes of exam, counseling, chart review and high complex critical decision making was performed   Kirtland Bouchard, MD

## 2019-09-24 LAB — HEMOGLOBIN A1C
Hgb A1c MFr Bld: 5.7 % of total Hgb — ABNORMAL HIGH (ref <5.7)
Mean Plasma Glucose: 117 (calc)
eAG (mmol/L): 6.5 (calc)

## 2019-09-24 LAB — COMPLETE METABOLIC PANEL WITH GFR
AG Ratio: 2 (calc) (ref 1.0–2.5)
ALT: 16 U/L (ref 9–46)
AST: 22 U/L (ref 10–35)
Albumin: 4.3 g/dL (ref 3.6–5.1)
Alkaline phosphatase (APISO): 58 U/L (ref 35–144)
BUN/Creatinine Ratio: 28 (calc) — ABNORMAL HIGH (ref 6–22)
BUN: 31 mg/dL — ABNORMAL HIGH (ref 7–25)
CO2: 35 mmol/L — ABNORMAL HIGH (ref 20–32)
Calcium: 9.4 mg/dL (ref 8.6–10.3)
Chloride: 101 mmol/L (ref 98–110)
Creat: 1.09 mg/dL (ref 0.70–1.11)
GFR, Est African American: 70 mL/min/{1.73_m2} (ref >=60)
GFR, Est Non African American: 60 mL/min/{1.73_m2} (ref >=60)
Globulin: 2.1 g/dL (calc) (ref 1.9–3.7)
Glucose, Bld: 97 mg/dL (ref 65–99)
Potassium: 4.9 mmol/L (ref 3.5–5.3)
Sodium: 143 mmol/L (ref 135–146)
Total Bilirubin: 0.5 mg/dL (ref 0.2–1.2)
Total Protein: 6.4 g/dL (ref 6.1–8.1)

## 2019-09-24 LAB — CBC WITH DIFFERENTIAL/PLATELET
Absolute Monocytes: 743 cells/uL (ref 200–950)
Basophils Absolute: 11 cells/uL (ref 0–200)
Basophils Relative: 0.2 %
Eosinophils Absolute: 28 cells/uL (ref 15–500)
Eosinophils Relative: 0.5 %
HCT: 42.3 % (ref 38.5–50.0)
Hemoglobin: 14.1 g/dL (ref 13.2–17.1)
Lymphs Abs: 1513 cells/uL (ref 850–3900)
MCH: 32.4 pg (ref 27.0–33.0)
MCHC: 33.3 g/dL (ref 32.0–36.0)
MCV: 97.2 fL (ref 80.0–100.0)
MPV: 10.9 fL (ref 7.5–12.5)
Monocytes Relative: 13.5 %
Neutro Abs: 3207 cells/uL (ref 1500–7800)
Neutrophils Relative %: 58.3 %
Platelets: 128 10*3/uL — ABNORMAL LOW (ref 140–400)
RBC: 4.35 10*6/uL (ref 4.20–5.80)
RDW: 12 % (ref 11.0–15.0)
Total Lymphocyte: 27.5 %
WBC: 5.5 10*3/uL (ref 3.8–10.8)

## 2019-09-24 LAB — MICROALBUMIN / CREATININE URINE RATIO
Creatinine, Urine: 119 mg/dL (ref 20–320)
Microalb Creat Ratio: 34 mcg/mg creat — ABNORMAL HIGH (ref <30)
Microalb, Ur: 4.1 mg/dL

## 2019-09-24 LAB — TSH: TSH: 0.79 mIU/L (ref 0.40–4.50)

## 2019-09-24 LAB — URINALYSIS, ROUTINE W REFLEX MICROSCOPIC
Bacteria, UA: NONE SEEN /HPF
Bilirubin Urine: NEGATIVE
Glucose, UA: NEGATIVE
Hgb urine dipstick: NEGATIVE
Hyaline Cast: NONE SEEN /LPF
Ketones, ur: NEGATIVE
Leukocytes,Ua: NEGATIVE
Nitrite: NEGATIVE
RBC / HPF: NONE SEEN /HPF (ref 0–2)
Specific Gravity, Urine: 1.022 (ref 1.001–1.03)
Squamous Epithelial / HPF: NONE SEEN /HPF (ref <=5)
WBC, UA: NONE SEEN /HPF (ref 0–5)
pH: 5.5 (ref 5.0–8.0)

## 2019-09-24 LAB — PSA: PSA: <0.1 ng/mL (ref <=4.0)

## 2019-09-24 LAB — MAGNESIUM: Magnesium: 2 mg/dL (ref 1.5–2.5)

## 2019-09-24 LAB — INSULIN, RANDOM: Insulin: 6.7 u[IU]/mL

## 2019-09-24 LAB — VITAMIN D 25 HYDROXY (VIT D DEFICIENCY, FRACTURES): Vit D, 25-Hydroxy: 78 ng/mL (ref 30–100)

## 2019-09-26 ENCOUNTER — Encounter: Payer: Self-pay | Admitting: Internal Medicine

## 2019-09-28 DIAGNOSIS — L821 Other seborrheic keratosis: Secondary | ICD-10-CM | POA: Diagnosis not present

## 2019-09-28 DIAGNOSIS — Z85828 Personal history of other malignant neoplasm of skin: Secondary | ICD-10-CM | POA: Diagnosis not present

## 2019-09-28 DIAGNOSIS — L570 Actinic keratosis: Secondary | ICD-10-CM | POA: Diagnosis not present

## 2019-09-28 DIAGNOSIS — D2262 Melanocytic nevi of left upper limb, including shoulder: Secondary | ICD-10-CM | POA: Diagnosis not present

## 2019-09-28 DIAGNOSIS — D225 Melanocytic nevi of trunk: Secondary | ICD-10-CM | POA: Diagnosis not present

## 2019-09-28 DIAGNOSIS — L910 Hypertrophic scar: Secondary | ICD-10-CM | POA: Diagnosis not present

## 2019-09-28 DIAGNOSIS — D485 Neoplasm of uncertain behavior of skin: Secondary | ICD-10-CM | POA: Diagnosis not present

## 2019-09-29 ENCOUNTER — Other Ambulatory Visit: Payer: Self-pay | Admitting: Adult Health Nurse Practitioner

## 2019-09-29 DIAGNOSIS — M419 Scoliosis, unspecified: Secondary | ICD-10-CM

## 2019-09-29 DIAGNOSIS — J984 Other disorders of lung: Secondary | ICD-10-CM

## 2019-09-29 MED ORDER — ALBUTEROL SULFATE (2.5 MG/3ML) 0.083% IN NEBU: 2.5000 mg | INHALATION_SOLUTION | Freq: Four times a day (QID) | RESPIRATORY_TRACT | 12 refills | Status: AC | PRN

## 2019-10-01 ENCOUNTER — Telehealth: Payer: Self-pay | Admitting: Internal Medicine

## 2019-10-01 NOTE — Telephone Encounter (Signed)
patient called,  Message taken: requesting status of oxygen order. Tried 3 times to return call, busy signal each time. There is not an order for oxygen, last order was 2018. Confirmed with pharmacy,  rx sent to [facility] for Albuterol, it is ready for pick up.

## 2019-10-29 NOTE — Progress Notes (Addendum)
Assessment and Plan:  Timothy Vaughn was seen today for orders.  Diagnoses and all orders for this visit:  Restrictive lung disease due to kyphoscoliosis Shortness of breath Encounter for evaluation due to recertification needs of overnight O2 Has been on 2L O2 at night since 2017 following CAP with resp failure O2 walk test initiated due to reports of exertional dyspnea, did not meet criteria for portable oxygen Will coordinate for repeat overnight oximetry to evaluate for ongoing need Also with significant kyphoscoliosis; will order non-invasive ventilator to target Vt which anticipate will benefit him significamtly Try anoro daily x 1 week compared to hold x 1 week; if no significant improvement, recommend he STOP medication; likely dyspnea r/t severe kyphoscoliosis; no hx of asthma or COPD; limited anticipated benefit from inhaler unless acute respiratory event; patient to call with results, or can discuss at upcoming follow up  -     Pulse oximetry, overnight; Future  Further disposition pending results of labs. Discussed med's effects and SE's.   Over 15 minutes of exam, counseling, chart review, and critical decision making was performed.   Future Appointments  Date Time Provider [place]  12/31/2019  2:30 PM Liane Comber, NP [facility] None  03/31/2020  2:30 PM Unk Pinto, MD [facility] None  10/11/2020  3:00 PM Unk Pinto, MD [facility] None    ------------------------------------------------------------------------------------------------------------------   HPI BP 126/70   Pulse (!) 53   Temp 97.7 F (36.5 C)   Ht 5\' 3"  (1.6 m)   Wt 135 lb (61.2 kg)   SpO2 93%   BMI 23.91 kg/m   86 y.o.male with hx of restrictive lung disease secondary to kyphoscoliosis presents for evalauation due to need for renewal of home oxygen. He reports following his admission for CAP with acute respiratory failure in 2017, reports has been on 2L O2 at night since that time. He  does report he has chronic exertional dyspnea which he attributes to restrictive lung disease, has had chronically, worse since pneumonia admission in 2017 but reports generally had been stable until recently, feels some mild increase in dyspnea with some daily activities (pulling garbage cans to road) in recent months. Denies CP, palpitations, dizziness. He is currently prescribed anoro which he reports taking daily. Unsure if this helps, has been on several years. Can't tell difference even if he forgets to take.    6 min O2 walk test: 92%  Past Medical History:  Diagnosis Date  . Cancer [facility])    prostate  . Hyperlipidemia   . Hypertension   . Pre-diabetes   . Vitamin D deficiency      Allergies  Allergen Reactions  . Lipitor [Atorvastatin] Other (See Comments)    Severe NIV  . Niaspan [Niacin Er] Itching    Current Outpatient Medications on File Prior to Visit  Medication Sig  . albuterol (PROVENTIL) (2.5 MG/3ML) 0.083% nebulizer solution Take 3 mLs (2.5 mg total) by nebulization every 6 (six) hours as needed for wheezing or shortness of breath.  . Albuterol Sulfate (PROAIR RESPICLICK) 123XX123 (90 Base) MCG/ACT AEPB Inhale 2 Act into the lungs every 6 (six) hours as needed (for shortness of breath).  . Ascorbic Acid (VITAMIN C PO) Take 1 tablet by mouth daily. Takes 400 mg daily.  Marland Kitchen aspirin 81 MG tablet Take 81 mg by mouth daily.  . Cholecalciferol (VITAMIN D PO) Take 8,000 Int'l Units by mouth daily.   . furosemide (LASIX) 40 MG tablet TAKE 1 TABLET (40 MG TOTAL) BY MOUTH DAILY AS  NEEDED FOR FLUID OR EDEMA.  . Multiple Vitamin (MULTI VITAMIN DAILY PO) Take 1 tablet by mouth daily.   . Omega-3 Fatty Acids (FISH OIL PO) Take 1 capsule by mouth daily.   . potassium chloride SA (K-DUR,KLOR-CON) 20 MEQ tablet TAKE 2 TABLETS BY MOUTH DAILY.  Marland Kitchen umeclidinium-vilanterol (ANORO ELLIPTA) 62.5-25 MCG/INH AEPB Inhale 1 puff into the lungs daily. Make sure you rinse your mouth after each use.    No current facility-administered medications on file prior to visit.    ROS: all negative except above.   Physical Exam:  BP 126/70   Pulse (!) 53   Temp 97.7 F (36.5 C)   Ht 5\' 3"  (1.6 m)   Wt 135 lb (61.2 kg)   SpO2 93%   BMI 23.91 kg/m   General appearance: alert, no distress, WD/WN, male Eyes: PERRLA, conjunctiva no swelling or erythema ENT/Mouth: Mask in place; Somewhat HOH.  Neck: Supple, thyroid normal.  Respiratory: Respiratory effort normal, distant lung sounds due to kyphoscoliosis but no wheezing, rales, rhonchi. Cardio: RRR, bigeminy/extra beats, with no MRGs. Intact peripheral pulses, 1+ pitting pretibial edema bilaterally .  Abdomen: Soft, + BS,  Non tender, no guarding, rebound, hernias, masses. Lymphatics: Non tender without lymphadenopathy.  Musculoskeletal: Severe kyphoscoliosis, 4/5 strength, Normal gait Skin: Warm, dry without rashes,  Neuro: Cranial nerves intact. Normal muscle tone, no cerebellar symptoms. Psych: Awake and oriented X 3, normal affect, Insight and Judgment appropriate.    Izora Ribas, NP 5:49 PM [facility]

## 2019-11-02 ENCOUNTER — Encounter: Payer: Self-pay | Admitting: Adult Health

## 2019-11-02 ENCOUNTER — Other Ambulatory Visit: Payer: Self-pay

## 2019-11-02 ENCOUNTER — Ambulatory Visit (INDEPENDENT_AMBULATORY_CARE_PROVIDER_SITE_OTHER): Payer: Medicare Other | Admitting: Adult Health

## 2019-11-02 VITALS — BP 126/70 | HR 53 | Temp 97.7°F | Ht 63.0 in | Wt 135.0 lb

## 2019-11-02 DIAGNOSIS — R0602 Shortness of breath: Secondary | ICD-10-CM | POA: Diagnosis not present

## 2019-11-02 DIAGNOSIS — M419 Scoliosis, unspecified: Secondary | ICD-10-CM | POA: Diagnosis not present

## 2019-11-02 DIAGNOSIS — J984 Other disorders of lung: Secondary | ICD-10-CM

## 2019-11-09 ENCOUNTER — Other Ambulatory Visit: Payer: Self-pay

## 2019-11-09 ENCOUNTER — Telehealth: Payer: Self-pay | Admitting: Adult Health

## 2019-11-09 DIAGNOSIS — Z1212 Encounter for screening for malignant neoplasm of rectum: Secondary | ICD-10-CM

## 2019-11-09 DIAGNOSIS — Z1211 Encounter for screening for malignant neoplasm of colon: Secondary | ICD-10-CM

## 2019-11-09 LAB — POC HEMOCCULT BLD/STL (HOME/3-CARD/SCREEN)
Card #2 Fecal Occult Blod, POC: NEGATIVE
Card #3 Fecal Occult Blood, POC: NEGATIVE
Fecal Occult Blood, POC: NEGATIVE

## 2019-11-09 NOTE — Telephone Encounter (Signed)
Overnight Oximetry Test Order needs the dx reviewed.  Please review, edit/add dx, and I will fax to [place].   Per Caryl Pina corrected dx and faxed to [facility]

## 2019-11-10 DIAGNOSIS — Z1211 Encounter for screening for malignant neoplasm of colon: Secondary | ICD-10-CM | POA: Diagnosis not present

## 2019-11-10 DIAGNOSIS — Z1212 Encounter for screening for malignant neoplasm of rectum: Secondary | ICD-10-CM | POA: Diagnosis not present

## 2019-11-24 ENCOUNTER — Telehealth: Payer: Self-pay | Admitting: Adult Health

## 2019-11-24 NOTE — Telephone Encounter (Signed)
Per Timothy Vaughn, faxed [employer] order for non invasive home vent. also perform OOT for night time oxygen qualify.

## 2019-11-27 DIAGNOSIS — R0602 Shortness of breath: Secondary | ICD-10-CM | POA: Diagnosis not present

## 2019-12-01 ENCOUNTER — Encounter: Payer: Self-pay | Admitting: Internal Medicine

## 2019-12-01 DIAGNOSIS — M419 Scoliosis, unspecified: Secondary | ICD-10-CM

## 2019-12-08 DIAGNOSIS — R0902 Hypoxemia: Secondary | ICD-10-CM | POA: Diagnosis not present

## 2019-12-08 DIAGNOSIS — J449 Chronic obstructive pulmonary disease, unspecified: Secondary | ICD-10-CM | POA: Diagnosis not present

## 2019-12-28 DIAGNOSIS — L570 Actinic keratosis: Secondary | ICD-10-CM | POA: Diagnosis not present

## 2019-12-28 DIAGNOSIS — Z85828 Personal history of other malignant neoplasm of skin: Secondary | ICD-10-CM | POA: Diagnosis not present

## 2019-12-28 DIAGNOSIS — D044 Carcinoma in situ of skin of scalp and neck: Secondary | ICD-10-CM | POA: Diagnosis not present

## 2019-12-28 DIAGNOSIS — L821 Other seborrheic keratosis: Secondary | ICD-10-CM | POA: Diagnosis not present

## 2019-12-28 DIAGNOSIS — D1801 Hemangioma of skin and subcutaneous tissue: Secondary | ICD-10-CM | POA: Diagnosis not present

## 2019-12-30 ENCOUNTER — Encounter: Payer: Self-pay | Admitting: Adult Health

## 2019-12-30 NOTE — Progress Notes (Signed)
FOLLOW UP  Assessment and Plan:   Hypertension Well controlled with current medications; taking lasix 5 days weekly and stable for several years, however with night time dizziness will try cutting back to 3-4 days/week, follow up in 2 weeks Monitor blood pressure at home; patient to call if consistently greater than 130/80 Continue DASH diet.   Reminder to go to the ER if any CP, SOB, nausea, dizziness, severe HA, changes vision/speech, left arm numbness and tingling and jaw pain.  Cholesterol No longer treated by medications after shared decision making due to advancing age and otherwise low risk history Continue low cholesterol diet and exercise.  Check lipid panel.   History of Prediabetes Recent A1Cs at goal Continue diet and exercise.  Perform daily foot/skin check, notify office of any concerning changes.  Defer A1C; check CMP  Normal BMI -23 Long discussion about diet and exercise Recommended diet heavy in fruits and veggies and low in animal meats, cheeses, and dairy products, appropriate calorie intake Discussed ideal weight range for height  Will follow up in 3 months  Vitamin D Def At goal at last visit; continue supplementation to maintain goal of 70-100 Defer Vit D level  Restrictive lung disease due to kyphoscoliosis Did not meet criteria for portable oxygen with recent O2 walk test; he is on nocturnal 2L O2 Try anoro daily x 1 week, stop x 1 week and compare; d/c if no significant benefit, resume previous PRN albuterol   Continue diet and meds as discussed. Further disposition pending results of labs. Discussed med's effects and SE's.   Over 30 minutes of exam, counseling, chart review, and critical decision making was performed.   Future Appointments  Date Time Provider [place]  03/31/2020  2:30 PM Unk Pinto, MD [facility] None  10/11/2020  3:00 PM Unk Pinto, MD [facility] None     ----------------------------------------------------------------------------------------------------------------------   HPI 86 y.o. male  presents for 3 month follow up on hypertension, cholesterol, history of prediabetes/glucose management and vitamin D deficiency.   Patient  has severe restrictive Lung Disease consequent of severe thoracolumbar kypho-rotoscoliosis. He does endorse DOE after walking short distances and is documented in the past that O2 desats from his resting baseline in the mid 90's to the mid 80's with activity, however recent O2 walk test did not meet criteria for potable oxygen. He did note benefit with albuterol prior to exertion and was prescribed anoro, reports taking this only some days, unsure if this helps. He does have nocturnal hypoxia per recent overnight oximetry ordered for re certification and wears ? 2L at night (he is unsure of exact amount, on preset per home oxygen provider, also has new "hood" device which was recommended. Previously on nasal cannula.   He reports 3-4 episodes of feeling "swimmy headed" in the last month when removing oxygen to get up to the restroom at night. Quickly resolves if he stands still for a few seconds. Denies instability on feel, palpitations, accompanying CP or dyspnea. No dizziness elicited in office today.   BMI is Body mass index is 23.21 kg/m., he has been working on diet.  He reports his exercise is limited recently related to declining condition of his wife for whom he is the primary caregiver.  She is stable but continues to require extensive assistance.  Wt Readings from Last 3 Encounters:  12/31/19 131 lb (59.4 kg)  11/02/19 135 lb (61.2 kg)  09/23/19 133 lb 6.4 oz (60.5 kg)   His blood pressure has been controlled  at home, today their BP is BP: 124/68  He has known moderate TR and mild MR with LVEF 55-60% per ECHO 2017 He takes lasix 40 mg 4-5 days a week, excepting days that he will be leaving the home, has  done well with this for several years   He does not workout. He denies chest pain, dizziness, edema.    He is not on cholesterol medication. Pravastatin was recently discontinued due to advancing age and otherwise low risk history. His cholesterol is not at goal. The cholesterol last visit was:   Lab Results  Component Value Date   CHOL 219 (H) 06/03/2019   HDL 59 06/03/2019   LDLCALC 138 (H) 06/03/2019   TRIG 106 06/03/2019   CHOLHDL 3.7 06/03/2019    He has been working on diet and exercise for history of prediabetes, and denies increased appetite, nausea, paresthesia of the feet, polydipsia, polyuria, visual disturbances and vomiting. Last A1C in the office was:  Lab Results  Component Value Date   HGBA1C 5.7 (H) 09/23/2019   Patient is on Vitamin D supplement and at goal at recent check:    Lab Results  Component Value Date   VD25OH 78 09/23/2019        Current Medications:  Current Outpatient Medications on File Prior to Visit  Medication Sig  . albuterol (PROVENTIL) (2.5 MG/3ML) 0.083% nebulizer solution Take 3 mLs (2.5 mg total) by nebulization every 6 (six) hours as needed for wheezing or shortness of breath.  . Albuterol Sulfate (PROAIR RESPICLICK) 123XX123 (90 Base) MCG/ACT AEPB Inhale 2 Act into the lungs every 6 (six) hours as needed (for shortness of breath).  . Ascorbic Acid (VITAMIN C PO) Take 1 tablet by mouth daily. Takes 400 mg daily.  Marland Kitchen aspirin 81 MG tablet Take 81 mg by mouth daily.  . Cholecalciferol (VITAMIN D PO) Take 8,000 Int'l Units by mouth daily.   . furosemide (LASIX) 40 MG tablet TAKE 1 TABLET (40 MG TOTAL) BY MOUTH DAILY AS NEEDED FOR FLUID OR EDEMA.  . Multiple Vitamin (MULTI VITAMIN DAILY PO) Take 1 tablet by mouth daily.   . Omega-3 Fatty Acids (FISH OIL PO) Take 1 capsule by mouth daily.   . potassium chloride SA (K-DUR,KLOR-CON) 20 MEQ tablet TAKE 2 TABLETS BY MOUTH DAILY.  Marland Kitchen umeclidinium-vilanterol (ANORO ELLIPTA) 62.5-25 MCG/INH AEPB Inhale 1 puff  into the lungs daily. Make sure you rinse your mouth after each use.   No current facility-administered medications on file prior to visit.     Allergies:  Allergies  Allergen Reactions  . Lipitor [Atorvastatin] Other (See Comments)    Severe NIV  . Niaspan [Niacin Er] Itching     Medical History:  Past Medical History:  Diagnosis Date  . Acute respiratory failure with hypoxia and hypercapnia ([place]) 09/07/2015  . AKI (acute kidney injury) ([place]) 09/08/2015  . Cancer [facility])    prostate  . CAP (community acquired pneumonia) 09/07/2015  . Hyperlipidemia   . Hypertension   . Pre-diabetes   . Vitamin D deficiency    Family history- Reviewed and unchanged Social history- Reviewed and unchanged   Review of Systems:  Review of Systems  Constitutional: Negative for malaise/fatigue and weight loss.  HENT: Negative for congestion, hearing loss, sore throat and tinnitus.   Eyes: Negative for blurred vision and double vision.  Respiratory: Negative for cough, sputum production, shortness of breath (exertional, chronic) and wheezing.   Cardiovascular: Negative for chest pain, palpitations, orthopnea, claudication and leg swelling.  Gastrointestinal: Negative for abdominal pain, blood in stool, constipation, diarrhea, heartburn, melena, nausea and vomiting.  Genitourinary: Negative.   Musculoskeletal: Negative for joint pain and myalgias.  Skin: Negative for rash.  Neurological: Negative for dizziness, tingling, sensory change, weakness and headaches.  Endo/Heme/Allergies: Negative for polydipsia.  Psychiatric/Behavioral: Negative.   All other systems reviewed and are negative.   Physical Exam: BP 124/68   Pulse 60   Temp (!) 97.5 F (36.4 C)   Ht 5\' 3"  (1.6 m)   Wt 131 lb (59.4 kg)   SpO2 97%   BMI 23.21 kg/m  Wt Readings from Last 3 Encounters:  12/31/19 131 lb (59.4 kg)  11/02/19 135 lb (61.2 kg)  09/23/19 133 lb 6.4 oz (60.5 kg)   General Appearance: Well nourished, in no  apparent distress. Eyes: left pupil reactive to light, right pupil fixed - per patient this is chronic, vision intact without changes, EOMs, conjunctiva no swelling or erythema Sinuses: No Frontal/maxillary tenderness ENT/Mouth: Ext aud canals clear, TMs without erythema, bulging. No erythema, swelling, or exudate on post pharynx.  Tonsils not swollen or erythematous. Hearing normal.  Neck: Supple, thyroid normal.  Respiratory: Respiratory effort normal, BS present throughout without rales, rhonchi, wheezing or stridor.  Cardio: RRR with no MRGs. Brisk peripheral pulses without edema.  Abdomen: Soft, + BS.  Non tender, no guarding, rebound, masses. He has R inguinal hernia without tenderness, erythema, easily reduced.  Lymphatics: Non tender without lymphadenopathy.  Musculoskeletal: Severe kyphoscoliosis limiting upper/mid back and chest expansion, symmetrical extremity strength, slow gait Skin: Warm, dry without rashes, lesions, ecchymosis.  Neuro: Cranial nerves intact. No cerebellar symptoms.  Psych: Awake and oriented X 3, normal affect, Insight and Judgment appropriate.    Izora Ribas, NP 3:03 PM [facility]

## 2019-12-31 ENCOUNTER — Ambulatory Visit (INDEPENDENT_AMBULATORY_CARE_PROVIDER_SITE_OTHER): Payer: Medicare Other | Admitting: Adult Health

## 2019-12-31 ENCOUNTER — Encounter: Payer: Self-pay | Admitting: Adult Health

## 2019-12-31 ENCOUNTER — Other Ambulatory Visit: Payer: Self-pay

## 2019-12-31 VITALS — BP 124/68 | HR 60 | Temp 97.5°F | Ht 63.0 in | Wt 131.0 lb

## 2019-12-31 DIAGNOSIS — Z79899 Other long term (current) drug therapy: Secondary | ICD-10-CM | POA: Diagnosis not present

## 2019-12-31 DIAGNOSIS — I071 Rheumatic tricuspid insufficiency: Secondary | ICD-10-CM | POA: Diagnosis not present

## 2019-12-31 DIAGNOSIS — E559 Vitamin D deficiency, unspecified: Secondary | ICD-10-CM | POA: Diagnosis not present

## 2019-12-31 DIAGNOSIS — J984 Other disorders of lung: Secondary | ICD-10-CM

## 2019-12-31 DIAGNOSIS — E782 Mixed hyperlipidemia: Secondary | ICD-10-CM

## 2019-12-31 DIAGNOSIS — R7309 Other abnormal glucose: Secondary | ICD-10-CM

## 2019-12-31 DIAGNOSIS — I10 Essential (primary) hypertension: Secondary | ICD-10-CM

## 2019-12-31 DIAGNOSIS — M419 Scoliosis, unspecified: Secondary | ICD-10-CM

## 2019-12-31 NOTE — Patient Instructions (Addendum)
Goals    . Blood Pressure < 140/90    . Exercise 30 min daily     Get a stationary cycle or pedal or can do chair/wall strength exercises       Try lasix/furosemide every other day - monitor blood pressure, swelling in legs, if dizzy episodes are any better -message back in 2 weeks  Do anoro daily for 1 week, then stop 1 week - compare to see if any significant difference. If not, will discontinue and can use albuterol as needed only.    Please monitor your blood pressure, as we get older our body can not respond to a low blood pressure as well as it did when we were younger, for this reason we want a bit higher of a blood pressure as you get older to avoid dizziness and fatigue which can lead to falls. [place] call if your blood pressure is consistently above 150/90.   Hypotension As your heart beats, it forces blood through your body. This force is called blood pressure. If you have hypotension, you have low blood pressure. When your blood pressure is too low, you may not get enough blood to your brain. You may feel weak, feel lightheaded, have a fast heartbeat, or even pass out (faint). HOME CARE  Drink enough fluids to keep your pee (urine) clear or pale yellow.  Take all medicines as told by your doctor.  Get up slowly after sitting or lying down.  Wear support stockings as told by your doctor.  Maintain a healthy diet by including foods such as fruits, vegetables, nuts, whole grains, and lean meats. GET HELP IF:  You are throwing up (vomiting) or have watery poop (diarrhea).  You have a fever for more than 2-3 days.  You feel more thirsty than usual.  You feel weak and tired. GET HELP RIGHT AWAY IF:   You pass out (faint).  You have chest pain or a fast or irregular heartbeat.  You lose feeling in part of your body.  You cannot move your arms or legs.  You have trouble speaking.  You get sweaty or feel lightheaded. MAKE SURE YOU:   Understand these  instructions.  Will watch your condition.  Will get help right away if you are not doing well or get worse. Document Released: 11/14/2009 Document Revised: 04/22/2013 Document Reviewed: 02/20/2013 [facility] Patient Information 2015 [place], [place]. This information is not intended to replace advice given to you by your health care provider. Make sure you discuss any questions you have with your health care provider.

## 2020-01-01 LAB — CBC WITH DIFFERENTIAL/PLATELET
Absolute Monocytes: 487 cells/uL (ref 200–950)
Basophils Absolute: 12 cells/uL (ref 0–200)
Basophils Relative: 0.2 %
Eosinophils Absolute: 17 cells/uL (ref 15–500)
Eosinophils Relative: 0.3 %
HCT: 43.2 % (ref 38.5–50.0)
Hemoglobin: 14.3 g/dL (ref 13.2–17.1)
Lymphs Abs: 1438 cells/uL (ref 850–3900)
MCH: 32.2 pg (ref 27.0–33.0)
MCHC: 33.1 g/dL (ref 32.0–36.0)
MCV: 97.3 fL (ref 80.0–100.0)
MPV: 11 fL (ref 7.5–12.5)
Monocytes Relative: 8.4 %
Neutro Abs: 3845 cells/uL (ref 1500–7800)
Neutrophils Relative %: 66.3 %
Platelets: 136 10*3/uL — ABNORMAL LOW (ref 140–400)
RBC: 4.44 10*6/uL (ref 4.20–5.80)
RDW: 12.1 % (ref 11.0–15.0)
Total Lymphocyte: 24.8 %
WBC: 5.8 10*3/uL (ref 3.8–10.8)

## 2020-01-01 LAB — COMPLETE METABOLIC PANEL WITH GFR
AG Ratio: 2.1 (calc) (ref 1.0–2.5)
ALT: 17 U/L (ref 9–46)
AST: 20 U/L (ref 10–35)
Albumin: 4.2 g/dL (ref 3.6–5.1)
Alkaline phosphatase (APISO): 53 U/L (ref 35–144)
BUN/Creatinine Ratio: 37 (calc) — ABNORMAL HIGH (ref 6–22)
BUN: 31 mg/dL — ABNORMAL HIGH (ref 7–25)
CO2: 36 mmol/L — ABNORMAL HIGH (ref 20–32)
Calcium: 9.6 mg/dL (ref 8.6–10.3)
Chloride: 102 mmol/L (ref 98–110)
Creat: 0.84 mg/dL (ref 0.70–1.11)
GFR, Est African American: 91 mL/min/{1.73_m2} (ref >=60)
GFR, Est Non African American: 78 mL/min/{1.73_m2} (ref >=60)
Globulin: 2 g/dL (calc) (ref 1.9–3.7)
Glucose, Bld: 161 mg/dL — ABNORMAL HIGH (ref 65–99)
Potassium: 5.2 mmol/L (ref 3.5–5.3)
Sodium: 144 mmol/L (ref 135–146)
Total Bilirubin: 0.6 mg/dL (ref 0.2–1.2)
Total Protein: 6.2 g/dL (ref 6.1–8.1)

## 2020-01-01 LAB — MAGNESIUM: Magnesium: 1.9 mg/dL (ref 1.5–2.5)

## 2020-01-01 LAB — TSH: TSH: 0.52 mIU/L (ref 0.40–4.50)

## 2020-03-28 DIAGNOSIS — L570 Actinic keratosis: Secondary | ICD-10-CM | POA: Diagnosis not present

## 2020-03-28 DIAGNOSIS — D225 Melanocytic nevi of trunk: Secondary | ICD-10-CM | POA: Diagnosis not present

## 2020-03-28 DIAGNOSIS — D044 Carcinoma in situ of skin of scalp and neck: Secondary | ICD-10-CM | POA: Diagnosis not present

## 2020-03-28 DIAGNOSIS — D1801 Hemangioma of skin and subcutaneous tissue: Secondary | ICD-10-CM | POA: Diagnosis not present

## 2020-03-28 DIAGNOSIS — D692 Other nonthrombocytopenic purpura: Secondary | ICD-10-CM | POA: Diagnosis not present

## 2020-03-28 DIAGNOSIS — L821 Other seborrheic keratosis: Secondary | ICD-10-CM | POA: Diagnosis not present

## 2020-03-28 DIAGNOSIS — Z85828 Personal history of other malignant neoplasm of skin: Secondary | ICD-10-CM | POA: Diagnosis not present

## 2020-03-28 DIAGNOSIS — L814 Other melanin hyperpigmentation: Secondary | ICD-10-CM | POA: Diagnosis not present

## 2020-03-30 ENCOUNTER — Encounter: Payer: Self-pay | Admitting: Internal Medicine

## 2020-03-30 NOTE — Patient Instructions (Signed)

## 2020-03-30 NOTE — Progress Notes (Signed)
History of Present Illness:       This very nice 86 y.o.  MWM presents for f/u ofHTN, HLD, T2_NIDDM  Prediabetes, Vitamin D Deficiency  and  severe Restrictive Lung Disease consequent of severe Kyphorotoscoliosis      Patient is treated for HTN (2004)  & BP has been controlled at home. Today's BP is at goal - 120/58. Patient has had no complaints of any cardiac type chest pain, palpitations, dyspnea / orthopnea / PND, dizziness, claudication, or dependent edema.      Hyperlipidemia is controlled with diet and not aggressively treated for age considerations. Last Lipids were not at goal:   Lab Results  Component Value Date   CHOL 219 (H) 06/03/2019   HDL 59 06/03/2019   LDLCALC 138 (H) 06/03/2019   TRIG 106 06/03/2019   CHOLHDL 3.7 06/03/2019    Also, the patient has history of PreDiabetes (A1c 5.8% / 2012)  and then T2_NIDDM(A1c 6.7%/Jan 2017),  but with weight loss, his A1c's  Normalized.  and has had no symptoms of reactive hypoglycemia, diabetic polys, paresthesias or visual blurring.  Last A1c was near goal:  Lab Results  Component Value Date   HGBA1C 5.7 (H) 09/23/2019       Further, the patient also has history of Vitamin D Deficiency (35"/2012) and supplements vitamin D without any suspected side-effects. Last vitamin D was at goal:  Lab Results  Component Value Date   VD25OH 78 09/23/2019    Current Outpatient Medications on File Prior to Visit  Medication Sig  . albuterol (PROVENTIL) (2.5 MG/3ML) 0.083% nebulizer solution Take 3 mLs (2.5 mg total) by nebulization every 6 (six) hours as needed for wheezing or shortness of breath.  . Albuterol Sulfate (PROAIR RESPICLICK) 237 (90 Base) MCG/ACT AEPB Inhale 2 Act into the lungs every 6 (six) hours as needed (for shortness of breath).  . Ascorbic Acid (VITAMIN C PO) Take 1 tablet by mouth daily. Takes 400 mg daily.  Marland Kitchen aspirin 81 MG tablet Take 81 mg by mouth daily.  . Cholecalciferol (VITAMIN D PO) Take 8,000  Int'l Units by mouth daily.   . furosemide (LASIX) 40 MG tablet TAKE 1 TABLET (40 MG TOTAL) BY MOUTH DAILY AS NEEDED FOR FLUID OR EDEMA.  . Multiple Vitamin (MULTI VITAMIN DAILY PO) Take 1 tablet by mouth daily.   . Omega-3 Fatty Acids (FISH OIL PO) Take 1 capsule by mouth daily.   . potassium chloride SA (K-DUR,KLOR-CON) 20 MEQ tablet TAKE 2 TABLETS BY MOUTH DAILY.  Marland Kitchen umeclidinium-vilanterol (ANORO ELLIPTA) 62.5-25 MCG/INH AEPB Inhale 1 puff into the lungs daily. Make sure you rinse your mouth after each use.   No current facility-administered medications on file prior to visit.    Allergies  Allergen Reactions  . Lipitor [Atorvastatin] Other (See Comments)    Severe NIV  . Niaspan [Niacin Er] Itching    PMHx:   Past Medical History:  Diagnosis Date  . Acute respiratory failure with hypoxia and hypercapnia ([place]) 09/07/2015  . AKI (acute kidney injury) ([place]) 09/08/2015  . Cancer [facility])    prostate  . CAP (community acquired pneumonia) 09/07/2015  . Hyperlipidemia   . Hypertension   . Pre-diabetes   . Vitamin D deficiency     Immunization History  Administered Date(s) Administered  . DTaP 09/03/2005  . Influenza Whole 05/20/2013  . Influenza, High Dose Seasonal PF 06/04/2014, 06/09/2015, 05/03/2016, 05/28/2017, 05/30/2018, 06/03/2019  . Moderna SARS-COVID-2 Vaccination 09/20/2019, 10/10/2019  .  Pneumococcal Conjugate-13 10/21/2015  . Pneumococcal Polysaccharide-23 02/05/2012  . Td 03/04/2017  . Zoster 09/25/2005    Past Surgical History:  Procedure Laterality Date  . PROSTATECTOMY  2001    FHx:    Reviewed / unchanged  SHx:    Reviewed / unchanged   Systems Review:  Constitutional: Denies fever, chills, wt changes, headaches, insomnia, fatigue, night sweats, change in appetite. Eyes: Denies redness, blurred vision, diplopia, discharge, itchy, watery eyes.  ENT: Denies discharge, congestion, post nasal drip, epistaxis, sore throat, earache, hearing loss, dental pain,  tinnitus, vertigo, sinus pain, snoring.  CV: Denies chest pain, palpitations, irregular heartbeat, syncope, dyspnea, diaphoresis, orthopnea, PND, claudication or edema. Respiratory: denies cough, dyspnea, DOE, pleurisy, hoarseness, laryngitis, wheezing.  Gastrointestinal: Denies dysphagia, odynophagia, heartburn, reflux, water brash, abdominal pain or cramps, nausea, vomiting, bloating, diarrhea, constipation, hematemesis, melena, hematochezia  or hemorrhoids. Genitourinary: Denies dysuria, frequency, urgency, nocturia, hesitancy, discharge, hematuria or flank pain. Musculoskeletal: Denies arthralgias, myalgias, stiffness, jt. swelling, pain, limping or strain/sprain.  Skin: Denies pruritus, rash, hives, warts, acne, eczema or change in skin lesion(s). Neuro: No weakness, tremor, incoordination, spasms, paresthesia or pain. Psychiatric: Denies confusion, memory loss or sensory loss. Endo: Denies change in weight, skin or hair change.  Heme/Lymph: No excessive bleeding, bruising or enlarged lymph nodes.  Physical Exam  BP (!) 120/58   Pulse 54   Temp (!) 97.2 F (36.2 C)   Resp 18   Ht 5\' 3"  (1.6 m)   Wt 131 lb 6.4 oz (59.6 kg)   SpO2 97%   BMI 23.28 kg/m   Appears  well nourished, well groomed  and in no distress.  Eyes: PERRLA, EOMs, conjunctiva no swelling or erythema. Sinuses: No frontal/maxillary tenderness ENT/Mouth: EAC's clear, TM's nl w/o erythema, bulging. Nares clear w/o erythema, swelling, exudates. Oropharynx clear without erythema or exudates. Oral hygiene is good. Tongue normal, non obstructing. Hearing intact.  Neck: Supple. Thyroid not palpable. Car 2+/2+ without bruits, nodes or JVD. Chest: Severe thoracolumbar kyphohrotoscoliosisandmoderate severe chest deformity. Respirations nl with BS clear & equal w/o rales, rhonchi, wheezing or stridor.  Cor: Heart sounds normal w/ regular rate and rhythm without sig. murmurs, gallops, clicks or rubs. Peripheral pulses normal  and equal  without edema.  Abdomen: Soft & bowel sounds normal. Non-tender w/o guarding, rebound, hernias, masses or organomegaly.  Lymphatics: Unremarkable.  Musculoskeletal: Full ROM all peripheral extremities, joint stability, 5/5 strength and normal gait.  Skin: Warm, dry without exposed rashes, lesions or ecchymosis apparent.  Neuro: Cranial nerves intact, reflexes equal bilaterally. Sensory-motor testing grossly intact. Tendon reflexes grossly intact.  Pysch: Alert & oriented x 3.  Insight and judgement nl & appropriate. No ideations.  Assessment and Plan:  1. Essential hypertension  - Continue medication, monitor blood pressure at home.  - Continue DASH diet.  Reminder to go to the ER if any CP,  SOB, nausea, dizziness, severe HA, changes vision/speech.  - CBC with Differential/Platelet - COMPLETE METABOLIC PANEL WITH GFR - Magnesium  2. Hyperlipidemia, mixed  - Continue diet/meds, exercise,& lifestyle modifications.  - Continue monitor periodic cholesterol/liver & renal functions    - TSH  3. Abnormal glucose  - Hemoglobin A1c - Insulin, random  4. Vitamin D deficiency  - Continue diet, exercise  - Lifestyle modifications.  - Monitor appropriate labs. - Continue supplementation.  - VITAMIN D 25 Hydroxy  5. Medication management  - CBC with Differential/Platelet - COMPLETE METABOLIC PANEL WITH GFR - Magnesium - TSH - Hemoglobin A1c -  Insulin, random - VITAMIN D 25 Hydroxy         Discussed  regular exercise, BP monitoring, weight control to achieve/maintain BMI less than 25 and discussed med and SE's. Recommended labs to assess and monitor clinical status with further disposition pending results of labs.  I discussed the assessment and treatment plan with the patient. The patient was provided an opportunity to ask questions and all were answered. The patient agreed with the plan and demonstrated an understanding of the instructions.  I provided over 30  minutes of exam, counseling, chart review and  complex critical decision making.         The patient was advised to call back or seek an in-person evaluation if the symptoms worsen or if the condition fails to improve as anticipated.   Kirtland Bouchard, MD

## 2020-03-31 ENCOUNTER — Other Ambulatory Visit: Payer: Self-pay

## 2020-03-31 ENCOUNTER — Ambulatory Visit (INDEPENDENT_AMBULATORY_CARE_PROVIDER_SITE_OTHER): Payer: Medicare Other | Admitting: Internal Medicine

## 2020-03-31 VITALS — BP 120/58 | HR 54 | Temp 97.2°F | Resp 18 | Ht 63.0 in | Wt 131.4 lb

## 2020-03-31 DIAGNOSIS — I10 Essential (primary) hypertension: Secondary | ICD-10-CM | POA: Diagnosis not present

## 2020-03-31 DIAGNOSIS — Z79899 Other long term (current) drug therapy: Secondary | ICD-10-CM

## 2020-03-31 DIAGNOSIS — R7309 Other abnormal glucose: Secondary | ICD-10-CM | POA: Diagnosis not present

## 2020-03-31 DIAGNOSIS — E559 Vitamin D deficiency, unspecified: Secondary | ICD-10-CM

## 2020-03-31 DIAGNOSIS — E782 Mixed hyperlipidemia: Secondary | ICD-10-CM

## 2020-04-01 LAB — TSH: TSH: 0.53 mIU/L (ref 0.40–4.50)

## 2020-04-01 LAB — COMPLETE METABOLIC PANEL WITH GFR
AG Ratio: 1.8 (calc) (ref 1.0–2.5)
ALT: 15 U/L (ref 9–46)
AST: 22 U/L (ref 10–35)
Albumin: 4 g/dL (ref 3.6–5.1)
Alkaline phosphatase (APISO): 53 U/L (ref 35–144)
BUN/Creatinine Ratio: 36 (calc) — ABNORMAL HIGH (ref 6–22)
BUN: 34 mg/dL — ABNORMAL HIGH (ref 7–25)
CO2: 36 mmol/L — ABNORMAL HIGH (ref 20–32)
Calcium: 9 mg/dL (ref 8.6–10.3)
Chloride: 101 mmol/L (ref 98–110)
Creat: 0.95 mg/dL (ref 0.70–1.11)
GFR, Est African American: 82 mL/min/{1.73_m2} (ref >=60)
GFR, Est Non African American: 71 mL/min/{1.73_m2} (ref >=60)
Globulin: 2.2 g/dL (calc) (ref 1.9–3.7)
Glucose, Bld: 187 mg/dL — ABNORMAL HIGH (ref 65–99)
Potassium: 4.5 mmol/L (ref 3.5–5.3)
Sodium: 143 mmol/L (ref 135–146)
Total Bilirubin: 0.6 mg/dL (ref 0.2–1.2)
Total Protein: 6.2 g/dL (ref 6.1–8.1)

## 2020-04-01 LAB — CBC WITH DIFFERENTIAL/PLATELET
Absolute Monocytes: 465 cells/uL (ref 200–950)
Basophils Absolute: 10 cells/uL (ref 0–200)
Basophils Relative: 0.2 %
Eosinophils Absolute: 20 cells/uL (ref 15–500)
Eosinophils Relative: 0.4 %
HCT: 41.8 % (ref 38.5–50.0)
Hemoglobin: 13.9 g/dL (ref 13.2–17.1)
Lymphs Abs: 1350 cells/uL (ref 850–3900)
MCH: 32.4 pg (ref 27.0–33.0)
MCHC: 33.3 g/dL (ref 32.0–36.0)
MCV: 97.4 fL (ref 80.0–100.0)
MPV: 10.8 fL (ref 7.5–12.5)
Monocytes Relative: 9.3 %
Neutro Abs: 3155 cells/uL (ref 1500–7800)
Neutrophils Relative %: 63.1 %
Platelets: 140 10*3/uL (ref 140–400)
RBC: 4.29 10*6/uL (ref 4.20–5.80)
RDW: 12.2 % (ref 11.0–15.0)
Total Lymphocyte: 27 %
WBC: 5 10*3/uL (ref 3.8–10.8)

## 2020-04-01 LAB — INSULIN, RANDOM: Insulin: 38.8 u[IU]/mL — ABNORMAL HIGH

## 2020-04-01 LAB — HEMOGLOBIN A1C
Hgb A1c MFr Bld: 5.5 % of total Hgb (ref <5.7)
Mean Plasma Glucose: 111 (calc)
eAG (mmol/L): 6.2 (calc)

## 2020-04-01 LAB — MAGNESIUM: Magnesium: 2.1 mg/dL (ref 1.5–2.5)

## 2020-04-01 LAB — VITAMIN D 25 HYDROXY (VIT D DEFICIENCY, FRACTURES): Vit D, 25-Hydroxy: 80 ng/mL (ref 30–100)

## 2020-04-02 NOTE — Progress Notes (Signed)
=====================================================  -    A1c - Normal - Great - No Diabetes =====================================================  -  Vitamin D = 80 - Excellent  =====================================================  -  All Else - CBC - Kidneys - Electrolytes - Liver - Magnesium  & Thyroid    - all  Normal / OK ====================================================

## 2020-07-01 DIAGNOSIS — L821 Other seborrheic keratosis: Secondary | ICD-10-CM | POA: Diagnosis not present

## 2020-07-01 DIAGNOSIS — Z85828 Personal history of other malignant neoplasm of skin: Secondary | ICD-10-CM | POA: Diagnosis not present

## 2020-07-01 DIAGNOSIS — L814 Other melanin hyperpigmentation: Secondary | ICD-10-CM | POA: Diagnosis not present

## 2020-07-01 DIAGNOSIS — D1801 Hemangioma of skin and subcutaneous tissue: Secondary | ICD-10-CM | POA: Diagnosis not present

## 2020-07-01 DIAGNOSIS — L570 Actinic keratosis: Secondary | ICD-10-CM | POA: Diagnosis not present

## 2020-07-01 DIAGNOSIS — D225 Melanocytic nevi of trunk: Secondary | ICD-10-CM | POA: Diagnosis not present

## 2020-07-06 ENCOUNTER — Other Ambulatory Visit: Payer: Self-pay

## 2020-07-06 ENCOUNTER — Ambulatory Visit: Payer: Medicare Other | Admitting: Adult Health Nurse Practitioner

## 2020-07-06 ENCOUNTER — Encounter: Payer: Self-pay | Admitting: Adult Health Nurse Practitioner

## 2020-07-06 ENCOUNTER — Ambulatory Visit (INDEPENDENT_AMBULATORY_CARE_PROVIDER_SITE_OTHER): Payer: Medicare Other | Admitting: Adult Health Nurse Practitioner

## 2020-07-06 VITALS — BP 136/74 | HR 71 | Temp 97.6°F | Ht 63.0 in | Wt 134.0 lb

## 2020-07-06 DIAGNOSIS — E782 Mixed hyperlipidemia: Secondary | ICD-10-CM

## 2020-07-06 DIAGNOSIS — E559 Vitamin D deficiency, unspecified: Secondary | ICD-10-CM | POA: Diagnosis not present

## 2020-07-06 DIAGNOSIS — M419 Scoliosis, unspecified: Secondary | ICD-10-CM

## 2020-07-06 DIAGNOSIS — Z87898 Personal history of other specified conditions: Secondary | ICD-10-CM | POA: Diagnosis not present

## 2020-07-06 DIAGNOSIS — N401 Enlarged prostate with lower urinary tract symptoms: Secondary | ICD-10-CM

## 2020-07-06 DIAGNOSIS — Z0001 Encounter for general adult medical examination with abnormal findings: Secondary | ICD-10-CM | POA: Diagnosis not present

## 2020-07-06 DIAGNOSIS — R6889 Other general symptoms and signs: Secondary | ICD-10-CM

## 2020-07-06 DIAGNOSIS — Z6823 Body mass index (BMI) 23.0-23.9, adult: Secondary | ICD-10-CM

## 2020-07-06 DIAGNOSIS — Z0000 Encounter for general adult medical examination without abnormal findings: Secondary | ICD-10-CM

## 2020-07-06 DIAGNOSIS — R7309 Other abnormal glucose: Secondary | ICD-10-CM

## 2020-07-06 DIAGNOSIS — Z23 Encounter for immunization: Secondary | ICD-10-CM | POA: Diagnosis not present

## 2020-07-06 DIAGNOSIS — I10 Essential (primary) hypertension: Secondary | ICD-10-CM

## 2020-07-06 DIAGNOSIS — J984 Other disorders of lung: Secondary | ICD-10-CM

## 2020-07-06 DIAGNOSIS — N138 Other obstructive and reflux uropathy: Secondary | ICD-10-CM | POA: Diagnosis not present

## 2020-07-06 DIAGNOSIS — R600 Localized edema: Secondary | ICD-10-CM | POA: Diagnosis not present

## 2020-07-06 NOTE — Progress Notes (Signed)
Patient ID: Timothy Vaughn, male   DOB: Mar 29, 1929, 86 y.o.   MRN: [id number]  [place] VISIT AND 3 MONTH OV  Assessment:   Byran was seen today for follow-up and medicare wellness.  Diagnoses and all orders for this visit:  Encounter for Medicare annual wellness exam Yearly   Essential hypertension Continue current medications: controlled today Monitor blood pressure at home; call if consistently over 130/80 Continue DASH diet.   Reminder to go to the ER if any CP, SOB, nausea, dizziness, severe HA, changes vision/speech, left arm numbness and tingling and jaw pain. -     CBC with Differential/Platelet -     COMPLETE METABOLIC PANEL WITH GFR  Hyperlipidemia, mixed Controlled with lifestyle modifications Discussed dietary and exercise modifications Low fat diet  History of prediabetes Abnormal glucose Discussed dietary and exercise modifications  Vitamin D deficiency Continue supplementation to maintain goal of 70-100 Taking Vitamin D 8,000 IU daily Defer vitamin D level   BPH with obstruction/lower urinary tract symptoms Doing well at this time Continue medications:  Will continue to monitor Defer PSA this check  Lower Leg edema, Bilateral Taking Furosemide 20mg  daily, benefit with lower extremities Discussed compression stocking with patient, wear daily Increase water intake   Needs flu shot -     Flu vaccine HIGH DOSE PF Received today 07/05/20  Kyphoscoliosis Monitor  Restrictive lung disease due to kyphoscoliosis Using Anoro daily, discussed way to improve compliance, next to tooth brush Reminder to rinse mouth after use Use Neb PRN for shortness of breath.   BMI 23.0-23.9, adult Discussed dietary and exercise modifications    Future Appointments  Date Time Provider [place]  10/11/2020  3:00 PM Unk Pinto, MD [facility] None  07/06/2021  2:30 PM Garnet Sierras, NP [facility] None     Plan:   During the  course of the visit the patient was educated and counseled about appropriate screening and preventive services including:    Pneumococcal vaccine   Influenza vaccine  Td vaccine  Screening electrocardiogram  Bone densitometry screening  Colorectal cancer screening  Diabetes screening  Glaucoma screening  Nutrition counseling   Advanced directives: requested   Subjective:  Timothy Vaughn is a 86 y.o. male who presents for Medicare Annual Wellness Visit and 6 month OV follow up.    He reports that he gets a little swimmy headed at times, typically when he bends over. He reports this is typically in the morning, prior to taking medication.  He is not sure if he is changing positions slowly though he notes he tried to "be careful" if he is bending over.   He has severe kyphoscoliosis with consequent restrictive lung disease, followed by pulmonology, in on anoro with perceived benefit.  Reports he does not always remember to take this as he is a caregiver for his wife and he tends to put her needs first.  has had normal echo with moderate TR.   He reports his wife's health is declining and cannot be left alone, used to golf, etc but hasn't been able to.  He does not do any physical activity out side of the home.  He does do yard work and [employer] around his home.   BMI is Body mass index is 23.74 kg/m., he has not been working on diet and exercise. Wt Readings from Last 3 Encounters:  07/06/20 134 lb (60.8 kg)  03/31/20 131 lb 6.4 oz (59.6 kg)  12/31/19 131 lb (59.4 kg)  He has had elevated blood pressure since 2004. His blood pressure has been controlled at home, today their BP is BP: 136/74 He does workout. He denies chest pain, dizziness.   He is on cholesterol medication ( formerly on pravastatin 40 mg daily, recently d/c'd r/t age, continues with omega 3 supplement). His cholesterol is at goal. The cholesterol last visit was:   Lab Results  Component  Value Date   CHOL 219 (H) 06/03/2019   HDL 59 06/03/2019   LDLCALC 138 (H) 06/03/2019   TRIG 106 06/03/2019   CHOLHDL 3.7 06/03/2019   He has had history of prediabetes with an A1C in the DM range one time Jan 2017  He has been working on diet for preDM, he is on bASA, he is not on ACE/ARB due to hypotension and denies foot ulcerations, hyperglycemia, hypoglycemia , increased appetite, nausea, paresthesia of the feet, polydipsia, polyuria and visual disturbances. Last A1C in the office was:  Lab Results  Component Value Date   HGBA1C 5.5 03/31/2020   Lab Results  Component Value Date   GFRNONAA 71 03/31/2020   Patient is on Vitamin D supplement.   Lab Results  Component Value Date   VD25OH 80 03/31/2020         Medication Review: Current Outpatient Medications on File Prior to Visit  Medication Sig  . albuterol (PROVENTIL) (2.5 MG/3ML) 0.083% nebulizer solution Take 3 mLs (2.5 mg total) by nebulization every 6 (six) hours as needed for wheezing or shortness of breath.  . Albuterol Sulfate (PROAIR RESPICLICK) 962 (90 Base) MCG/ACT AEPB Inhale 2 Act into the lungs every 6 (six) hours as needed (for shortness of breath).  . Ascorbic Acid (VITAMIN C PO) Take 1 tablet by mouth daily. Takes 400 mg daily.  Marland Kitchen aspirin 81 MG tablet Take 81 mg by mouth daily.  . Cholecalciferol (VITAMIN D PO) Take 8,000 Int'l Units by mouth daily.   . furosemide (LASIX) 40 MG tablet TAKE 1 TABLET (40 MG TOTAL) BY MOUTH DAILY AS NEEDED FOR FLUID OR EDEMA.  . Multiple Vitamin (MULTI VITAMIN DAILY PO) Take 1 tablet by mouth daily.   . Omega-3 Fatty Acids (FISH OIL PO) Take 1 capsule by mouth daily.   . potassium chloride SA (K-DUR,KLOR-CON) 20 MEQ tablet TAKE 2 TABLETS BY MOUTH DAILY.  Marland Kitchen umeclidinium-vilanterol (ANORO ELLIPTA) 62.5-25 MCG/INH AEPB Inhale 1 puff into the lungs daily. Make sure you rinse your mouth after each use.   No current facility-administered medications on file prior to visit.     Current Problems (verified) Patient Active Problem List   Diagnosis Date Noted  . Moderate tricuspid regurgitation 09/08/2015  . Nocturnal hypoxia 09/06/2015  . Encounter for Medicare annual wellness exam 04/12/2015  . Medication management 02/10/2014  . Essential hypertension 08/14/2013  . Hyperlipidemia 08/14/2013  . Other abnormal glucose 08/14/2013  . Vitamin D deficiency 08/14/2013  . Kyphoscoliosis 08/14/2013  . Restrictive lung disease due to kyphoscoliosis 08/14/2013   Screening Tests Immunization History  Administered Date(s) Administered  . DTaP 09/03/2005  . Influenza Whole 05/20/2013  . Influenza, High Dose Seasonal PF 06/04/2014, 06/09/2015, 05/03/2016, 05/28/2017, 05/30/2018, 06/03/2019, 07/06/2020  . Moderna SARS-COVID-2 Vaccination 09/20/2019, 10/10/2019  . Pneumococcal Conjugate-13 10/21/2015  . Pneumococcal Polysaccharide-23 02/05/2012  . Td 03/04/2017  . Zoster 09/25/2005   Preventative care: Last colonoscopy: 2007 declines due to age Echo 2017 PFTs 09/2015 CT AB 08/2010  Prior vaccinations: Tdap: 2007, 2018  Influenza: 07/2020 received today Pneumococcal: 02/2012 prevnar 13: 2017 Shingles/Zostavax:  09/2005 Discussed SARS-COV2- Booster: Will contact local pharmacy   Names of Other Physician/Practitioners you currently use: 1. [facility] here for primary care 2. Dr. Augusto Gamble, eye doctor, last visit 06/2018 - reports cataract, has glasses, has follow up scheduled for 2021 3. Dr. Karma Lew, dentist, last visit due for 2021, office rescheduled due to [place] in the office, scheduled for FEB 2022.  Patient Care Team: Unk Pinto, MD as PCP - General (Internal Medicine) Inda Castle, MD (Inactive) as Consulting Physician (Gastroenterology) Carolan Clines, MD (Inactive) as Consulting Physician (Urology)   History reviewed: allergies, current medications, past family history, past medical history, past  social history, past surgical history and problem list  Allergies Allergies  Allergen Reactions  . Lipitor [Atorvastatin] Other (See Comments)    Severe NIV  . Niaspan [Niacin Er] Itching    SURGICAL HISTORY He  has a past surgical history that includes Prostatectomy (2001). FAMILY HISTORY His family history includes Alzheimer's disease in his father; COPD in his mother; Cancer in his brother and father; Diabetes in his maternal grandfather; Heart disease in his father and mother; Stroke in his maternal grandfather. SOCIAL HISTORY He  reports that he has never smoked. He has never used smokeless tobacco. He reports that he does not drink alcohol and does not use drugs.  MEDICARE WELLNESS OBJECTIVES: Physical activity: Current Exercise Habits: The patient does not participate in regular exercise at present (Caregiver for wife), Exercise limited by: orthopedic condition(s) Cardiac risk factors: Cardiac Risk Factors include: advanced age (>50men, >60 women);hypertension;sedentary lifestyle Depression/mood screen:   Depression screen [facility] 2/9 07/06/2020  Decreased Interest 0  Down, Depressed, Hopeless 0  PHQ - 2 Score 0    ADLs:  In your present state of health, do you have any difficulty performing the following activities: 07/06/2020 03/30/2020  Hearing? Y N  Comment Had evaluation at [facility], now has hearing aid -  Vision? Y N  Comment Overdue for eye exam -  Difficulty concentrating or making decisions? N N  Walking or climbing stairs? N N  Dressing or bathing? N N  Doing errands, shopping? N N  Preparing Food and eating ? N -  Using the Toilet? N -  In the past six months, have you accidently leaked urine? N -  Do you have problems with loss of bowel control? N -  Managing your Medications? N -  Managing your Finances? N -  Housekeeping or managing your Housekeeping? N -  Some recent data might be hidden     Cognitive Testing  Alert? Yes  Normal Appearance?Yes  Oriented  to person? Yes  Place? Yes   Time? Yes  Recall of three objects?  Yes  Can perform simple calculations? Yes  Displays appropriate judgment?Yes  Can read the correct time from a watch face?Yes  EOL planning: Does Patient Have a Medical Advance Directive?: Yes Type of Advance Directive: Healthcare Power of Attorney, Living will [place] in Chart?: No - copy requested   Objective:     Blood pressure 136/74, pulse 71, temperature 97.6 F (36.4 C), height 5\' 3"  (1.6 m), weight 134 lb (60.8 kg), SpO2 97 %. Body mass index is 23.74 kg/m.  General appearance: alert, no distress, WD/WN, male Eyes: PERRLA, EOMs, conjunctiva no swelling or erythema Sinuses: No Frontal/maxillary tenderness ENT/Mouth: Ext aud canals clear, TMs without erythema, bulging. No erythema, swelling, or exudate on post pharynx.  Tonsils not swollen or erythematous. Hearing normal.  Neck: Supple, thyroid normal.  Respiratory: Respiratory effort normal, distant lung sounds due to kyphoscoliosis but no wheezing, rales, rhonchi. Cardio: RRR, bigeminy/extra beats, with no MRGs. Intact peripheral pulses, 1+ pitting pretibial edema bilaterally .  Abdomen: Soft, + BS,  Non tender, no guarding, rebound, hernias, masses. Lymphatics: Non tender without lymphadenopathy.  Musculoskeletal: Severe kyphoscoliosis, 4/5 strength, Normal gait Skin: Warm, dry without rashes, he has scattered ecchymosis bil upper extremities; he has R hand dorsal lesion, erythematous base  1 cm with hyperkeratotic lesion approx 3 mm Neuro: Cranial nerves intact. Normal muscle tone, no cerebellar symptoms. Psych: Awake and oriented X 3, normal affect, Insight and Judgment appropriate.  Medicare Attestation I have personally reviewed: The patient's medical and social history Their use of alcohol, tobacco or illicit drugs Their current medications and supplements The patient's functional ability including ADLs,fall risks, home  safety risks, cognitive, and hearing and visual impairment Diet and physical activities Evidence for depression or mood disorders  The patient's weight, height, BMI, and visual acuity have been recorded in the chart.  I have made referrals, counseling, and provided education to the patient based on review of the above and I have provided the patient with a written personalized care plan for preventive services.    Garnet Sierras, NP   07/06/2020

## 2020-07-06 NOTE — Patient Instructions (Addendum)
Consider wearing compression stockings.  Put them on first thing in the morning and remove at bedtime.  This will help reduce the swelling in your legs.  You can see if [facility] has any, you want a medium compression.   You can also get some from:    [employer]  has a wide variety of well priced compression stockings. [place], [place] [address] [address] [employer] has a good cheap selection, I like the socks, they are not as hard to get on  Walk as much as possible to increase blood flow.  Raise the foot of your bed at night with 2-inch blocks.   Increase water intake, drink a full glass of water first thing in the morning.  Be sure to use the Anoro inhaler daily in morning as well.  Place this by tooth brush if you are having difficulty remembering to use this. It is long acting medication to help improve your breathing.  Use the nebulizer when you are short of breath, this is a short acting medication to give you more instant relief.   Check with you local pharmacy for the COVID-19 booster.   Mr. Timothy Vaughn , Thank you for taking time to come for your Medicare Wellness Visit. I appreciate your ongoing commitment to your health goals. Please review the following plan we discussed and let me know if I can assist you in the future.   These are the goals we discussed: Goals    . Blood Pressure < 140/90    . Exercise 30 min daily     Get a stationary cycle or pedal or can do chair/wall strength exercises       This is a list of the screening recommended for you and due dates:  Health Maintenance  Topic Date Due  . Flu Shot  04/03/2020  . Tetanus Vaccine  03/05/2027  . COVID-19 Vaccine  Completed  . Pneumonia vaccines  Completed

## 2020-07-07 DIAGNOSIS — Z23 Encounter for immunization: Secondary | ICD-10-CM | POA: Diagnosis not present

## 2020-07-07 LAB — CBC WITH DIFFERENTIAL/PLATELET
Absolute Monocytes: 507 cells/uL (ref 200–950)
Basophils Absolute: 18 cells/uL (ref 0–200)
Basophils Relative: 0.3 %
Eosinophils Absolute: 41 cells/uL (ref 15–500)
Eosinophils Relative: 0.7 %
HCT: 42.4 % (ref 38.5–50.0)
Hemoglobin: 14.6 g/dL (ref 13.2–17.1)
Lymphs Abs: 1274 cells/uL (ref 850–3900)
MCH: 33.7 pg — ABNORMAL HIGH (ref 27.0–33.0)
MCHC: 34.4 g/dL (ref 32.0–36.0)
MCV: 97.9 fL (ref 80.0–100.0)
MPV: 10.8 fL (ref 7.5–12.5)
Monocytes Relative: 8.6 %
Neutro Abs: 4059 cells/uL (ref 1500–7800)
Neutrophils Relative %: 68.8 %
Platelets: 143 10*3/uL (ref 140–400)
RBC: 4.33 10*6/uL (ref 4.20–5.80)
RDW: 11.9 % (ref 11.0–15.0)
Total Lymphocyte: 21.6 %
WBC: 5.9 10*3/uL (ref 3.8–10.8)

## 2020-07-07 LAB — COMPLETE METABOLIC PANEL WITH GFR
AG Ratio: 2.2 (calc) (ref 1.0–2.5)
ALT: 15 U/L (ref 9–46)
AST: 19 U/L (ref 10–35)
Albumin: 4.2 g/dL (ref 3.6–5.1)
Alkaline phosphatase (APISO): 53 U/L (ref 35–144)
BUN/Creatinine Ratio: 34 (calc) — ABNORMAL HIGH (ref 6–22)
BUN: 31 mg/dL — ABNORMAL HIGH (ref 7–25)
CO2: 38 mmol/L — ABNORMAL HIGH (ref 20–32)
Calcium: 9.6 mg/dL (ref 8.6–10.3)
Chloride: 102 mmol/L (ref 98–110)
Creat: 0.92 mg/dL (ref 0.70–1.11)
GFR, Est African American: 86 mL/min/{1.73_m2} (ref >=60)
GFR, Est Non African American: 74 mL/min/{1.73_m2} (ref >=60)
Globulin: 1.9 g/dL (calc) (ref 1.9–3.7)
Glucose, Bld: 96 mg/dL (ref 65–99)
Potassium: 4.7 mmol/L (ref 3.5–5.3)
Sodium: 145 mmol/L (ref 135–146)
Total Bilirubin: 0.6 mg/dL (ref 0.2–1.2)
Total Protein: 6.1 g/dL (ref 6.1–8.1)

## 2020-09-05 ENCOUNTER — Other Ambulatory Visit: Payer: Self-pay | Admitting: Internal Medicine

## 2020-09-08 ENCOUNTER — Other Ambulatory Visit: Payer: Self-pay | Admitting: Internal Medicine

## 2020-09-17 ENCOUNTER — Inpatient Hospital Stay (HOSPITAL_COMMUNITY)
Admission: EM | Admit: 2020-09-17 | Discharge: 2020-10-04 | DRG: 177 | Disposition: E | Payer: Medicare Other | Attending: Internal Medicine | Admitting: Internal Medicine

## 2020-09-17 ENCOUNTER — Other Ambulatory Visit: Payer: Self-pay | Admitting: Internal Medicine

## 2020-09-17 ENCOUNTER — Emergency Department (HOSPITAL_COMMUNITY): Payer: Medicare Other

## 2020-09-17 ENCOUNTER — Other Ambulatory Visit: Payer: Self-pay

## 2020-09-17 DIAGNOSIS — E875 Hyperkalemia: Secondary | ICD-10-CM | POA: Diagnosis not present

## 2020-09-17 DIAGNOSIS — U071 COVID-19: Principal | ICD-10-CM | POA: Diagnosis present

## 2020-09-17 DIAGNOSIS — M419 Scoliosis, unspecified: Secondary | ICD-10-CM | POA: Diagnosis present

## 2020-09-17 DIAGNOSIS — Z9079 Acquired absence of other genital organ(s): Secondary | ICD-10-CM | POA: Diagnosis not present

## 2020-09-17 DIAGNOSIS — J9621 Acute and chronic respiratory failure with hypoxia: Secondary | ICD-10-CM | POA: Diagnosis present

## 2020-09-17 DIAGNOSIS — Z823 Family history of stroke: Secondary | ICD-10-CM

## 2020-09-17 DIAGNOSIS — J9600 Acute respiratory failure, unspecified whether with hypoxia or hypercapnia: Secondary | ICD-10-CM

## 2020-09-17 DIAGNOSIS — J9622 Acute and chronic respiratory failure with hypercapnia: Secondary | ICD-10-CM | POA: Diagnosis present

## 2020-09-17 DIAGNOSIS — J984 Other disorders of lung: Secondary | ICD-10-CM | POA: Diagnosis not present

## 2020-09-17 DIAGNOSIS — J9602 Acute respiratory failure with hypercapnia: Secondary | ICD-10-CM | POA: Diagnosis not present

## 2020-09-17 DIAGNOSIS — R0602 Shortness of breath: Secondary | ICD-10-CM | POA: Diagnosis not present

## 2020-09-17 DIAGNOSIS — E785 Hyperlipidemia, unspecified: Secondary | ICD-10-CM | POA: Diagnosis present

## 2020-09-17 DIAGNOSIS — R0689 Other abnormalities of breathing: Secondary | ICD-10-CM | POA: Diagnosis not present

## 2020-09-17 DIAGNOSIS — J1282 Pneumonia due to coronavirus disease 2019: Secondary | ICD-10-CM | POA: Diagnosis present

## 2020-09-17 DIAGNOSIS — Z9119 Patient's noncompliance with other medical treatment and regimen: Secondary | ICD-10-CM | POA: Diagnosis not present

## 2020-09-17 DIAGNOSIS — Z809 Family history of malignant neoplasm, unspecified: Secondary | ICD-10-CM

## 2020-09-17 DIAGNOSIS — Z8546 Personal history of malignant neoplasm of prostate: Secondary | ICD-10-CM

## 2020-09-17 DIAGNOSIS — J9601 Acute respiratory failure with hypoxia: Secondary | ICD-10-CM | POA: Diagnosis not present

## 2020-09-17 DIAGNOSIS — W19XXXA Unspecified fall, initial encounter: Secondary | ICD-10-CM | POA: Diagnosis not present

## 2020-09-17 DIAGNOSIS — E559 Vitamin D deficiency, unspecified: Secondary | ICD-10-CM | POA: Diagnosis present

## 2020-09-17 DIAGNOSIS — R0902 Hypoxemia: Secondary | ICD-10-CM | POA: Diagnosis not present

## 2020-09-17 DIAGNOSIS — Z8249 Family history of ischemic heart disease and other diseases of the circulatory system: Secondary | ICD-10-CM | POA: Diagnosis not present

## 2020-09-17 DIAGNOSIS — Z833 Family history of diabetes mellitus: Secondary | ICD-10-CM

## 2020-09-17 DIAGNOSIS — Z888 Allergy status to other drugs, medicaments and biological substances status: Secondary | ICD-10-CM

## 2020-09-17 DIAGNOSIS — Z820 Family history of epilepsy and other diseases of the nervous system: Secondary | ICD-10-CM | POA: Diagnosis not present

## 2020-09-17 DIAGNOSIS — H9190 Unspecified hearing loss, unspecified ear: Secondary | ICD-10-CM | POA: Diagnosis present

## 2020-09-17 DIAGNOSIS — Z7982 Long term (current) use of aspirin: Secondary | ICD-10-CM

## 2020-09-17 DIAGNOSIS — I10 Essential (primary) hypertension: Secondary | ICD-10-CM | POA: Diagnosis present

## 2020-09-17 DIAGNOSIS — Z825 Family history of asthma and other chronic lower respiratory diseases: Secondary | ICD-10-CM | POA: Diagnosis not present

## 2020-09-17 DIAGNOSIS — N179 Acute kidney failure, unspecified: Secondary | ICD-10-CM | POA: Diagnosis present

## 2020-09-17 DIAGNOSIS — E7849 Other hyperlipidemia: Secondary | ICD-10-CM | POA: Diagnosis not present

## 2020-09-17 DIAGNOSIS — G9340 Encephalopathy, unspecified: Secondary | ICD-10-CM | POA: Diagnosis present

## 2020-09-17 DIAGNOSIS — Z79899 Other long term (current) drug therapy: Secondary | ICD-10-CM

## 2020-09-17 DIAGNOSIS — R230 Cyanosis: Secondary | ICD-10-CM | POA: Diagnosis present

## 2020-09-17 DIAGNOSIS — S51012A Laceration without foreign body of left elbow, initial encounter: Secondary | ICD-10-CM | POA: Diagnosis not present

## 2020-09-17 DIAGNOSIS — Z66 Do not resuscitate: Secondary | ICD-10-CM | POA: Diagnosis present

## 2020-09-17 DIAGNOSIS — Z8612 Personal history of poliomyelitis: Secondary | ICD-10-CM

## 2020-09-17 DIAGNOSIS — T1490XA Injury, unspecified, initial encounter: Secondary | ICD-10-CM

## 2020-09-17 DIAGNOSIS — R0603 Acute respiratory distress: Secondary | ICD-10-CM | POA: Diagnosis not present

## 2020-09-17 LAB — I-STAT ARTERIAL BLOOD GAS, ED
Acid-Base Excess: 3 mmol/L — ABNORMAL HIGH (ref 0.0–2.0)
Acid-Base Excess: 3 mmol/L — ABNORMAL HIGH (ref 0.0–2.0)
Bicarbonate: 34 mmol/L — ABNORMAL HIGH (ref 20.0–28.0)
Bicarbonate: 32.1 mmol/L — ABNORMAL HIGH (ref 20.0–28.0)
Calcium, Ion: 1.23 mmol/L (ref 1.15–1.40)
Calcium, Ion: 1.18 mmol/L (ref 1.15–1.40)
HCT: 45 % (ref 39.0–52.0)
HCT: 43 % (ref 39.0–52.0)
Hemoglobin: 15.3 g/dL (ref 13.0–17.0)
Hemoglobin: 14.6 g/dL (ref 13.0–17.0)
O2 Saturation: 100 %
O2 Saturation: 99 %
Patient temperature: 101.7
Patient temperature: 99
Potassium: 4.2 mmol/L (ref 3.5–5.1)
Potassium: 4.1 mmol/L (ref 3.5–5.1)
Sodium: 139 mmol/L (ref 135–145)
Sodium: 139 mmol/L (ref 135–145)
TCO2: 37 mmol/L — ABNORMAL HIGH (ref 22–32)
TCO2: 34 mmol/L — ABNORMAL HIGH (ref 22–32)
pCO2 arterial: 94.5 mmHg (ref 32.0–48.0)
pCO2 arterial: 68.1 mmHg (ref 32.0–48.0)
pH, Arterial: 7.174 — CL (ref 7.350–7.450)
pH, Arterial: 7.283 — ABNORMAL LOW (ref 7.350–7.450)
pO2, Arterial: 382 mmHg — ABNORMAL HIGH (ref 83.0–108.0)
pO2, Arterial: 167 mmHg — ABNORMAL HIGH (ref 83.0–108.0)

## 2020-09-17 LAB — COMPREHENSIVE METABOLIC PANEL
ALT: 25 U/L (ref 0–44)
AST: 39 U/L (ref 15–41)
Albumin: 4 g/dL (ref 3.5–5.0)
Alkaline Phosphatase: 53 U/L (ref 38–126)
Anion gap: 13 (ref 5–15)
BUN: 26 mg/dL — ABNORMAL HIGH (ref 8–23)
CO2: 26 mmol/L (ref 22–32)
Calcium: 8.7 mg/dL — ABNORMAL LOW (ref 8.9–10.3)
Chloride: 99 mmol/L (ref 98–111)
Creatinine, Ser: 0.99 mg/dL (ref 0.61–1.24)
GFR, Estimated: >60 mL/min (ref >60)
Glucose, Bld: 194 mg/dL — ABNORMAL HIGH (ref 70–99)
Potassium: 4.1 mmol/L (ref 3.5–5.1)
Sodium: 138 mmol/L (ref 135–145)
Total Bilirubin: 1 mg/dL (ref 0.3–1.2)
Total Protein: 6.9 g/dL (ref 6.5–8.1)

## 2020-09-17 LAB — CBC WITH DIFFERENTIAL/PLATELET
Abs Immature Granulocytes: 0.04 10*3/uL (ref 0.00–0.07)
Basophils Absolute: 0 10*3/uL (ref 0.0–0.1)
Basophils Relative: 0 %
Eosinophils Absolute: 0 10*3/uL (ref 0.0–0.5)
Eosinophils Relative: 0 %
HCT: 47.7 % (ref 39.0–52.0)
Hemoglobin: 15 g/dL (ref 13.0–17.0)
Immature Granulocytes: 0 %
Lymphocytes Relative: 22 %
Lymphs Abs: 2.6 10*3/uL (ref 0.7–4.0)
MCH: 32.2 pg (ref 26.0–34.0)
MCHC: 31.4 g/dL (ref 30.0–36.0)
MCV: 102.4 fL — ABNORMAL HIGH (ref 80.0–100.0)
Monocytes Absolute: 0.7 10*3/uL (ref 0.1–1.0)
Monocytes Relative: 6 %
Neutro Abs: 8.9 10*3/uL — ABNORMAL HIGH (ref 1.7–7.7)
Neutrophils Relative %: 72 %
Platelets: 126 10*3/uL — ABNORMAL LOW (ref 150–400)
RBC: 4.66 MIL/uL (ref 4.22–5.81)
RDW: 12.7 % (ref 11.5–15.5)
WBC: 12.3 10*3/uL — ABNORMAL HIGH (ref 4.0–10.5)
nRBC: 0 % (ref 0.0–0.2)

## 2020-09-17 LAB — RESP PANEL BY RT-PCR (FLU A&B, COVID) ARPGX2
Influenza A by PCR: NEGATIVE
Influenza B by PCR: NEGATIVE
SARS Coronavirus 2 by RT PCR: POSITIVE — AB

## 2020-09-17 LAB — LACTIC ACID, PLASMA: Lactic Acid, Venous: 1.1 mmol/L (ref 0.5–1.9)

## 2020-09-17 LAB — PROCALCITONIN: Procalcitonin: <0.1 ng/mL

## 2020-09-17 LAB — D-DIMER, QUANTITATIVE: D-Dimer, Quant: 0.83 ug/mL-FEU — ABNORMAL HIGH (ref 0.00–0.50)

## 2020-09-17 LAB — TRIGLYCERIDES: Triglycerides: 79 mg/dL (ref <150)

## 2020-09-17 LAB — LACTATE DEHYDROGENASE: LDH: 221 U/L — ABNORMAL HIGH (ref 98–192)

## 2020-09-17 LAB — FIBRINOGEN: Fibrinogen: 514 mg/dL — ABNORMAL HIGH (ref 210–475)

## 2020-09-17 LAB — C-REACTIVE PROTEIN: CRP: 0.8 mg/dL (ref <1.0)

## 2020-09-17 LAB — FERRITIN: Ferritin: 95 ng/mL (ref 24–336)

## 2020-09-17 MED ORDER — PROMETHAZINE-DM 6.25-15 MG/5ML PO SYRP: ORAL_SOLUTION | ORAL | 1 refills | Status: DC

## 2020-09-17 MED ORDER — DEXAMETHASONE 4 MG PO TABS: ORAL_TABLET | ORAL | 0 refills | Status: DC

## 2020-09-17 MED ORDER — HEPARIN SODIUM (PORCINE) 5000 UNIT/ML IJ SOLN
5000.0000 [IU] | Freq: Three times a day (TID) | INTRAMUSCULAR | Status: DC
  Administered 2020-09-18 – 2020-09-19 (×3): 5000 [IU] via SUBCUTANEOUS
  Filled 2020-09-17 (×3): qty 1

## 2020-09-17 MED ORDER — AZITHROMYCIN 250 MG PO TABS: ORAL_TABLET | ORAL | 1 refills | Status: DC

## 2020-09-17 MED ORDER — PREDNISONE 20 MG PO TABS: ORAL_TABLET | ORAL | 0 refills | Status: DC

## 2020-09-17 MED ORDER — DOCUSATE SODIUM 100 MG PO CAPS: 100.0000 mg | ORAL_CAPSULE | Freq: Two times a day (BID) | ORAL | Status: DC | PRN

## 2020-09-17 MED ORDER — METHYLPREDNISOLONE SODIUM SUCC 125 MG IJ SOLR
125.0000 mg | Freq: Once | INTRAMUSCULAR | Status: AC
  Administered 2020-09-17: 125 mg via INTRAVENOUS

## 2020-09-17 MED ORDER — BENZONATATE 200 MG PO CAPS: ORAL_CAPSULE | ORAL | 1 refills | Status: DC

## 2020-09-17 MED ORDER — AZITHROMYCIN 250 MG PO TABS: ORAL_TABLET | ORAL | 1 refills | Status: AC

## 2020-09-17 MED ORDER — ASPIRIN EC 81 MG PO TBEC
81.0000 mg | DELAYED_RELEASE_TABLET | Freq: Every day | ORAL | Status: DC
  Administered 2020-09-18 – 2020-09-22 (×5): 81 mg via ORAL
  Filled 2020-09-17 (×5): qty 1

## 2020-09-17 MED ORDER — BENZONATATE 200 MG PO CAPS
ORAL_CAPSULE | ORAL | 1 refills | Status: DC
Start: 2020-09-17 — End: 2020-09-18

## 2020-09-17 MED ORDER — DEXAMETHASONE 4 MG PO TABS: ORAL_TABLET | ORAL | 0 refills | Status: AC

## 2020-09-17 MED ORDER — POLYETHYLENE GLYCOL 3350 17 G PO PACK: 17.0000 g | PACK | Freq: Every day | ORAL | Status: DC | PRN

## 2020-09-17 MED ORDER — ACETAMINOPHEN 650 MG RE SUPP
650.0000 mg | Freq: Once | RECTAL | Status: AC
  Administered 2020-09-17: 650 mg via RECTAL
  Filled 2020-09-17: qty 1

## 2020-09-17 NOTE — H&P (Signed)
NAME:  Timothy Vaughn, MRN:  [id number], DOB:  1928/11/12, LOS: 0 ADMISSION DATE:  09/15/2020, CONSULTATION DATE:  09/13/2020 REFERRING MD:  Dr. Sabra Heck, EDP, CHIEF COMPLAINT:  covid 19 infection, respiratory failure    History of Present Illness:  The patient is a 87 y.o. gentleman with history of severe kyphorotoscoliosis and chronic hypercapnia not adherent to BIPAP therapy.  He presents with worsening hypoxemia and hypercapnia in the setting of acute COVID-19 infection.  History is obtained from the chart review, ED provider, as well as the patient's son was at bedside.  Apparently the patient was doing well at 10 AM morning of presentation and developed progressive tachypnea, tachycardia fevers.  EMS was called and he was found to be hypoxemic.  He does wear 2 L nasal cannula at home at least nocturnally.  On arrival to the ED he was found to be somnolent lethargic and hypercapnic on ABG.  He was placed on BiPAP.  He is a little bit more alert now, and looks much better per the patient's son.  He is found to be COVID-positive, as were other family members.  Patient has a history of chronic hypercapnia and actually had seen Dr. Lake Bells in clinic in 2017.  At that point he had had an episode of hypercapnia worsened in the setting of an acute respiratory illness.  He had declined using any further noninvasive ventilation nocturnally at that time.  Past Medical History:  Hypertension, restrictive lung disease secondary to kyphorotoscoliosis, hyperlipidemia   Significant Hospital Events:    Consults:    Procedures:    Significant Diagnostic Tests:    Micro Data:  COVID-19 positive  Antimicrobials:    Interim History / Subjective:    Objective   Blood pressure 138/68, pulse 80, temperature (!) 101.7 F (38.7 C), temperature source Rectal, resp. rate (!) 21, height 5\' 3"  (1.6 m), weight 60.8 kg, SpO2 98 %.    Vent Mode: PCV FiO2 (%):  [50 %-100 %] 50 % Set Rate:  [8 bmp] 8  bmp PEEP:  [6 cmH20] 6 cmH20  No intake or output data in the 24 hours ending 09/16/2020 2353 Filed Weights   09/13/2020 2136  Weight: 60.8 kg    Examination: General: chronically ill appearing HENT: dry mucus membranes, tachypnic, on bipap Lungs: decreased air entry bilaterally, obvious kyphorotoscoliosis noted, no wheezes Cardiovascular: tachycardic, regular Abdomen: soft, nontender Extremities: no edema Neuro: awakens to name and verbal stimuli, follows commands GU: no edema  Resolved Hospital Problem list     Assessment & Plan:  Timothy Vaughn is a 87 y.o. gentleman who presents with:  Acute on chronic hypoxemic and hypercapnic respiratory failure secondary to COVID-19 infection Worsened in the setting of severe restrictive lung disease Continue BiPAP.  We will add steroids and remdesivir for severe COVID-19 infection. I discussed with the patient, his son, and his family physician over the phone.  Per the son his father would want a chance to try to get better including a trial of mechanical ventilation but nothing prolonged.  We discussed at maximum a 48 hour trial of intubation if he does not respond to bipap I discussed that outside of this possibility for intubation, CPR would only cause undue suffering and is not medically recommended given his age and comorbidities. He is in agreement.   Acute encephalopathy - secondary to worsening hypercapnia - continue bipap at current settings and management as above  HLD Continue asa  HTN Hold lasix for now, monitor volume  status and electrolytes    Best practice (evaluated daily)  Diet: NPO Pain/Anxiety/Delirium protocol (if indicated):  VAP protocol (if indicated): n/a DVT prophylaxis: [place] GI prophylaxis: n/a Glucose control: goal 140-180, will add SSI Mobility: Bed rest Disposition:ICU  Goals of Care:  Last date of multidisciplinary goals of care discussion: Family and staff present:  Summary of discussion:   Follow up goals of care discussion due:  Code Status: DNR, ok with time limited trial of intubation for now. May need to readdress this if patient unable to tolerate time off bipap. He has refused bipap in the past as an outpatient for restrictive lung disease.   Labs   CBC: Recent Labs  Lab Oct 14, 2020 2149 10/14/20 2152 10-14-2020 2324  WBC  --  12.3*  --   NEUTROABS  --  8.9*  --   HGB 15.3 15.0 14.6  HCT 45.0 47.7 43.0  MCV  --  102.4*  --   PLT  --  126*  --     Basic Metabolic Panel: Recent Labs  Lab 2020-10-14 2149 October 14, 2020 2152 Oct 14, 2020 2324  NA 139 138 139  K 4.2 4.1 4.1  CL  --  99  --   CO2  --  26  --   GLUCOSE  --  194*  --   BUN  --  26*  --   CREATININE  --  0.99  --   CALCIUM  --  8.7*  --    GFR: Estimated Creatinine Clearance: 40.7 mL/min (by C-G formula based on SCr of 0.99 mg/dL). Recent Labs  Lab October 14, 2020 2152  PROCALCITON <0.10  WBC 12.3*  LATICACIDVEN 1.1    Liver Function Tests: Recent Labs  Lab Oct 14, 2020 2152  AST 39  ALT 25  ALKPHOS 53  BILITOT 1.0  PROT 6.9  ALBUMIN 4.0   No results for input(s): LIPASE, AMYLASE in the last 168 hours. No results for input(s): AMMONIA in the last 168 hours.  ABG    Component Value Date/Time   PHART 7.283 (L) October 14, 2020 2324   PCO2ART 68.1 (HH) 10-14-20 2324   PO2ART 167 (H) 10-14-2020 2324   HCO3 32.1 (H) 10/14/2020 2324   TCO2 34 (H) 10-14-2020 2324   O2SAT 99.0 Oct 14, 2020 2324     Coagulation Profile: No results for input(s): INR, PROTIME in the last 168 hours.  Cardiac Enzymes: No results for input(s): CKTOTAL, CKMB, CKMBINDEX, TROPONINI in the last 168 hours.  HbA1C: Hgb A1c MFr Bld  Date/Time Value Ref Range Status  03/31/2020 02:40 PM 5.5 <5.7 % of total Hgb Final    Comment:    For the purpose of screening for the presence of diabetes: . <5.7%       Consistent with the absence of diabetes 5.7-6.4%    Consistent with increased risk for diabetes             (prediabetes) >  or =6.5%  Consistent with diabetes . This assay result is consistent with a decreased risk of diabetes. . Currently, no consensus exists regarding use of hemoglobin A1c for diagnosis of diabetes in children. . According to American Diabetes Association (ADA) guidelines, hemoglobin A1c <7.0% represents optimal control in non-pregnant diabetic patients. Different metrics may apply to specific patient populations.  Standards of Medical Care in Diabetes(ADA). Marland Kitchen   09/23/2019 04:23 PM 5.7 (H) <5.7 % of total Hgb Final    Comment:    For someone without known diabetes, a hemoglobin  A1c value between 5.7% and 6.4%  is consistent with prediabetes and should be confirmed with a  follow-up test. . For someone with known diabetes, a value <7% indicates that their diabetes is well controlled. A1c targets should be individualized based on duration of diabetes, age, comorbid conditions, and other considerations. . This assay result is consistent with an increased risk of diabetes. . Currently, no consensus exists regarding use of hemoglobin A1c for diagnosis of diabetes for children. .     CBG: No results for input(s): GLUCAP in the last 168 hours.  Review of Systems:   Unable to obtain due to mental status  Past Medical History:  He,  has a past medical history of Acute respiratory failure with hypoxia and hypercapnia ([place]) (09/07/2015), AKI (acute kidney injury) ([place]) (09/08/2015), Cancer ([place]), CAP (community acquired pneumonia) (09/07/2015), Hyperlipidemia, Hypertension, Pre-diabetes, and Vitamin D deficiency.   Surgical History:   Past Surgical History:  Procedure Laterality Date  . PROSTATECTOMY  2001     Social History:   reports that he has never smoked. He has never used smokeless tobacco. He reports that he does not drink alcohol and does not use drugs.   Family History:  His family history includes Alzheimer's disease in his father; COPD in his mother; Cancer in his  brother and father; Diabetes in his maternal grandfather; Heart disease in his father and mother; Stroke in his maternal grandfather.   Allergies Allergies  Allergen Reactions  . Lipitor [Atorvastatin] Other (See Comments)    Severe NIV  . Niaspan [Niacin Er] Itching     Home Medications  Prior to Admission medications   Medication Sig Start Date End Date Taking? Authorizing Provider  albuterol (PROVENTIL) (2.5 MG/3ML) 0.083% nebulizer solution Take 3 mLs (2.5 mg total) by nebulization every 6 (six) hours as needed for wheezing or shortness of breath. 09/29/19   McClanahan, Danton Sewer, NP  Albuterol Sulfate (PROAIR RESPICLICK) [id number] (90 Base) MCG/ACT AEPB Inhale 2 Act into the lungs every 6 (six) hours as needed (for shortness of breath). 11/25/18   Liane Comber, NP  Ascorbic Acid (VITAMIN C PO) Take 1 tablet by mouth daily. Takes 400 mg daily.    [provider]  aspirin 81 MG tablet Take 81 mg by mouth daily.    [provider]  azithromycin (ZITHROMAX) 250 MG tablet Take 2 tablets with Food on  Day 1, then 1 tablet Daily with Food for Infection 09/15/2020   Unk Pinto, MD  benzonatate (TESSALON) 200 MG capsule Take 1 perle 3 x / day to prevent cough 09/16/2020   Unk Pinto, MD  Cholecalciferol (VITAMIN D PO) Take 8,000 Int'l Units by mouth daily.     [provider]  dexamethasone (DECADRON) 4 MG tablet Take 1 tab 3 x day - 3 days, then 2 x day - 3 days, then 1 tab daily 09/08/2020   Unk Pinto, MD  furosemide (LASIX) 40 MG tablet Take 1 tablet Daily as Needed for Fluid Retention 09/08/20   Unk Pinto, MD  Multiple Vitamin (MULTI VITAMIN DAILY PO) Take 1 tablet by mouth daily.     [provider]  Omega-3 Fatty Acids (FISH OIL PO) Take 1 capsule by mouth daily.     [provider]  potassium chloride SA (KLOR-CON) 20 MEQ tablet Take      2 tablets       Daily        for Potassium 09/05/20   Unk Pinto, MD   promethazine-dextromethorphan (PROMETHAZINE-DM) 6.25-15 MG/5ML syrup Take 1 to  2 tsp  4 x /day  if needed for cough 09/08/2020   Unk Pinto, MD  umeclidinium-vilanterol [facility] ELLIPTA) 62.5-25 MCG/INH AEPB Inhale 1 puff into the lungs daily. Make sure you rinse your mouth after each use. 11/25/18   Liane Comber, NP     Critical care time: 69    The patient is critically ill with multiple organ systems failure and requires high complexity decision making for assessment and support, frequent evaluation and titration of therapies, application of advanced monitoring technologies and extensive interpretation of multiple databases.   Critical Care Time devoted to patient care services described in this note is 37 minutes. This time reflects time of care of this [place] . This critical care time does not reflect separately billable procedures or procedure time, teaching time or supervisory time of PA/NP/Med student/Med Resident etc but could involve care discussion time.  [facility] 09/18/2020 12:46 AM  Pager: see AMION After hours pager: [phone]

## 2020-09-17 NOTE — ED Notes (Signed)
respiratory at bedside.

## 2020-09-17 NOTE — ED Provider Notes (Signed)
[facility] EMERGENCY DEPARTMENT Provider Note   CSN: [id number] Arrival date & time: 10/02/2020  2129     History Chief Complaint  Patient presents with  . Respiratory Distress  . Covid Positive    Timothy Vaughn is a 87 y.o. male.  HPI   Patient is an 87 year old male, he arrives on BiPAP in severe respiratory distress and is unable to give any information about his care, level 5 caveat applies.  According to the medical record and according to the son who I spoke with on the phone and according to the paramedics, the patient does have a history of possibly having some longstanding hypertension and hyperlipidemia, he takes Lasix, occasional albuterol but no other significant daily prescription medications.  The patient was in his usual state of health this morning feeling well, in the afternoon the patient was feeling more poorly, his son talked him on the phone and brought over a home COVID test.  He tested positive.  The patient then continued to decompensate and went into severe respiratory distress and virtually unresponsive.  The paramedics were called and found the patient to be severely hypoxic in the 50 and 60% range, placed him on BiPAP, he was cyanotic altered and having severe respiratory distress.  He does have a history of polio as a child and has had recurrent lung infections when he gets even the lightest upper respiratory illness.  His son was diagnosed with COVID-19 2 weeks ago  Past Medical History:  Diagnosis Date  . Acute respiratory failure with hypoxia and hypercapnia ([place]) 09/07/2015  . AKI (acute kidney injury) ([place]) 09/08/2015  . Cancer [facility])    prostate  . CAP (community acquired pneumonia) 09/07/2015  . Hyperlipidemia   . Hypertension   . Pre-diabetes   . Vitamin D deficiency     Patient Active Problem List   Diagnosis Date Noted  . Moderate tricuspid regurgitation 09/08/2015  . Nocturnal hypoxia 09/06/2015  . Encounter for Medicare  annual wellness exam 04/12/2015  . Medication management 02/10/2014  . Essential hypertension 08/14/2013  . Hyperlipidemia 08/14/2013  . Other abnormal glucose 08/14/2013  . Vitamin D deficiency 08/14/2013  . Kyphoscoliosis 08/14/2013  . Restrictive lung disease due to kyphoscoliosis 08/14/2013    Past Surgical History:  Procedure Laterality Date  . PROSTATECTOMY  2001       Family History  Problem Relation Age of Onset  . Heart disease Mother   . COPD Mother   . Cancer Father        colon  . Alzheimer's disease Father   . Heart disease Father   . Cancer Brother        stomach  . Stroke Maternal Grandfather   . Diabetes Maternal Grandfather     Social History   Tobacco Use  . Smoking status: Never Smoker  . Smokeless tobacco: Never Used  Substance Use Topics  . Alcohol use: No  . Drug use: No    Home Medications Prior to Admission medications   Medication Sig Start Date End Date Taking? Authorizing Provider  albuterol (PROVENTIL) (2.5 MG/3ML) 0.083% nebulizer solution Take 3 mLs (2.5 mg total) by nebulization every 6 (six) hours as needed for wheezing or shortness of breath. 09/29/19   McClanahan, Danton Sewer, NP  Albuterol Sulfate (PROAIR RESPICLICK) [id number] (90 Base) MCG/ACT AEPB Inhale 2 Act into the lungs every 6 (six) hours as needed (for shortness of breath). 11/25/18   Liane Comber, NP  Ascorbic Acid (VITAMIN C PO)  Take 1 tablet by mouth daily. Takes 400 mg daily.    [provider]  aspirin 81 MG tablet Take 81 mg by mouth daily.    [provider]  azithromycin (ZITHROMAX) 250 MG tablet Take 2 tablets with Food on  Day 1, then 1 tablet Daily with Food for Infection 09/21/2020   Unk Pinto, MD  benzonatate (TESSALON) 200 MG capsule Take 1 perle 3 x / day to prevent cough 09/09/2020   Unk Pinto, MD  Cholecalciferol (VITAMIN D PO) Take 8,000 Int'l Units by mouth daily.     [provider]  dexamethasone (DECADRON) 4 MG tablet Take 1 tab  3 x day - 3 days, then 2 x day - 3 days, then 1 tab daily 09/06/2020   Unk Pinto, MD  furosemide (LASIX) 40 MG tablet Take 1 tablet Daily as Needed for Fluid Retention 09/08/20   Unk Pinto, MD  Multiple Vitamin (MULTI VITAMIN DAILY PO) Take 1 tablet by mouth daily.     [provider]  Omega-3 Fatty Acids (FISH OIL PO) Take 1 capsule by mouth daily.     [provider]  potassium chloride SA (KLOR-CON) 20 MEQ tablet Take      2 tablets       Daily        for Potassium 09/05/20   Unk Pinto, MD  promethazine-dextromethorphan (PROMETHAZINE-DM) 6.25-15 MG/5ML syrup Take 1 to 2 tsp  4 x /day  if needed for cough 09/24/2020   Unk Pinto, MD  umeclidinium-vilanterol [facility] ELLIPTA) 62.5-25 MCG/INH AEPB Inhale 1 puff into the lungs daily. Make sure you rinse your mouth after each use. 11/25/18   Liane Comber, NP    Allergies    Lipitor [atorvastatin] and Niaspan [niacin er]  Review of Systems   Review of Systems  Unable to perform ROS: Acuity of condition    Physical Exam Updated Vital Signs BP (!) 150/64   Pulse 81   Temp (!) 101.7 F (38.7 C) (Rectal)   Resp (!) 27   Ht 1.6 m (5\' 3" )   Wt 60.8 kg   SpO2 97%   BMI 23.74 kg/m   Physical Exam Vitals and nursing note reviewed.  Constitutional:      General: He is in acute distress.     Appearance: He is well-developed and well-nourished. He is ill-appearing and toxic-appearing.     Comments: Somnolent  HENT:     Head: Normocephalic and atraumatic.     Mouth/Throat:     Mouth: Oropharynx is clear and moist.     Pharynx: No oropharyngeal exudate.  Eyes:     General: No scleral icterus.       Right eye: No discharge.        Left eye: No discharge.     Extraocular Movements: EOM normal.     Conjunctiva/sclera: Conjunctivae normal.     Pupils: Pupils are equal, round, and reactive to light.  Neck:     Thyroid: No thyromegaly.     Vascular: No JVD.  Cardiovascular:     Rate and Rhythm: Regular  rhythm. Tachycardia present.     Pulses: Intact distal pulses.     Heart sounds: Normal heart sounds. No murmur heard. No friction rub. No gallop.   Pulmonary:     Effort: Respiratory distress present.     Breath sounds: Wheezing, rhonchi and rales present.  Abdominal:     General: Bowel sounds are normal. There is no distension.  Palpations: Abdomen is soft. There is no mass.     Tenderness: There is no abdominal tenderness.  Musculoskeletal:        General: No tenderness. Normal range of motion.     Cervical back: Normal range of motion and neck supple.     Right lower leg: Edema present.     Left lower leg: Edema present.     Comments: Symmetrical pretibial edema  Lymphadenopathy:     Cervical: No cervical adenopathy.  Skin:    General: Skin is warm and dry.     Findings: No erythema or rash.  Neurological:     Comments: The patient is able to occasionally wake up to loud voice or painful stimuli and follows simple commands but is too weak to sit up and cannot maintain his alertness  Psychiatric:        Mood and Affect: Mood and affect normal.        Behavior: Behavior normal.     ED Results / Procedures / Treatments   Labs (all labs ordered are listed, but only abnormal results are displayed) Labs Reviewed  RESP PANEL BY RT-PCR (FLU A&B, COVID) ARPGX2 - Abnormal; Notable for the following components:      Result Value   SARS Coronavirus 2 by RT PCR POSITIVE (*)    All other components within normal limits  CBC WITH DIFFERENTIAL/PLATELET - Abnormal; Notable for the following components:   WBC 12.3 (*)    MCV 102.4 (*)    Platelets 126 (*)    Neutro Abs 8.9 (*)    All other components within normal limits  COMPREHENSIVE METABOLIC PANEL - Abnormal; Notable for the following components:   Glucose, Bld 194 (*)    BUN 26 (*)    Calcium 8.7 (*)    All other components within normal limits  D-DIMER, QUANTITATIVE (NOT AT [facility]) - Abnormal; Notable for the following  components:   D-Dimer, Quant 0.83 (*)    All other components within normal limits  LACTATE DEHYDROGENASE - Abnormal; Notable for the following components:   LDH 221 (*)    All other components within normal limits  FIBRINOGEN - Abnormal; Notable for the following components:   Fibrinogen 514 (*)    All other components within normal limits  I-STAT ARTERIAL BLOOD GAS, ED - Abnormal; Notable for the following components:   pH, Arterial 7.174 (*)    pCO2 arterial 94.5 (*)    pO2, Arterial 382 (*)    Bicarbonate 34.0 (*)    TCO2 37 (*)    Acid-Base Excess 3.0 (*)    All other components within normal limits  CULTURE, BLOOD (ROUTINE X 2)  CULTURE, BLOOD (ROUTINE X 2)  LACTIC ACID, PLASMA  FERRITIN  TRIGLYCERIDES  C-REACTIVE PROTEIN  LACTIC ACID, PLASMA  PROCALCITONIN  BLOOD GAS, ARTERIAL  POC SARS CORONAVIRUS 2 AG -  ED    EKG EKG performed on September 17, 2020 at 9:56 PM shows normal sinus rhythm, rate of 83, normal axis, normal intervals, normal ST segments, normal T waves, early signs of left ventricular hypertrophy  Radiology DG Chest Portable 1 View  Result Date: 09/27/2020 CLINICAL DATA:  Respiratory distress COVID positive EXAM: PORTABLE CHEST 1 VIEW COMPARISON:  September 08, 2015 FINDINGS: The heart size and mediastinal contours are within normal limits. Mildly hazy airspace opacity seen at the right lung base. The visualized skeletal structures are unchanged with advanced scoliosis. IMPRESSION: Mildly hazy airspace opacity at the right lung base which  could be due to atelectasis and/or early infectious etiology. Electronically Signed   By: Prudencio Pair M.D.   On: 10-11-20 22:04    Procedures .Critical Care Performed by: Noemi Chapel, MD Authorized by: Noemi Chapel, MD   Critical care provider statement:    Critical care time (minutes):  35   Critical care time was exclusive of:  Separately billable procedures and treating other patients and teaching time   Critical  care was necessary to treat or prevent imminent or life-threatening deterioration of the following conditions:  Respiratory failure   Critical care was time spent personally by me on the following activities:  Blood draw for specimens, development of treatment plan with patient or surrogate, discussions with consultants, evaluation of patient's response to treatment, examination of patient, obtaining history from patient or surrogate, ordering and performing treatments and interventions, ordering and review of laboratory studies, ordering and review of radiographic studies, pulse oximetry, re-evaluation of patient's condition and review of old charts   (including critical care time)  Medications Ordered in ED Medications  methylPREDNISolone sodium succinate (SOLU-MEDROL) 125 mg/2 mL injection 125 mg (125 mg Intravenous Given October 11, 2020 2143)  acetaminophen (TYLENOL) suppository 650 mg (650 mg Rectal Given 10/11/20 2203)    ED Course  I have reviewed the triage vital signs and the nursing notes.  Pertinent labs & imaging results that were available during my care of the patient were reviewed by me and considered in my medical decision making (see chart for details).    MDM Rules/Calculators/A&P                           This patient is in acute respiratory distress, he has a life-threatening illness, this is likely COVID-19 pneumonia.  The patient is requiring BiPAP and is actually oxygenating okay with the BiPAP but has significant altered mental status with it.  He has severe increased work of breathing, he is wheezing and moving very little air.  He has been given steroids here in the form of Solu-Medrol, he will be given albuterol treatments, he may need to be intubated.  I discussed with the son his wishes and he states that he would like for everything to be done including intubation and/or CPR, he is a full code.  Given the severity of the patient's condition I suspect that intubation  would be possibly necessary however it may also be something that he does not recover from given his poor underlying lung condition.  He is tachycardic, febrile to the touch, will order the COVID order set.  EKG performed showing no significant abnormalities.  ABG shows a pH of 7.17 with a PCO2 of 94 and a PO2 of 382.  This patient is confused, somnolent becoming obtunded.  Do not think it is safe for him to be on BiPAP as he is not maintaining his airway in a safe fashion and has some significant secretions in the back of his throat that she can hear when he breathes.  At this time I think it would be safest to proceed with intubation.  Reevaluated again at 11:00 PM, the patient is improved, his lactic acid is 1.1, blood count is 12.3 thousand, metabolic panel shows a glucose of 194.  Repeat ABG is pending, anticipate admission to either ICU or stepdown as the patient is still requiring BiPAP.  He has been given antipyretics per rectum, supplemental oxygen and now doing very well on BiPAP.  Mental status improving.  Change of shift, care signed out to Dr. Sedonia Small - to disposition awaiting repeat ABG and ICU consult call back.  Timothy Vaughn was evaluated in Emergency Department on 09/13/2020 for the symptoms described in the history of present illness. He was evaluated in the context of the global COVID-19 pandemic, which necessitated consideration that the patient might be at risk for infection with the SARS-CoV-2 virus that causes COVID-19. Institutional protocols and algorithms that pertain to the evaluation of patients at risk for COVID-19 are in a state of rapid change based on information released by regulatory bodies including the CDC and federal and state organizations. These policies and algorithms were followed during the patient's care in the ED.   Final Clinical Impression(s) / ED Diagnoses Final diagnoses:  Acute respiratory failure with hypoxia and hypercapnia ([place])  COVID-19  Acute  respiratory failure due to COVID-19 [facility])      Noemi Chapel, MD 09/09/2020 2318

## 2020-09-17 NOTE — H&P (Incomplete)
NAME:  Timothy Vaughn, MRN:  [id number], DOB:  1929-07-13, LOS: 0 ADMISSION DATE:  09/24/2020, CONSULTATION DATE:  09/19/2020 REFERRING MD:  Dr. Sabra Heck, EDP, CHIEF COMPLAINT:  covid 19 infection, respiratory failure   Brief History:  ***  History of Present Illness:  ***  Past Medical History:  ***  Significant Hospital Events:  ***  Consults:  ***  Procedures:  ***  Significant Diagnostic Tests:  ***  Micro Data:  ***  Antimicrobials:  ***   Interim History / Subjective:  ***  Objective   Blood pressure 138/68, pulse 80, temperature (!) 101.7 F (38.7 C), temperature source Rectal, resp. rate (!) 21, height 5\' 3"  (1.6 m), weight 60.8 kg, SpO2 98 %.    Vent Mode: PCV FiO2 (%):  [50 %-100 %] 50 % Set Rate:  [8 bmp] 8 bmp PEEP:  [6 cmH20] 6 cmH20  No intake or output data in the 24 hours ending 09/08/2020 2353 Filed Weights   09/16/2020 2136  Weight: 60.8 kg    Examination: General: *** HENT: *** Lungs: *** Cardiovascular: *** Abdomen: *** Extremities: *** Neuro: *** GU: ***  Resolved Hospital Problem list   ***  Assessment & Plan:  ***  Best practice (evaluated daily)  Diet: NPO Pain/Anxiety/Delirium protocol (if indicated):  VAP protocol (if indicated): n/a DVT prophylaxis: [place] GI prophylaxis: n/a Glucose control: goal 140-180 Mobility: Bed rest Disposition:ICU  Goals of Care:  Last date of multidisciplinary goals of care discussion: Family and staff present:  Summary of discussion:  Follow up goals of care discussion due:  Code Status: DNR, ok with time limited trial of intubation for now  Labs   CBC: Recent Labs  Lab 09/18/2020 2149 09/16/2020 2152 09/20/2020 2324  WBC  --  12.3*  --   NEUTROABS  --  8.9*  --   HGB 15.3 15.0 14.6  HCT 45.0 47.7 43.0  MCV  --  102.4*  --   PLT  --  126*  --     Basic Metabolic Panel: Recent Labs  Lab 09/14/2020 2149 09/15/2020 2152 09/07/2020 2324  NA 139 138 139  K 4.2 4.1 4.1  CL  --  99   --   CO2  --  26  --   GLUCOSE  --  194*  --   BUN  --  26*  --   CREATININE  --  0.99  --   CALCIUM  --  8.7*  --    GFR: Estimated Creatinine Clearance: 40.7 mL/min (by C-G formula based on SCr of 0.99 mg/dL). Recent Labs  Lab 09/28/2020 2152  PROCALCITON <0.10  WBC 12.3*  LATICACIDVEN 1.1    Liver Function Tests: Recent Labs  Lab 10/03/2020 2152  AST 39  ALT 25  ALKPHOS 53  BILITOT 1.0  PROT 6.9  ALBUMIN 4.0   No results for input(s): LIPASE, AMYLASE in the last 168 hours. No results for input(s): AMMONIA in the last 168 hours.  ABG    Component Value Date/Time   PHART 7.283 (L) 09/18/2020 2324   PCO2ART 68.1 (HH) 09/26/2020 2324   PO2ART 167 (H) 09/09/2020 2324   HCO3 32.1 (H) 09/28/2020 2324   TCO2 34 (H) 09/24/2020 2324   O2SAT 99.0 09/28/2020 2324     Coagulation Profile: No results for input(s): INR, PROTIME in the last 168 hours.  Cardiac Enzymes: No results for input(s): CKTOTAL, CKMB, CKMBINDEX, TROPONINI in the last 168 hours.  HbA1C: Hgb A1c MFr Bld  Date/Time  Value Ref Range Status  03/31/2020 02:40 PM 5.5 <5.7 % of total Hgb Final    Comment:    For the purpose of screening for the presence of diabetes: . <5.7%       Consistent with the absence of diabetes 5.7-6.4%    Consistent with increased risk for diabetes             (prediabetes) > or =6.5%  Consistent with diabetes . This assay result is consistent with a decreased risk of diabetes. . Currently, no consensus exists regarding use of hemoglobin A1c for diagnosis of diabetes in children. . According to American Diabetes Association (ADA) guidelines, hemoglobin A1c <7.0% represents optimal control in non-pregnant diabetic patients. Different metrics may apply to specific patient populations.  Standards of Medical Care in Diabetes(ADA). Marland Kitchen   09/23/2019 04:23 PM 5.7 (H) <5.7 % of total Hgb Final    Comment:    For someone without known diabetes, a hemoglobin  A1c value between  5.7% and 6.4% is consistent with prediabetes and should be confirmed with a  follow-up test. . For someone with known diabetes, a value <7% indicates that their diabetes is well controlled. A1c targets should be individualized based on duration of diabetes, age, comorbid conditions, and other considerations. . This assay result is consistent with an increased risk of diabetes. . Currently, no consensus exists regarding use of hemoglobin A1c for diagnosis of diabetes for children. .     CBG: No results for input(s): GLUCAP in the last 168 hours.  Review of Systems:   ***  Past Medical History:  He,  has a past medical history of Acute respiratory failure with hypoxia and hypercapnia ([place]) (09/07/2015), AKI (acute kidney injury) ([place]) (09/08/2015), Cancer ([place]), CAP (community acquired pneumonia) (09/07/2015), Hyperlipidemia, Hypertension, Pre-diabetes, and Vitamin D deficiency.   Surgical History:   Past Surgical History:  Procedure Laterality Date  . PROSTATECTOMY  2001     Social History:   reports that he has never smoked. He has never used smokeless tobacco. He reports that he does not drink alcohol and does not use drugs.   Family History:  His family history includes Alzheimer's disease in his father; COPD in his mother; Cancer in his brother and father; Diabetes in his maternal grandfather; Heart disease in his father and mother; Stroke in his maternal grandfather.   Allergies Allergies  Allergen Reactions  . Lipitor [Atorvastatin] Other (See Comments)    Severe NIV  . Niaspan [Niacin Er] Itching     Home Medications  Prior to Admission medications   Medication Sig Start Date End Date Taking? Authorizing Provider  albuterol (PROVENTIL) (2.5 MG/3ML) 0.083% nebulizer solution Take 3 mLs (2.5 mg total) by nebulization every 6 (six) hours as needed for wheezing or shortness of breath. 09/29/19   McClanahan, Danton Sewer, NP  Albuterol Sulfate (PROAIR RESPICLICK) 371 (90 Base)  MCG/ACT AEPB Inhale 2 Act into the lungs every 6 (six) hours as needed (for shortness of breath). 11/25/18   Liane Comber, NP  Ascorbic Acid (VITAMIN C PO) Take 1 tablet by mouth daily. Takes 400 mg daily.    [provider]  aspirin 81 MG tablet Take 81 mg by mouth daily.    [provider]  azithromycin (ZITHROMAX) 250 MG tablet Take 2 tablets with Food on  Day 1, then 1 tablet Daily with Food for Infection 10-11-20   Unk Pinto, MD  benzonatate (TESSALON) 200 MG capsule Take 1 perle 3 x / day to prevent  cough 09/16/2020   Unk Pinto, MD  Cholecalciferol (VITAMIN D PO) Take 8,000 Int'l Units by mouth daily.     [provider]  dexamethasone (DECADRON) 4 MG tablet Take 1 tab 3 x day - 3 days, then 2 x day - 3 days, then 1 tab daily 09/27/2020   Unk Pinto, MD  furosemide (LASIX) 40 MG tablet Take 1 tablet Daily as Needed for Fluid Retention 09/08/20   Unk Pinto, MD  Multiple Vitamin (MULTI VITAMIN DAILY PO) Take 1 tablet by mouth daily.     [provider]  Omega-3 Fatty Acids (FISH OIL PO) Take 1 capsule by mouth daily.     [provider]  potassium chloride SA (KLOR-CON) 20 MEQ tablet Take      2 tablets       Daily        for Potassium 09/05/20   Unk Pinto, MD  promethazine-dextromethorphan (PROMETHAZINE-DM) 6.25-15 MG/5ML syrup Take 1 to 2 tsp  4 x /day  if needed for cough 09/12/2020   Unk Pinto, MD  umeclidinium-vilanterol [facility] ELLIPTA) 62.5-25 MCG/INH AEPB Inhale 1 puff into the lungs daily. Make sure you rinse your mouth after each use. 11/25/18   Liane Comber, NP     Critical care time: ***

## 2020-09-17 NOTE — ED Notes (Signed)
Venida Jarvis, daughter, [phone] would like an update when available

## 2020-09-17 NOTE — ED Notes (Signed)
Solumedrol 125mg  given bby bobby rn

## 2020-09-17 NOTE — ED Triage Notes (Signed)
PER [facility] em[facility] pt coming from home reporting SOB, TESTED POSITIVE FOR COVID today, placed on z pack, decadron, 60% on room air, not able to speak. Chronic lung suppression from polio. Son had covid 2 weeks ago. Placed on cpap bp 220/106, 12 lead wnl, cbg 163, on cpap spo2  74% , 18g in left AC.

## 2020-09-17 NOTE — ED Notes (Signed)
X-ray at bedside

## 2020-09-17 NOTE — ED Notes (Signed)
Pt placed on bipap by respiratory  

## 2020-09-17 NOTE — ED Notes (Signed)
Timothy Vaughn, [phone]

## 2020-09-17 NOTE — ED Provider Notes (Signed)
  Provider Note MRN:  [id number]  Arrival date & time: 09/16/2020    ED Course and Medical Decision Making  Assumed care from Dr. Sabra Heck at shift change.  Covid with hypoxic and hypercarbic respiratory failure, on BIpap improving, will request admit to ICU, awaiting repeat ABG.  .Critical Care Performed by: Maudie Flakes, MD Authorized by: Maudie Flakes, MD   Critical care provider statement:    Critical care time (minutes):  31   Critical care was necessary to treat or prevent imminent or life-threatening deterioration of the following conditions:  Respiratory failure   Critical care was time spent personally by me on the following activities:  Discussions with consultants, evaluation of patient's response to treatment, examination of patient, ordering and performing treatments and interventions, ordering and review of laboratory studies, ordering and review of radiographic studies, pulse oximetry, re-evaluation of patient's condition, obtaining history from patient or surrogate and review of old charts   I assumed direction of critical care for this patient from another provider in my specialty: yes      Final Clinical Impressions(s) / ED Diagnoses     ICD-10-CM   1. Acute respiratory failure with hypoxia and hypercapnia (HCC)  J96.01    J96.02   2. COVID-19  U07.1   3. Acute respiratory failure due to COVID-19 [facility])  U07.1    J96.00     ED Discharge Orders    None      Discharge Instructions   None     Barth Kirks. Sedonia Small, [place] [email or web address]    Maudie Flakes, MD 09/18/20 [phone]

## 2020-09-18 ENCOUNTER — Other Ambulatory Visit: Payer: Self-pay

## 2020-09-18 DIAGNOSIS — M419 Scoliosis, unspecified: Secondary | ICD-10-CM | POA: Diagnosis not present

## 2020-09-18 DIAGNOSIS — J9601 Acute respiratory failure with hypoxia: Secondary | ICD-10-CM

## 2020-09-18 DIAGNOSIS — U071 COVID-19: Secondary | ICD-10-CM | POA: Diagnosis not present

## 2020-09-18 DIAGNOSIS — J984 Other disorders of lung: Secondary | ICD-10-CM | POA: Diagnosis not present

## 2020-09-18 LAB — CBC
HCT: 43.6 % (ref 39.0–52.0)
HCT: 41 % (ref 39.0–52.0)
Hemoglobin: 13.7 g/dL (ref 13.0–17.0)
Hemoglobin: 13.7 g/dL (ref 13.0–17.0)
MCH: 32.1 pg (ref 26.0–34.0)
MCH: 33.7 pg (ref 26.0–34.0)
MCHC: 31.4 g/dL (ref 30.0–36.0)
MCHC: 33.4 g/dL (ref 30.0–36.0)
MCV: 102.1 fL — ABNORMAL HIGH (ref 80.0–100.0)
MCV: 100.7 fL — ABNORMAL HIGH (ref 80.0–100.0)
Platelets: 114 10*3/uL — ABNORMAL LOW (ref 150–400)
Platelets: 107 10*3/uL — ABNORMAL LOW (ref 150–400)
RBC: 4.27 MIL/uL (ref 4.22–5.81)
RBC: 4.07 MIL/uL — ABNORMAL LOW (ref 4.22–5.81)
RDW: 12.6 % (ref 11.5–15.5)
RDW: 12.8 % (ref 11.5–15.5)
WBC: 7.7 10*3/uL (ref 4.0–10.5)
WBC: 8.1 10*3/uL (ref 4.0–10.5)
nRBC: 0 % (ref 0.0–0.2)
nRBC: 0 % (ref 0.0–0.2)

## 2020-09-18 LAB — CBG MONITORING, ED
Glucose-Capillary: 158 mg/dL — ABNORMAL HIGH (ref 70–99)
Glucose-Capillary: 135 mg/dL — ABNORMAL HIGH (ref 70–99)
Glucose-Capillary: 187 mg/dL — ABNORMAL HIGH (ref 70–99)
Glucose-Capillary: 177 mg/dL — ABNORMAL HIGH (ref 70–99)
Glucose-Capillary: 198 mg/dL — ABNORMAL HIGH (ref 70–99)
Glucose-Capillary: 190 mg/dL — ABNORMAL HIGH (ref 70–99)

## 2020-09-18 LAB — BASIC METABOLIC PANEL
Anion gap: 11 (ref 5–15)
BUN: 27 mg/dL — ABNORMAL HIGH (ref 8–23)
CO2: 27 mmol/L (ref 22–32)
Calcium: 8.4 mg/dL — ABNORMAL LOW (ref 8.9–10.3)
Chloride: 103 mmol/L (ref 98–111)
Creatinine, Ser: 0.98 mg/dL (ref 0.61–1.24)
GFR, Estimated: >60 mL/min (ref >60)
Glucose, Bld: 119 mg/dL — ABNORMAL HIGH (ref 70–99)
Potassium: 4.1 mmol/L (ref 3.5–5.1)
Sodium: 141 mmol/L (ref 135–145)

## 2020-09-18 LAB — CREATININE, SERUM
Creatinine, Ser: 0.92 mg/dL (ref 0.61–1.24)
GFR, Estimated: >60 mL/min (ref >60)

## 2020-09-18 LAB — GLUCOSE, CAPILLARY: Glucose-Capillary: 133 mg/dL — ABNORMAL HIGH (ref 70–99)

## 2020-09-18 LAB — LACTIC ACID, PLASMA: Lactic Acid, Venous: 0.7 mmol/L (ref 0.5–1.9)

## 2020-09-18 MED ORDER — CHLORHEXIDINE GLUCONATE CLOTH 2 % EX PADS: 6.0000 | MEDICATED_PAD | Freq: Every day | CUTANEOUS | Status: DC

## 2020-09-18 MED ORDER — PREDNISONE 20 MG PO TABS: 50.0000 mg | ORAL_TABLET | Freq: Every day | ORAL | Status: DC

## 2020-09-18 MED ORDER — INSULIN ASPART 100 UNIT/ML ~~LOC~~ SOLN
0.0000 [IU] | SUBCUTANEOUS | Status: DC
  Administered 2020-09-18 (×2): 3 [IU] via SUBCUTANEOUS
  Administered 2020-09-18: 2 [IU] via SUBCUTANEOUS
  Administered 2020-09-18: 3 [IU] via SUBCUTANEOUS
  Administered 2020-09-19: 2 [IU] via SUBCUTANEOUS

## 2020-09-18 MED ORDER — SODIUM CHLORIDE 0.9 % IV SOLN
100.0000 mg | Freq: Every day | INTRAVENOUS | Status: AC
  Administered 2020-09-19 – 2020-09-22 (×4): 100 mg via INTRAVENOUS
  Filled 2020-09-18 (×4): qty 20

## 2020-09-18 MED ORDER — METHYLPREDNISOLONE SODIUM SUCC 40 MG IJ SOLR
30.0000 mg | Freq: Two times a day (BID) | INTRAMUSCULAR | Status: DC
  Administered 2020-09-18 – 2020-09-19 (×4): 30 mg via INTRAVENOUS
  Filled 2020-09-18 (×4): qty 1

## 2020-09-18 MED ORDER — SODIUM CHLORIDE 0.9 % IV SOLN
200.0000 mg | Freq: Once | INTRAVENOUS | Status: AC
  Administered 2020-09-18: 200 mg via INTRAVENOUS
  Filled 2020-09-18: qty 40

## 2020-09-18 NOTE — Progress Notes (Signed)
Changed pt to 8lpm HFNC ([place]) Spo2 99%

## 2020-09-18 NOTE — ED Notes (Addendum)
Patient transported upstairs.   While transporting found patient to be laying in obscene amounts of urine with urinal still stuck between his legs.   This RN took over care of this patient at 2045. Was unable to access d/t lack of proper staffing in ED and change of assignments.   No skin break down was noted   ICU RN made aware.

## 2020-09-18 NOTE — Progress Notes (Signed)
Patient stated a few years ago he got hit in the right eye with a tennis ball and his right pupil has been bigger ever since. Upon admission to the ICU, his right pupil was 5 and left pupil was 2.

## 2020-09-18 NOTE — ED Notes (Signed)
Pt more alert and talkative at this time, able to speak in coherent sentences, GCS15, able to speak to family. Pt states he became symptomatic with a sore throat for about a week but did not have trouble breathing until tonight 09/16/2020, continued audible wheezing noted. Pt tolerating bipap settings well, spo2 100% with 50% o2. Sinus brady on monitor, but other vss. Call bell in reach, side rails up.

## 2020-09-18 NOTE — Progress Notes (Signed)
Patient transported to 3M11 without incidence.

## 2020-09-18 NOTE — Progress Notes (Signed)
NAME:  Timothy Vaughn, MRN:  [id number], DOB:  [birth year], LOS: 1 ADMISSION DATE:  09/16/2020, CONSULTATION DATE:  09/18/2020 REFERRING MD:  Dr. Sabra Heck, EDP, CHIEF COMPLAINT:  covid 19 infection, respiratory failure    History of Present Illness:  The patient is a 87 y.o. gentleman with history of severe kyphorotoscoliosis and chronic hypercapnia not adherent to BIPAP therapy.  He presents with worsening hypoxemia and hypercapnia in the setting of acute COVID-19 infection.  History is obtained from the chart review, ED provider, as well as the patient's son was at bedside.  Apparently the patient was doing well at 10 AM morning of presentation and developed progressive tachypnea, tachycardia fevers.  EMS was called and he was found to be hypoxemic.  He does wear 2 L nasal cannula at home at least nocturnally.  On arrival to the ED he was found to be somnolent lethargic and hypercapnic on ABG.  He was placed on BiPAP.  He is a little bit more alert now, and looks much better per the patient's son.  He is found to be COVID-positive, as were other family members.  Patient has a history of chronic hypercapnia and actually had seen Dr. Lake Bells in clinic in 2017.  At that point he had had an episode of hypercapnia worsened in the setting of an acute respiratory illness.  He had declined using any further noninvasive ventilation nocturnally at that time.  Past Medical History:  Hypertension, restrictive lung disease secondary to kyphorotoscoliosis, hyperlipidemia   Significant Hospital Events:    Consults:    Procedures:    Significant Diagnostic Tests:    Micro Data:  COVID-19 positive  Antimicrobials:    Interim History / Subjective:   Taken off BiPAP this morning.  Now on 4 L nasal cannula slowly weaning.  Objective   Blood pressure (!) 157/73, pulse 80, temperature (!) 97.3 F (36.3 C), temperature source Axillary, resp. rate (!) 22, height 5\' 3"  (1.6 m), weight 60.8 kg, SpO2  99 %.    Vent Mode: PCV;BIPAP FiO2 (%):  [50 %-100 %] 50 % Set Rate:  [8 bmp] 8 bmp PEEP:  [6 cmH20] 6 cmH20   Intake/Output Summary (Last 24 hours) at 09/18/2020 0851 Last data filed at 09/18/2020 [id number] Gross per 24 hour  Intake 293.01 ml  Output 100 ml  Net 193.01 ml   Filed Weights   09/11/2020 2136  Weight: 60.8 kg    Examination: General: Elderly, chronically ill-appearing gentleman. HENT: Dry mucous membranes Lungs: Diminished air entry bilaterally, significant kyphoscoliosis no wheeze Cardiovascular: Tachycardic, regular Abdomen: Soft, nontender nondistended Extremities: No significant edema Neuro: Awake alert following commands GU: Deferred  Resolved Hospital Problem list     Assessment & Plan:  Timothy Vaughn is a 87 y.o. gentleman who presents with:  Acute on chronic hypoxemic and hypercapnic respiratory failure secondary to COVID-19 infection Patient has restrictive lung disease secondary to kyphoscoliosis Continue BiPAP as needed.  At this time peers comfortable on nasal cannula. I think we can downgrade from an ICU admission to stepdown unit. Orders placed Spo2 goal >90%  Complete course of remdesivir that was started already  Continue methylpredisolone   Acute encephalopathy, resolved  - was likely related to hypercapenia   HLD Continue aspirin   HTN Holding lasix    Best practice (evaluated daily)  Diet: NPO Pain/Anxiety/Delirium protocol (if indicated):  VAP protocol (if indicated): n/a DVT prophylaxis: [facility] GI prophylaxis: n/a Glucose control: goal 140-180, will add SSI Mobility: Bed rest  Disposition: step down/ progressive care   Goals of Care:  Last date of multidisciplinary goals of care discussion: Family and staff present:  Summary of discussion:  Follow up goals of care discussion due:  Code Status: DNR, ok with time limited trial of intubation for now. May need to readdress this if patient unable to tolerate time off bipap. He  has refused bipap in the past as an outpatient for restrictive lung disease.   I discussed plans with son who was at bedside.   Labs   CBC: Recent Labs  Lab 10/02/2020 2149 09/26/2020 2152 09/13/2020 2324 09/18/20 0100 09/18/20 0340  WBC  --  12.3*  --  7.7 8.1  NEUTROABS  --  8.9*  --   --   --   HGB 15.3 15.0 14.6 13.7 13.7  HCT 45.0 47.7 43.0 43.6 41.0  MCV  --  102.4*  --  102.1* 100.7*  PLT  --  126*  --  114* 107*    Basic Metabolic Panel: Recent Labs  Lab 09/28/2020 2149 10/01/2020 2152 09/10/2020 2324 09/18/20 0100 09/18/20 0340  NA 139 138 139  --  141  K 4.2 4.1 4.1  --  4.1  CL  --  99  --   --  103  CO2  --  26  --   --  27  GLUCOSE  --  194*  --   --  119*  BUN  --  26*  --   --  27*  CREATININE  --  0.99  --  0.92 0.98  CALCIUM  --  8.7*  --   --  8.4*   GFR: Estimated Creatinine Clearance: 41.1 mL/min (by C-G formula based on SCr of 0.98 mg/dL). Recent Labs  Lab 10/02/2020 2152 09/08/2020 2351 09/18/20 0100 09/18/20 0340  PROCALCITON <0.10  --   --   --   WBC 12.3*  --  7.7 8.1  LATICACIDVEN 1.1 0.7  --   --     Liver Function Tests: Recent Labs  Lab 10/02/2020 2152  AST 39  ALT 25  ALKPHOS 53  BILITOT 1.0  PROT 6.9  ALBUMIN 4.0   No results for input(s): LIPASE, AMYLASE in the last 168 hours. No results for input(s): AMMONIA in the last 168 hours.  ABG    Component Value Date/Time   PHART 7.283 (L) 09/27/2020 2324   PCO2ART 68.1 (HH) 09/29/2020 2324   PO2ART 167 (H) 09/24/2020 2324   HCO3 32.1 (H) 09/29/2020 2324   TCO2 34 (H) 09/07/2020 2324   O2SAT 99.0 10/02/2020 2324     Coagulation Profile: No results for input(s): INR, PROTIME in the last 168 hours.  Cardiac Enzymes: No results for input(s): CKTOTAL, CKMB, CKMBINDEX, TROPONINI in the last 168 hours.  HbA1C: Hgb A1c MFr Bld  Date/Time Value Ref Range Status  03/31/2020 02:40 PM 5.5 <5.7 % of total Hgb Final    Comment:    For the purpose of screening for the presence  of diabetes: . <5.7%       Consistent with the absence of diabetes 5.7-6.4%    Consistent with increased risk for diabetes             (prediabetes) > or =6.5%  Consistent with diabetes . This assay result is consistent with a decreased risk of diabetes. . Currently, no consensus exists regarding use of hemoglobin A1c for diagnosis of diabetes in children. . According to American Diabetes Association (ADA) guidelines, hemoglobin A1c <  7.0% represents optimal control in non-pregnant diabetic patients. Different metrics may apply to specific patient populations.  Standards of Medical Care in Diabetes(ADA). Marland Kitchen   09/23/2019 04:23 PM 5.7 (H) <5.7 % of total Hgb Final    Comment:    For someone without known diabetes, a hemoglobin  A1c value between 5.7% and 6.4% is consistent with prediabetes and should be confirmed with a  follow-up test. . For someone with known diabetes, a value <7% indicates that their diabetes is well controlled. A1c targets should be individualized based on duration of diabetes, age, comorbid conditions, and other considerations. . This assay result is consistent with an increased risk of diabetes. . Currently, no consensus exists regarding use of hemoglobin A1c for diagnosis of diabetes for children. .     CBG: Recent Labs  Lab 09/18/20 0107 09/18/20 0752  GLUCAP 158* 135*    Review of Systems:   Unable to obtain due to mental status  Past Medical History:  He,  has a past medical history of Acute respiratory failure with hypoxia and hypercapnia ([place]) (09/07/2015), AKI (acute kidney injury) ([place]) (09/08/2015), Cancer ([place]), CAP (community acquired pneumonia) (09/07/2015), Hyperlipidemia, Hypertension, Pre-diabetes, and Vitamin D deficiency.   Surgical History:   Past Surgical History:  Procedure Laterality Date  . PROSTATECTOMY  2001     Social History:   reports that he has never smoked. He has never used smokeless tobacco. He reports that  he does not drink alcohol and does not use drugs.   Family History:  His family history includes Alzheimer's disease in his father; COPD in his mother; Cancer in his brother and father; Diabetes in his maternal grandfather; Heart disease in his father and mother; Stroke in his maternal grandfather.   Allergies Allergies  Allergen Reactions  . Lipitor [Atorvastatin] Other (See Comments)    Severe NIV  . Niaspan [Niacin Er] Itching     Home Medications  Prior to Admission medications   Medication Sig Start Date End Date Taking? Authorizing Provider  albuterol (PROVENTIL) (2.5 MG/3ML) 0.083% nebulizer solution Take 3 mLs (2.5 mg total) by nebulization every 6 (six) hours as needed for wheezing or shortness of breath. 09/29/19   McClanahan, Danton Sewer, NP  Albuterol Sulfate (PROAIR RESPICLICK) 128 (90 Base) MCG/ACT AEPB Inhale 2 Act into the lungs every 6 (six) hours as needed (for shortness of breath). 11/25/18   Liane Comber, NP  Ascorbic Acid (VITAMIN C PO) Take 1 tablet by mouth daily. Takes 400 mg daily.    [provider]  aspirin 81 MG tablet Take 81 mg by mouth daily.    [provider]  azithromycin (ZITHROMAX) 250 MG tablet Take 2 tablets with Food on  Day 1, then 1 tablet Daily with Food for Infection 09/10/2020   Unk Pinto, MD  benzonatate (TESSALON) 200 MG capsule Take 1 perle 3 x / day to prevent cough 09/27/2020   Unk Pinto, MD  Cholecalciferol (VITAMIN D PO) Take 8,000 Int'l Units by mouth daily.     [provider]  dexamethasone (DECADRON) 4 MG tablet Take 1 tab 3 x day - 3 days, then 2 x day - 3 days, then 1 tab daily 09/30/2020   Unk Pinto, MD  furosemide (LASIX) 40 MG tablet Take 1 tablet Daily as Needed for Fluid Retention 09/08/20   Unk Pinto, MD  Multiple Vitamin (MULTI VITAMIN DAILY PO) Take 1 tablet by mouth daily.     [provider]  Omega-3 Fatty Acids (FISH OIL  PO) Take 1 capsule by mouth daily.     [provider]  potassium chloride SA (KLOR-CON) 20 MEQ tablet Take      2 tablets       Daily        for Potassium 09/05/20   Unk Pinto, MD  promethazine-dextromethorphan (PROMETHAZINE-DM) 6.25-15 MG/5ML syrup Take 1 to 2 tsp  4 x /day  if needed for cough 09/26/2020   Unk Pinto, MD  umeclidinium-vilanterol [facility] ELLIPTA) 62.5-25 MCG/INH AEPB Inhale 1 puff into the lungs daily. Make sure you rinse your mouth after each use. 11/25/18   Liane Comber, NP    This patient is critically ill with multiple organ system failure; which, requires frequent high complexity decision making, assessment, support, evaluation, and titration of therapies. This was completed through the application of advanced monitoring technologies and extensive interpretation of multiple databases. During this encounter critical care time was devoted to patient care services described in this note for 32 minutes.  Garner Nash, DO [facility] 09/18/2020 8:51 AM

## 2020-09-18 NOTE — ED Notes (Signed)
Found patient unresponsive with HR decreasing. Patient Sp02 noted to be low as well. Noted partial code status of patient. Patient noted to have gasping breaths, unresponsive and diaphoretic. Vent machine at bedside on stand by, resumed by charge nurse. place mask back on patient. Noted Sp02 and HR improved. RT and primary nurse notified.

## 2020-09-18 NOTE — ED Notes (Signed)
Attempted to give report, asked to call back.  

## 2020-09-18 NOTE — Progress Notes (Signed)
Progress update:  Notified by RN that patient was obtunded and hypoxemic. Mental status improved somewhat while on bipap.  Given his severe kyphorotoscoliosis and covid 19 infection with advanced age, invasive mechanical ventilation would be futile and likely not survivable.  I have discussed this with his son Lanny Hurst.  He is DNR and this has been reflected in the chart.  Do not intubate.  No escalation of care. He is appropriate to admit to PCU and should he have a decline in hemodynamics or respiratory status, his family should be called with plan to transition to comfort measures.  Lenice Llamas, MD Pulmonary and [place] Pager: see AMION [phone]

## 2020-09-18 NOTE — Progress Notes (Signed)
Placed patient on BIPAP due to low spo2 and respiratory difficulty. Patient now lethargic however Spo2 is now 94 on charted settings.

## 2020-09-18 NOTE — ED Notes (Signed)
This RN saw pt was being assisted by another staff RN d/t to his change in condition, this RN called RT & checked code status. CN came to bedside to assist further & BIPAP was put on pt.

## 2020-09-18 NOTE — ED Notes (Signed)
Daughter Timothy Vaughn updated.

## 2020-09-18 NOTE — ED Notes (Signed)
Pt assisted with urinal only 100cc out put

## 2020-09-18 NOTE — TOC Initial Note (Signed)
Transition of Care [facility]) - Initial/Assessment Note    Patient Details  Name: Timothy Vaughn MRN: [id number] Date of Birth: 09-11-1928  [facility]) CM/SW Contact:    Verdell Carmine, RN Phone Number: 09/18/2020, 12:18 PM  Clinical Narrative:                 87 year old admitted for COVID pneumoina, Has history of COPD, uses 2 LPM of oxygen at home, and has Bipapa at home, which is sometimes non compliant with. Has some DME by [employer], not receiving any HH services CM will follow for needs  Expected Discharge Plan: [place] Barriers to Discharge: Continued Medical Work up   Patient Goals and CMS Choice        Expected Discharge Plan and Services Expected Discharge Plan: [place]   Discharge Planning Services: CM Consult                                          Prior Living Arrangements/Services     Patient language and need for interpreter reviewed:: Yes        Need for Family Participation in Patient Care: Yes (Comment) Care giver support system in place?: Yes (comment)   Criminal Activity/Legal Involvement Pertinent to Current Situation/Hospitalization: No - Comment as needed  Activities of Daily Living      Permission Sought/Granted                  Emotional Assessment       Orientation: : Oriented to Self,Fluctuating Orientation (Suspected and/or reported Sundowners) Alcohol / Substance Use: Not Applicable Psych Involvement: No (comment)  Admission diagnosis:  Acute hypoxemic respiratory failure due to COVID-19 ([place]) [U07.1, J96.01] Patient Active Problem List   Diagnosis Date Noted  . Acute hypoxemic respiratory failure due to COVID-19 ([place]) September 22, 2020  . Moderate tricuspid regurgitation 09/08/2015  . Nocturnal hypoxia 09/06/2015  . Encounter for Medicare annual wellness exam 04/12/2015  . Medication management 02/10/2014  . Essential hypertension 08/14/2013  . Hyperlipidemia  08/14/2013  . Other abnormal glucose 08/14/2013  . Vitamin D deficiency 08/14/2013  . Kyphoscoliosis 08/14/2013  . Restrictive lung disease due to kyphoscoliosis 08/14/2013   PCP:  Unk Pinto, MD Pharmacy:   [place], [address] [place] [place] [address] Phone: [phone] Fax: [phone]  [facility] [place], [place] [address] [place] [address] Phone: [phone] Fax: [phone]  [id number] - [place], [address] [place] [address] Phone: [phone] Fax: [phone]  [id number] - [place], [place] [address] [place] [address] Phone: [phone] Fax: [phone]     Social Determinants of Health (SDOH) Interventions    Readmission Risk Interventions No flowsheet data found.

## 2020-09-18 NOTE — ED Notes (Signed)
Respiratory at bedside.

## 2020-09-19 DIAGNOSIS — J9601 Acute respiratory failure with hypoxia: Secondary | ICD-10-CM | POA: Diagnosis not present

## 2020-09-19 DIAGNOSIS — J9602 Acute respiratory failure with hypercapnia: Secondary | ICD-10-CM

## 2020-09-19 LAB — COMPREHENSIVE METABOLIC PANEL
ALT: 47 U/L — ABNORMAL HIGH (ref 0–44)
AST: 42 U/L — ABNORMAL HIGH (ref 15–41)
Albumin: 3.4 g/dL — ABNORMAL LOW (ref 3.5–5.0)
Alkaline Phosphatase: 61 U/L (ref 38–126)
Anion gap: 11 (ref 5–15)
BUN: 46 mg/dL — ABNORMAL HIGH (ref 8–23)
CO2: 34 mmol/L — ABNORMAL HIGH (ref 22–32)
Calcium: 8.5 mg/dL — ABNORMAL LOW (ref 8.9–10.3)
Chloride: 100 mmol/L (ref 98–111)
Creatinine, Ser: 1.24 mg/dL (ref 0.61–1.24)
GFR, Estimated: 56 mL/min — ABNORMAL LOW (ref >60)
Glucose, Bld: 144 mg/dL — ABNORMAL HIGH (ref 70–99)
Potassium: 3.9 mmol/L (ref 3.5–5.1)
Sodium: 145 mmol/L (ref 135–145)
Total Bilirubin: 0.7 mg/dL (ref 0.3–1.2)
Total Protein: 6.5 g/dL (ref 6.5–8.1)

## 2020-09-19 LAB — CBC WITH DIFFERENTIAL/PLATELET
Abs Immature Granulocytes: 0.06 10*3/uL (ref 0.00–0.07)
Basophils Absolute: 0 10*3/uL (ref 0.0–0.1)
Basophils Relative: 0 %
Eosinophils Absolute: 0 10*3/uL (ref 0.0–0.5)
Eosinophils Relative: 0 %
HCT: 47.1 % (ref 39.0–52.0)
Hemoglobin: 15.3 g/dL (ref 13.0–17.0)
Immature Granulocytes: 1 %
Lymphocytes Relative: 8 %
Lymphs Abs: 1 10*3/uL (ref 0.7–4.0)
MCH: 33.1 pg (ref 26.0–34.0)
MCHC: 32.5 g/dL (ref 30.0–36.0)
MCV: 101.9 fL — ABNORMAL HIGH (ref 80.0–100.0)
Monocytes Absolute: 0.6 10*3/uL (ref 0.1–1.0)
Monocytes Relative: 5 %
Neutro Abs: 10.9 10*3/uL — ABNORMAL HIGH (ref 1.7–7.7)
Neutrophils Relative %: 86 %
Platelets: 120 10*3/uL — ABNORMAL LOW (ref 150–400)
RBC: 4.62 MIL/uL (ref 4.22–5.81)
RDW: 12.7 % (ref 11.5–15.5)
WBC: 12.6 10*3/uL — ABNORMAL HIGH (ref 4.0–10.5)
nRBC: 0 % (ref 0.0–0.2)

## 2020-09-19 LAB — GLUCOSE, CAPILLARY
Glucose-Capillary: 137 mg/dL — ABNORMAL HIGH (ref 70–99)
Glucose-Capillary: 110 mg/dL — ABNORMAL HIGH (ref 70–99)

## 2020-09-19 LAB — RETICULOCYTES
Immature Retic Fract: 8.4 % (ref 2.3–15.9)
RBC.: 4.62 MIL/uL (ref 4.22–5.81)
Retic Count, Absolute: 47.1 10*3/uL (ref 19.0–186.0)
Retic Ct Pct: 1 % (ref 0.4–3.1)

## 2020-09-19 LAB — D-DIMER, QUANTITATIVE: D-Dimer, Quant: 2.02 ug/mL-FEU — ABNORMAL HIGH (ref 0.00–0.50)

## 2020-09-19 LAB — MRSA PCR SCREENING: MRSA by PCR: NEGATIVE

## 2020-09-19 LAB — C-REACTIVE PROTEIN: CRP: 14 mg/dL — ABNORMAL HIGH (ref <1.0)

## 2020-09-19 LAB — MAGNESIUM: Magnesium: 2.2 mg/dL (ref 1.7–2.4)

## 2020-09-19 MED ORDER — METHYLPREDNISOLONE SODIUM SUCC 40 MG IJ SOLR
30.0000 mg | Freq: Two times a day (BID) | INTRAMUSCULAR | Status: DC
  Administered 2020-09-19 – 2020-09-22 (×6): 30 mg via INTRAVENOUS
  Filled 2020-09-19 (×6): qty 1

## 2020-09-19 MED ORDER — SENNOSIDES-DOCUSATE SODIUM 8.6-50 MG PO TABS
1.0000 | ORAL_TABLET | Freq: Two times a day (BID) | ORAL | Status: DC
  Administered 2020-09-20 – 2020-09-22 (×4): 1 via ORAL
  Filled 2020-09-19 (×5): qty 1

## 2020-09-19 MED ORDER — METHYLPREDNISOLONE SODIUM SUCC 125 MG IJ SOLR: 60.0000 mg | Freq: Two times a day (BID) | INTRAMUSCULAR | Status: DC

## 2020-09-19 MED ORDER — CHLORHEXIDINE GLUCONATE CLOTH 2 % EX PADS
6.0000 | MEDICATED_PAD | Freq: Every day | CUTANEOUS | Status: DC
Start: 2020-09-19 — End: 2020-09-23
  Administered 2020-09-19 – 2020-09-22 (×4): 6 via TOPICAL

## 2020-09-19 MED ORDER — ENOXAPARIN SODIUM 40 MG/0.4ML ~~LOC~~ SOLN
40.0000 mg | SUBCUTANEOUS | Status: DC
  Administered 2020-09-19 – 2020-09-22 (×4): 40 mg via SUBCUTANEOUS
  Filled 2020-09-19 (×4): qty 0.4

## 2020-09-19 MED ORDER — IPRATROPIUM-ALBUTEROL 20-100 MCG/ACT IN AERS
1.0000 | INHALATION_SPRAY | Freq: Four times a day (QID) | RESPIRATORY_TRACT | Status: DC
  Administered 2020-09-19 – 2020-09-22 (×13): 1 via RESPIRATORY_TRACT
  Filled 2020-09-19 (×2): qty 4

## 2020-09-19 MED ORDER — FUROSEMIDE 10 MG/ML IJ SOLN
20.0000 mg | Freq: Once | INTRAMUSCULAR | Status: AC
  Administered 2020-09-19: 20 mg via INTRAVENOUS
  Filled 2020-09-19: qty 2

## 2020-09-19 NOTE — Progress Notes (Signed)
[facility] Progress Note  Patient: Timothy Vaughn    RKY:[id number]  DOA: 09/19/2020     Date of Service: the patient was seen and examined on 09/19/2020  Brief hospital course: Past medical history of kyphoscoliosis with chronic hypercapnia noncompliant with BiPAP therapy at home.  Presents with complaints of shortness of breath and cough as well as confusion.  Found to have acute hypercarbic respiratory failure needing BiPAP. Currently plan is continue close monitoring.  Assessment and Plan: Acute on chronic hypoxemic and hypercapnic respiratory failure secondary to COVID-19 infection Patient has restrictive lung disease secondary to kyphoscoliosis Treated with BiPAP therapy. Noncompliant with BiPAP therapy at home. At this time peers comfortable on nasal cannula. Spo2 goal >90%  Complete course of remdesivir that was started already  Continue methylpredisolone   Acute encephalopathy, resolved  was likely related to hypercapenia   HLD Continue aspirin   HTN Holding lasix   Body mass index is 24.49 kg/m.   Diet: Regular diet DVT Prophylaxis:   enoxaparin (LOVENOX) injection 40 mg Start: 09/19/20 1100 SCDs Start: 09/19/2020 2351    Advance goals of care discussion: DNR  Family Communication: no family was present at bedside, at the time of interview.   Disposition:  Status is: Inpatient  Remains inpatient appropriate because:IV treatments appropriate due to intensity of illness or inability to take PO and Inpatient level of care appropriate due to severity of illness   Dispo: The patient is from: Home              Anticipated d/c is to: SNF              Anticipated d/c date is: 3 days              Patient currently is not medically stable to d/c.  Subjective: No nausea no vomiting.  Continues to have cough and congestion some shortness of breath.  No fever no chills.  Physical Exam:  General: Appear in mild distress, no Rash; Oral Mucosa Clear, moist.  no Abnormal Neck Mass Or lumps, Conjunctiva normal  Cardiovascular: S1 and S2 Present, no Murmur, Respiratory: increased respiratory effort, Bilateral Air entry present and bilateral  Crackles, no wheezes Abdomen: Bowel Sound present, Soft and no tenderness Extremities: trace Pedal edema Neurology: alert and oriented to time, place, and person affect appropriate. no new focal deficit Gait not checked due to patient safety concerns    Vitals:   09/19/20 1930 09/19/20 1940 09/19/20 2000 09/19/20 2100  BP: (!) 133/58  (!) 141/90 (!) 133/59  Pulse: 65 (!) 59 79 70  Resp: (!) 21 (!) 23 20 15   Temp:      TempSrc:      SpO2: 94% 98% (!) 89% 97%  Weight:      Height:        Intake/Output Summary (Last 24 hours) at 09/19/2020 2103 Last data filed at 09/19/2020 1900 Gross per 24 hour  Intake 540 ml  Output 675 ml  Net -135 ml   Filed Weights   09/07/2020 2136 09/18/20 2330  Weight: 60.8 kg 62.7 kg    Data Reviewed: I have personally reviewed and interpreted daily labs, tele strips, imaging. I reviewed all nursing notes, pharmacy notes, vitals, pertinent old records I have discussed plan of care as described above with RN and patient/family.  CBC: Recent Labs  Lab 09/07/2020 2152 09/29/2020 2324 09/18/20 0100 09/18/20 0340 09/19/20 1015  WBC 12.3*  --  7.7 8.1 12.6*  NEUTROABS 8.9*  --   --   --  10.9*  HGB 15.0 14.6 13.7 13.7 15.3  HCT 47.7 43.0 43.6 41.0 47.1  MCV 102.4*  --  102.1* 100.7* 101.9*  PLT 126*  --  114* 107* 811*   Basic Metabolic Panel: Recent Labs  Lab October 08, 2020 2149 10-08-20 2152 10/08/20 2324 09/18/20 0100 09/18/20 0340 09/19/20 1015  NA 139 138 139  --  141 145  K 4.2 4.1 4.1  --  4.1 3.9  CL  --  99  --   --  103 100  CO2  --  26  --   --  27 34*  GLUCOSE  --  194*  --   --  119* 144*  BUN  --  26*  --   --  27* 46*  CREATININE  --  0.99  --  0.92 0.98 1.24  CALCIUM  --  8.7*  --   --  8.4* 8.5*  MG  --   --   --   --   --  2.2    Studies: No  results found.  Scheduled Meds: . aspirin EC  81 mg Oral Daily  . Chlorhexidine Gluconate Cloth  6 each Topical Q1200  . enoxaparin (LOVENOX) injection  40 mg Subcutaneous Q24H  . Ipratropium-Albuterol  1 puff Inhalation QID  . methylPREDNISolone (SOLU-MEDROL) injection  30 mg Intravenous BID  . senna-docusate  1 tablet Oral BID   Continuous Infusions: . remdesivir 100 mg in NS 100 mL Stopped (09/19/20 1028)   PRN Meds: polyethylene glycol  Time spent: 35 minutes  Author: Berle Mull, MD [facility] 09/19/2020 9:03 PM  To reach On-call, see care teams to locate the attending and reach out via [email or web address]. Between 7PM-7AM, please contact night-coverage If you still have difficulty reaching the attending provider, please page the [facility] (Director on Call) for [facility] on amion for assistance.

## 2020-09-19 NOTE — Progress Notes (Signed)
[place] Progress Note Patient Name: Timothy Vaughn DOB: 1929/06/05 MRN: [id number]   Date of Service  09/19/2020  HPI/Events of Note  Temp 96.9. Covid AHRF    eICU Interventions  Bear hugger ordered as per request from bed side. MAP > 65. HR 60.      Intervention Category Minor Interventions: Other:;Routine modifications to care plan (e.g. PRN medications for pain, fever)  Elmer Sow 09/19/2020, 12:43 AM

## 2020-09-19 NOTE — Progress Notes (Signed)
Assisted tele visit to patient with family member.  Timothy Elsea R, RN  

## 2020-09-20 ENCOUNTER — Inpatient Hospital Stay (HOSPITAL_COMMUNITY): Payer: Medicare Other

## 2020-09-20 DIAGNOSIS — J9602 Acute respiratory failure with hypercapnia: Secondary | ICD-10-CM | POA: Diagnosis not present

## 2020-09-20 DIAGNOSIS — J9601 Acute respiratory failure with hypoxia: Secondary | ICD-10-CM | POA: Diagnosis not present

## 2020-09-20 LAB — COMPREHENSIVE METABOLIC PANEL
ALT: 45 U/L — ABNORMAL HIGH (ref 0–44)
AST: 45 U/L — ABNORMAL HIGH (ref 15–41)
Albumin: 3.2 g/dL — ABNORMAL LOW (ref 3.5–5.0)
Alkaline Phosphatase: 56 U/L (ref 38–126)
Anion gap: 13 (ref 5–15)
BUN: 52 mg/dL — ABNORMAL HIGH (ref 8–23)
CO2: 29 mmol/L (ref 22–32)
Calcium: 8.1 mg/dL — ABNORMAL LOW (ref 8.9–10.3)
Chloride: 99 mmol/L (ref 98–111)
Creatinine, Ser: 1.13 mg/dL (ref 0.61–1.24)
GFR, Estimated: >60 mL/min (ref >60)
Glucose, Bld: 180 mg/dL — ABNORMAL HIGH (ref 70–99)
Potassium: 4.5 mmol/L (ref 3.5–5.1)
Sodium: 141 mmol/L (ref 135–145)
Total Bilirubin: 0.8 mg/dL (ref 0.3–1.2)
Total Protein: 6 g/dL — ABNORMAL LOW (ref 6.5–8.1)

## 2020-09-20 LAB — MAGNESIUM: Magnesium: 2.3 mg/dL (ref 1.7–2.4)

## 2020-09-20 LAB — CBC
HCT: 47.5 % (ref 39.0–52.0)
Hemoglobin: 15.3 g/dL (ref 13.0–17.0)
MCH: 32.5 pg (ref 26.0–34.0)
MCHC: 32.2 g/dL (ref 30.0–36.0)
MCV: 100.8 fL — ABNORMAL HIGH (ref 80.0–100.0)
Platelets: UNDETERMINED 10*3/uL (ref 150–400)
RBC: 4.71 MIL/uL (ref 4.22–5.81)
RDW: 12.5 % (ref 11.5–15.5)
WBC: 9 10*3/uL (ref 4.0–10.5)
nRBC: 0 % (ref 0.0–0.2)

## 2020-09-20 LAB — D-DIMER, QUANTITATIVE: D-Dimer, Quant: 1.34 ug/mL-FEU — ABNORMAL HIGH (ref 0.00–0.50)

## 2020-09-20 MED ORDER — BENZONATATE 100 MG PO CAPS
200.0000 mg | ORAL_CAPSULE | Freq: Two times a day (BID) | ORAL | Status: DC
  Administered 2020-09-20 – 2020-09-22 (×5): 200 mg via ORAL
  Filled 2020-09-20 (×5): qty 2

## 2020-09-20 MED ORDER — ONDANSETRON HCL 4 MG/2ML IJ SOLN: 4.0000 mg | Freq: Four times a day (QID) | INTRAMUSCULAR | Status: DC | PRN

## 2020-09-20 MED ORDER — GUAIFENESIN-DM 100-10 MG/5ML PO SYRP
10.0000 mL | ORAL_SOLUTION | Freq: Four times a day (QID) | ORAL | Status: DC
  Administered 2020-09-20 – 2020-09-22 (×9): 10 mL via ORAL
  Filled 2020-09-20 (×8): qty 10

## 2020-09-20 MED ORDER — HYDROCOD POLST-CPM POLST ER 10-8 MG/5ML PO SUER: 5.0000 mL | Freq: Two times a day (BID) | ORAL | Status: DC | PRN

## 2020-09-20 MED ORDER — ACETAMINOPHEN 325 MG PO TABS: 650.0000 mg | ORAL_TABLET | Freq: Four times a day (QID) | ORAL | Status: DC | PRN

## 2020-09-20 NOTE — Progress Notes (Signed)
[facility] Progress Note  Patient: Timothy Vaughn    MPN:[id number]  DOA: 09/21/2020     Date of Service: the patient was seen and examined on 09/20/2020  Brief hospital course: Past medical history of kyphoscoliosis with chronic hypercapnia noncompliant with BiPAP therapy at home.  Presents with complaints of shortness of breath and cough as well as confusion.  Found to have acute hypercarbic respiratory failure needing BiPAP. Currently plan is continue supportive care for hypoxia  Assessment and Plan: Acute on chronic hypoxemic and hypercapnic respiratory failure secondary to COVID-19 infection Patient has restrictive lung disease secondary to kyphoscoliosis Treated with BiPAP therapy. Noncompliant with BiPAP therapy at home.  Patient would benefit from outpatient follow-up with pulmonary for consideration of PAP therapy at home. Currently comfortable on nasal cannula. Spo2 goal >90%  Complete course of remdesivir that was started already  Continue methylpredisolone  Cough regimen adjusted. Patient reports congestion and therefore chest PT initiated.  Acute encephalopathy, resolved  was likely related to hypercapenia   HLD Continue aspirin   HTN Holding lasix   Unwitnessed fall. On 1/17 night 1/18 early morning patient had an unwitnessed fall when he was trying to get out of the bed and go to the bathroom on his own without calling for help. Denies having any head injury or neck pain right now. Mild injury to left elbow x-ray negative for any acute fracture.  Body mass index is 24.53 kg/m.   Diet: Regular diet DVT Prophylaxis:   enoxaparin (LOVENOX) injection 40 mg Start: 09/19/20 1100 SCDs Start: 09/25/2020 2351    Advance goals of care discussion: DNR  Family Communication: no family was present at bedside, at the time of interview.  Discussed with son on the phone.  Disposition:  Status is: Inpatient  Remains inpatient appropriate because:IV  treatments appropriate due to intensity of illness or inability to take PO and Inpatient level of care appropriate due to severity of illness   Dispo: The patient is from: Home              Anticipated d/c is to: SNF              Anticipated d/c date is: 3 days              Patient currently is not medically stable to d/c.  Subjective: Unwitnessed fall with injury to his left elbow.  No nausea no vomiting.  No confusion right now.  No chest pain.  Physical Exam:  General: Appear in mild distress, no Rash; Oral Mucosa Clear, moist. no Abnormal Neck Mass Or lumps, Conjunctiva normal  Cardiovascular: S1 and S2 Present, no Murmur, Respiratory: increased respiratory effort, Bilateral Air entry present and bilateral  Crackles, no wheezes Abdomen: Bowel Sound present, Soft and no tenderness Extremities: trace Pedal edema Neurology: alert and oriented to time, place, and person affect appropriate. no new focal deficit Gait not checked due to patient safety concerns   Vitals:   09/20/20 1600 09/20/20 1800 09/20/20 1900 09/20/20 2000  BP: 138/81   (!) 157/68  Pulse: 93 90 78 91  Resp: (!) 26 (!) 26 (!) 22 (!) 25  Temp: 98.7 F (37.1 C)   98.2 F (36.8 C)  TempSrc: Oral   Axillary  SpO2: 93% 95% 100% 94%  Weight:      Height:        Intake/Output Summary (Last 24 hours) at 09/20/2020 2043 Last data filed at 09/20/2020 2000 Gross per 24 hour  Intake 565  ml  Output 1275 ml  Net -710 ml   Filed Weights   09/20/20 0020  Weight: 62.8 kg    Data Reviewed: I have personally reviewed and interpreted daily labs, tele strips, imaging. I reviewed all nursing notes, pharmacy notes, vitals, pertinent old records I have discussed plan of care as described above with RN and patient/family.  CBC: Recent Labs  Lab 09/21/2020 2152 09/04/2020 2324 09/18/20 0100 09/18/20 0340 09/19/20 1015 09/20/20 0301  WBC 12.3*  --  7.7 8.1 12.6* 9.0  NEUTROABS 8.9*  --   --   --  10.9*  --   HGB 15.0  14.6 13.7 13.7 15.3 15.3  HCT 47.7 43.0 43.6 41.0 47.1 47.5  MCV 102.4*  --  102.1* 100.7* 101.9* 100.8*  PLT 126*  --  114* 107* 120* PLATELET CLUMPS NOTED ON SMEAR, UNABLE TO ESTIMATE   Basic Metabolic Panel: Recent Labs  Lab 09/12/2020 2152 09/11/2020 2324 09/18/20 0100 09/18/20 0340 09/19/20 1015 09/20/20 0301  NA 138 139  --  141 145 141  K 4.1 4.1  --  4.1 3.9 4.5  CL 99  --   --  103 100 99  CO2 26  --   --  27 34* 29  GLUCOSE 194*  --   --  119* 144* 180*  BUN 26*  --   --  27* 46* 52*  CREATININE 0.99  --  0.92 0.98 1.24 1.13  CALCIUM 8.7*  --   --  8.4* 8.5* 8.1*  MG  --   --   --   --  2.2 2.3    Studies: DG Elbow 2 Views Left  Result Date: 09/20/2020 CLINICAL DATA:  Fall last night with abrasion to the posterior aspect of the left elbow. COVID positive. EXAM: LEFT ELBOW - 2 VIEW COMPARISON:  None. FINDINGS: There is an apparent soft tissue laceration about the posterior aspect of the distal humerus without associated fracture or elbow joint effusion. Joint spaces appear preserved. A peripheral IV is seen within the antecubital fossa. No radiopaque foreign body. IMPRESSION: Soft tissue laceration about the posterior aspect of the distal humerus without associated fracture or elbow joint effusion. Electronically Signed   By: Sandi Mariscal M.D.   On: 09/20/2020 08:46    Scheduled Meds: . aspirin EC  81 mg Oral Daily  . benzonatate  200 mg Oral BID  . Chlorhexidine Gluconate Cloth  6 each Topical Q1200  . enoxaparin (LOVENOX) injection  40 mg Subcutaneous Q24H  . guaiFENesin-dextromethorphan  10 mL Oral QID  . Ipratropium-Albuterol  1 puff Inhalation QID  . methylPREDNISolone (SOLU-MEDROL) injection  30 mg Intravenous BID  . senna-docusate  1 tablet Oral BID   Continuous Infusions: . remdesivir 100 mg in NS 100 mL 100 mg (09/20/20 0800)   PRN Meds: acetaminophen, chlorpheniramine-HYDROcodone, ondansetron (ZOFRAN) IV, polyethylene glycol  Time spent: 35  minutes  Author: Berle Mull, MD [facility] 09/20/2020 8:43 PM  To reach On-call, see care teams to locate the attending and reach out via [email or web address]. Between 7PM-7AM, please contact night-coverage If you still have difficulty reaching the attending provider, please page the [facility] (Director on Call) for [facility] on amion for assistance.

## 2020-09-20 NOTE — Progress Notes (Signed)
09/20/20 0235  What Happened  Was fall witnessed? No  Was patient injured? Yes  Patient found other (Comment) (patient found standing next to bed)  Found by No one-pt stated they fell  Stated prior activity bathroom-unassisted (however, patient had primofit on)  Follow Up  MD notified Elink  Time MD notified 38  Family notified No - patient refusal  Additional tests No  Simple treatment Dressing  Progress note created (see row info) Yes  Adult Fall Risk Assessment  Risk Factor Category (scoring not indicated) Fall has occurred during this admission (document High fall risk)  Patient Fall Risk Level High fall risk  Adult Fall Risk Interventions  Required Bundle Interventions *See Row Information* High fall risk - low, moderate, and high requirements implemented  Additional Interventions Room near nurses station;Use of appropriate toileting equipment (bedpan, BSC, etc.)  Screening for Fall Injury Risk (To be completed on HIGH fall risk patients) - Assessing Need for Low Bed  Risk For Fall Injury- Low Bed Criteria 85 years or older  Will Implement Low Bed and Floor Mats Yes  Screening for Fall Injury Risk (To be completed on HIGH fall risk patients who do not meet crieteria for Low Bed) - Assessing Need for Floor Mats Only  Risk For Fall Injury- Criteria for Floor Mats None identified - No additional interventions needed  Vitals  BP Location Right Arm  BP Method Automatic  Patient Position (if appropriate) Lying  Pulse Rate 93  Pulse Rate Source Monitor  ECG Heart Rate 96  Cardiac Rhythm NSR  Resp (!) 28  Oxygen Therapy  SpO2 96 %  O2 Device HFNC  O2 Flow Rate (L/min) 4 L/min  Patient Activity (if Appropriate) In bed  Pulse Oximetry Type Continuous  Oximetry Probe Site Changed No  Pain Assessment  Pain Scale 0-10  Pain Score 0  Neurological  Neuro (WDL) X  Level of Consciousness Alert  Orientation Level Oriented X4  Cognition Follows commands  Speech Clear  Pupil  Assessment  Yes  R Pupil Size (mm) 5  R Pupil Shape Round  R Pupil Reaction Sluggish  L Pupil Size (mm) 2  L Pupil Shape Round  L Pupil Reaction Sluggish  Additional Pupil Assessments No  Motor Function/Sensation Assessment Grip;Motor response  Facial Symmetry Symmetrical  R Hand Grip Present;Moderate  L Hand Grip Present;Moderate   R Foot Dorsiflexion Present;Moderate  L Foot Dorsiflexion Present;Moderate  R Foot Plantar Flexion Present;Moderate  L Foot Plantar Flexion Present;Moderate  RUE Motor Response Purposeful movement;Responds to commands  LUE Motor Response Purposeful movement;Responds to commands  RLE Motor Response Purposeful movement;Responds to commands  LLE Motor Response Purposeful movement;Responds to commands  Neuro Symptoms None  Glasgow Coma Scale  Eye Opening 4  Best Verbal Response (NON-intubated) 5  Best Motor Response 6  Glasgow Coma Scale Score 15  Musculoskeletal  Musculoskeletal (WDL) X  Assistive Device None  Musculoskeletal Details  RUE Weakness  LUE Weakness  RLE Weakness  LLE Weakness  Integumentary  Integumentary (WDL) X  Skin Color Appropriate for ethnicity  Skin Condition Dry  Skin Integrity Abrasion;Ecchymosis  Abrasion Location Elbow  Abrasion Location Orientation Left  Abrasion Intervention Cleansed;Foam  Ecchymosis Location Leg;Elbow  Ecchymosis Location Orientation Bilateral  Ecchymosis Intervention Cleansed  Skin Turgor Non-tenting  Oxygen Therapy/Pulse Ox  $ High Flow Nasal Cannula  Yes (salter [place] in use)  Alarm Limits  SpO2 Alarm Limit High 100  Pain Assessment  Work-Related Injury No  Pain Screening  Clinical  Progression Not changed    At around 2:35am, I heard a bang from the patient's room. Upon assessment, the patient was standing next to the bed. I asked if he had fallen and he said yes, I need to go to the bathroom. I assisted patient back to bed. Patient was alert/ oriented x4, follows commands, appropriate at  baseline. He has a new abrasion on his left elbow. Patient stated no pain at this time. I cleansed and applied a foam dressing. Ehrhardt notified. MD wants to continue to monitor patient at this time. No new orders or tests. Patient requested to not inform family at this time.  Bed alarm placed, yellow non skid socks on patient, fall risk bracelet applied. Nursing will continue to monitor.           Note Details  Author Comer Locket, RN File Time 09/20/2020 4:01 AM  Author Type Registered Nurse Status Incomplete  Last Editor Comer Locket, RN Service (none)  [place] # [id number] Admit Date 09/16/2020

## 2020-09-21 DIAGNOSIS — J9601 Acute respiratory failure with hypoxia: Secondary | ICD-10-CM | POA: Diagnosis not present

## 2020-09-21 DIAGNOSIS — I10 Essential (primary) hypertension: Secondary | ICD-10-CM | POA: Diagnosis not present

## 2020-09-21 DIAGNOSIS — U071 COVID-19: Secondary | ICD-10-CM | POA: Diagnosis not present

## 2020-09-21 LAB — CBC
HCT: 42.4 % (ref 39.0–52.0)
Hemoglobin: 13.9 g/dL (ref 13.0–17.0)
MCH: 33.4 pg (ref 26.0–34.0)
MCHC: 32.8 g/dL (ref 30.0–36.0)
MCV: 101.9 fL — ABNORMAL HIGH (ref 80.0–100.0)
Platelets: 120 10*3/uL — ABNORMAL LOW (ref 150–400)
RBC: 4.16 MIL/uL — ABNORMAL LOW (ref 4.22–5.81)
RDW: 12.4 % (ref 11.5–15.5)
WBC: 8.9 10*3/uL (ref 4.0–10.5)
nRBC: 0 % (ref 0.0–0.2)

## 2020-09-21 LAB — COMPREHENSIVE METABOLIC PANEL
ALT: 42 U/L (ref 0–44)
AST: 28 U/L (ref 15–41)
Albumin: 3 g/dL — ABNORMAL LOW (ref 3.5–5.0)
Alkaline Phosphatase: 53 U/L (ref 38–126)
Anion gap: 7 (ref 5–15)
BUN: 34 mg/dL — ABNORMAL HIGH (ref 8–23)
CO2: 39 mmol/L — ABNORMAL HIGH (ref 22–32)
Calcium: 8.3 mg/dL — ABNORMAL LOW (ref 8.9–10.3)
Chloride: 100 mmol/L (ref 98–111)
Creatinine, Ser: 0.8 mg/dL (ref 0.61–1.24)
GFR, Estimated: >60 mL/min (ref >60)
Glucose, Bld: 172 mg/dL — ABNORMAL HIGH (ref 70–99)
Potassium: 4.9 mmol/L (ref 3.5–5.1)
Sodium: 146 mmol/L — ABNORMAL HIGH (ref 135–145)
Total Bilirubin: 0.8 mg/dL (ref 0.3–1.2)
Total Protein: 5.8 g/dL — ABNORMAL LOW (ref 6.5–8.1)

## 2020-09-21 LAB — C-REACTIVE PROTEIN: CRP: 5.3 mg/dL — ABNORMAL HIGH (ref <1.0)

## 2020-09-21 LAB — D-DIMER, QUANTITATIVE: D-Dimer, Quant: 0.63 ug/mL-FEU — ABNORMAL HIGH (ref 0.00–0.50)

## 2020-09-21 LAB — MAGNESIUM: Magnesium: 2.1 mg/dL (ref 1.7–2.4)

## 2020-09-21 MED ORDER — FAMOTIDINE 20 MG PO TABS
20.0000 mg | ORAL_TABLET | Freq: Every day | ORAL | Status: DC
  Administered 2020-09-21 – 2020-09-22 (×2): 20 mg via ORAL
  Filled 2020-09-21 (×2): qty 1

## 2020-09-21 MED ORDER — MOMETASONE FURO-FORMOTEROL FUM 100-5 MCG/ACT IN AERO
2.0000 | INHALATION_SPRAY | Freq: Two times a day (BID) | RESPIRATORY_TRACT | Status: DC
  Administered 2020-09-22: 2 via RESPIRATORY_TRACT
  Filled 2020-09-21 (×2): qty 8.8

## 2020-09-21 NOTE — Progress Notes (Signed)
Attempted to call report yo 57W. Will call back in 15 mins.

## 2020-09-21 NOTE — Evaluation (Signed)
Physical Therapy Evaluation Patient Details Name: Timothy Vaughn MRN: [id number] DOB: Apr 22, 1929 Today's Date: 09/21/2020   History of Present Illness  Pt adm with acute on chronic hypoxemic and hypercapnic respiratory failure secondary to COVID-19 infection. Pt initially with acute encephalopathy. Pt with in hospital fall early 1/18 with mild injury to lt elbow. PMH - restrictive lung disease due to kyphoscoliosis, polio, HTN,  Clinical Impression  Pt presents to PT with decr mobility and functional activity tolerance. Needs skilled PT to maximize indep and safety prior to eventual return home. 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 Currently wife is staying at [facility]. Feel currently pt will need 24 hour assist and could benefit from ST-SNF to reach an independent level prior to returning home.     Follow Up Recommendations SNF    Equipment Recommendations  Other (comment) (rollator)    Recommendations for Other Services       Precautions / Restrictions Precautions Precautions: Fall      Mobility  Bed Mobility               General bed mobility comments: Pt up in recliner    Transfers Overall transfer level: Needs assistance Equipment used: Rolling walker (2 wheeled) Transfers: Sit to/from Stand Sit to Stand: Min assist         General transfer comment: assist to bring hips up and for balance  Ambulation/Gait Ambulation/Gait assistance: Min assist Gait Distance (Feet): 90 Feet Assistive device: Rolling walker (2 wheeled) Gait Pattern/deviations: Step-through pattern;Decreased stride length;Trunk flexed;Narrow base of support Gait velocity: decr Gait velocity interpretation: <1.31 ft/sec, indicative of household ambulator General Gait Details: Assist for balance and support  Stairs            Wheelchair Mobility    Modified Rankin (Stroke Patients Only)       Balance Overall balance assessment:  Needs assistance Sitting-balance support: No upper extremity supported;Feet supported Sitting balance-Leahy Scale: Good     Standing balance support: Bilateral upper extremity supported Standing balance-Leahy Scale: Poor Standing balance comment: Walker and min guard for static standing                             Pertinent Vitals/Pain Pain Assessment: No/denies pain    Home Living Family/patient expects to be discharged to:: Private residence Living Arrangements: Spouse/significant other (wife had CVA and pt is the caregiver for her) Available Help at Discharge: Family;Available PRN/intermittently Type of Home: House Home Access: Ramped entrance     Home Layout: One level Home Equipment: None Additional Comments: Uses home O2 at night time only    Prior Function Level of Independence: Independent               Hand Dominance        Extremity/Trunk Assessment   Upper Extremity Assessment Upper Extremity Assessment: Defer to OT evaluation    Lower Extremity Assessment Lower Extremity Assessment: Generalized weakness       Communication   Communication: HOH  Cognition Arousal/Alertness: Awake/alert Behavior During Therapy: WFL for tasks assessed/performed Overall Cognitive Status: Impaired/Different from baseline Area of Impairment: Safety/judgement                         Safety/Judgement: Decreased awareness of safety     General Comments: Pt with in hospital fall      General Comments General  comments (skin integrity, edema, etc.): Pt on 4L O2 with SpO2 >93% throughout    Exercises     Assessment/Plan    PT Assessment Patient needs continued PT services  PT Problem List Decreased strength;Decreased activity tolerance;Decreased balance;Decreased mobility;Decreased safety awareness       PT Treatment Interventions DME instruction;Gait training;Functional mobility training;Therapeutic activities;Therapeutic  exercise;Balance training;Patient/family education    PT Goals (Current goals can be found in the Care Plan section)  Acute Rehab PT Goals Patient Stated Goal: get better PT Goal Formulation: With patient Time For Goal Achievement: 10/05/20 Potential to Achieve Goals: Good    Frequency Min 3X/week   Barriers to discharge Decreased caregiver support Pt is primary caregiver for wife    Co-evaluation               AM-PAC PT "6 Clicks" Mobility  Outcome Measure Help needed turning from your back to your side while in a flat bed without using bedrails?: A Little Help needed moving from lying on your back to sitting on the side of a flat bed without using bedrails?: A Little Help needed moving to and from a bed to a chair (including a wheelchair)?: A Little Help needed standing up from a chair using your arms (e.g., wheelchair or bedside chair)?: A Little Help needed to walk in hospital room?: A Little Help needed climbing 3-5 steps with a railing? : A Little 6 Click Score: 18    End of Session Equipment Utilized During Treatment: Oxygen Activity Tolerance: Patient tolerated treatment well Patient left: in chair;with call bell/phone within reach;with chair alarm set Nurse Communication: Mobility status PT Visit Diagnosis: Unsteadiness on feet (R26.81);Muscle weakness (generalized) (M62.81);History of falling (Z91.81)    Time: 7035-0093 PT Time Calculation (min) (ACUTE ONLY): 33 min   Charges:   PT Evaluation $PT Eval Moderate Complexity: [place] Pager [phone] Office [place] 09/21/2020, 12:00 PM

## 2020-09-21 NOTE — Progress Notes (Signed)
Patient transferred to [phone]. Connected to telemetry and repositioned in the bed. Thayer Ohm D

## 2020-09-21 NOTE — Evaluation (Signed)
Clinical/Bedside Swallow Evaluation Patient Details  Name: Timothy Vaughn MRN: [id number] Date of Birth: 02-Oct-1928  Today's Date: 09/21/2020 Time: SLP Start Time (ACUTE ONLY): 1140 SLP Stop Time (ACUTE ONLY): 1153 SLP Time Calculation (min) (ACUTE ONLY): 13 min  Past Medical History:  Past Medical History:  Diagnosis Date  . Acute respiratory failure with hypoxia and hypercapnia ([place]) 09/07/2015  . AKI (acute kidney injury) ([place]) 09/08/2015  . Cancer [facility])    prostate  . CAP (community acquired pneumonia) 09/07/2015  . Hyperlipidemia   . Hypertension   . Pre-diabetes   . Vitamin D deficiency    Past Surgical History:  Past Surgical History:  Procedure Laterality Date  . PROSTATECTOMY  2001   HPI:  Pt adm with acute on chronic hypoxemic and hypercapnic respiratory failure secondary to COVID-19 infection. Pt initially with acute encephalopathy. Pt with in hospital fall early 1/18 with mild injury to lt elbow. PMH - restrictive lung disease due to kyphoscoliosis, polio, HTN,   Assessment / Plan / Recommendation Clinical Impression  Pt was eating lunch and sneezing repeatedly upon entering the room.  He participated in clinical swallow assessment, demonstrating consistent belching and occasional coughing throughout the exam. Oral mechanism was WNL.  He was coordinating cycles of breathing/swallowing without difficulty.  He reported no issues with swallowing. It was difficult to discern if coughing was attributable to a dysphagia, as it occurred regardless of consistency and intermittently whether he was taking in POs or not.  He did belch after all swallows of liquid, raising question of potential esophageal involvement.  For today, continue current diet of regular solids, thin liquids.  SLP will f/u to determine overall toleration and whether further instrumental study is warranted.  Pt agrees with plan. SLP Visit Diagnosis: Dysphagia, unspecified (R13.10)    Aspiration Risk    tba   Diet Recommendation   regular solids, thin liquids  Medication Administration: Whole meds with puree    Other  Recommendations Oral Care Recommendations: Oral care BID   Follow up Recommendations Other (comment) (tba)      Frequency and Duration min 2x/week  2 weeks       Prognosis        Swallow Study   General HPI: Pt adm with acute on chronic hypoxemic and hypercapnic respiratory failure secondary to COVID-19 infection. Pt initially with acute encephalopathy. Pt with in hospital fall early 1/18 with mild injury to lt elbow. PMH - restrictive lung disease due to kyphoscoliosis, polio, HTN, Type of Study: Bedside Swallow Evaluation Previous Swallow Assessment: no Diet Prior to this Study: Regular;Thin liquids Temperature Spikes Noted: No Respiratory Status: Nasal cannula (4L) History of Recent Intubation: No Behavior/Cognition: Alert;Cooperative;Pleasant mood Oral Cavity Assessment: Within Functional Limits Oral Care Completed by SLP: No Oral Cavity - Dentition: Adequate natural dentition Vision: Functional for self-feeding Self-Feeding Abilities: Able to feed self Patient Positioning: Upright in chair Baseline Vocal Quality: Wet Volitional Cough: Strong Volitional Swallow: Able to elicit    Oral/Motor/Sensory Function Overall Oral Motor/Sensory Function: Within functional limits   Ice Chips Ice chips: Within functional limits   Thin Liquid Thin Liquid: Impaired Presentation: Straw Pharyngeal  Phase Impairments: Cough - Delayed    Nectar Thick Nectar Thick Liquid: Not tested   Honey Thick Honey Thick Liquid: Not tested   Puree Puree: Impaired Pharyngeal Phase Impairments: Cough - Delayed   Solid     Solid: Within functional limits      Juan Quam Laurice 09/21/2020,1:25 PM  Tyshia Fenter L. Tatumn Corbridge,  MA CCC/SLP Acute Rehabilitation Services Office number [phone] Pager [phone] '

## 2020-09-21 NOTE — TOC Progression Note (Signed)
Transition of Care [facility]) - Progression Note    Patient Details  Name: Timothy Vaughn MRN: [id number] Date of Birth: [birth year]  Transition of Care [facility]) CM/SW [place], LCSW Phone Number: 09/21/2020, 3:27 PM  Clinical Narrative:    CSW received consult for possible SNF placement at time of discharge. CSW spoke with patient's son. Patient's son reported that they want what is best for patient. He expressed understanding of PT recommendation and is agreeable to SNF placement at time of discharge. Patient's son reported preference for [facility] but CSW explained they are not accepting COVID patients. CSW discussed insurance authorization process and provided Medicare SNF ratings list for COVID facilities: [facility], [facility], and maybe [facility]. Timothy Vaughn reported that patient will likely not want to go to SNF but he will research the facilities and talk with patient. Patient has a caregiver during the daytime. CSW will follow up with son tomorrow as requested.     Expected Discharge Plan: [place] Barriers to Discharge: Continued Medical Work up  Expected Discharge Plan and Services Expected Discharge Plan: [place] In-house Referral: Clinical Social Work Discharge Planning Services: CM Consult Post Acute Care Choice: [place] arrangements for the past 2 months: Single Family Home                                       Social Determinants of Health (SDOH) Interventions    Readmission Risk Interventions No flowsheet data found.

## 2020-09-21 NOTE — Progress Notes (Signed)
[facility] Progress Note  Patient: Timothy Vaughn    LZJ:[id number]  DOA: 09/09/2020     Date of Service: the patient was seen and examined on 09/21/2020  Brief hospital course: Past medical history of kyphoscoliosis with chronic hypercapnia noncompliant with BiPAP therapy at home.  Presents with complaints of shortness of breath and cough as well as confusion.  Found to have acute hypercarbic respiratory failure needing BiPAP.   Assessment and Plan: Acute on chronic hypoxemic and hypercapnic respiratory failure secondary to COVID-19 infection Patient has restrictive lung disease secondary to kyphoscoliosis Treated with BiPAP therapy. Noncompliant with BiPAP therapy at home.   Patient would benefit from outpatient follow-up with pulmonary for consideration of PAP therapy at home. Patient currently on 3 to 4 L of oxygen saturating in the mid to late 90s. Patient receiving 5-day course of remdesivir.   Remains on steroids. Did not receive Actemra or baricitinib.  Initiate inhaled steroids.  We will give him Pepcid as well.   Recent Labs  Lab 09/11/2020 2152 09/19/20 1015 09/20/20 0301 09/20/20 0825 09/21/20 0637  DDIMER 0.83* 2.02*  --  1.34* 0.63*  FERRITIN 95  --   --   --   --   CRP 0.8 14.0*  --   --  5.3*  ALT 25 47* 45*  --  42  PROCALCITON <0.10  --   --   --   --     Acute encephalopathy Likely secondary to respiratory failure and hypercapnia.  Now resolved.  No focal deficits.    Essential hypertension Monitor blood pressures closely.  Furosemide on hold.  Elevated blood pressure could be due to steroids  Unwitnessed fall. On 1/17 night 1/18 early morning patient had an unwitnessed fall when he was trying to get out of the bed and go to the bathroom on his own without calling for help. No injuries were noted on evaluation.  PT and OT.  SNF recommended.   DVT Prophylaxis:   enoxaparin (LOVENOX) injection 40 mg Start: 09/19/20 1100 SCDs Start: 09/30/2020  2351   Advance goals of care discussion: DNR Family Communication: We will update son later today.  Disposition: SNF recommended by PT and OT  Status is: Inpatient  Remains inpatient appropriate because:IV treatments appropriate due to intensity of illness or inability to take PO and Inpatient level of care appropriate due to severity of illness   Dispo: The patient is from: Home              Anticipated d/c is to: SNF              Anticipated d/c date is: 3 days              Patient currently is not medically stable to d/c.  Subjective: Patient mainly concerned about her code is full dose.  From a shortness of breath perspective he states that he is feeling better.  Continues to have a cough.  He is hard of hearing.  Physical Exam:  General appearance: Awake alert.  In no distress.  Distracted Resp: Noted to be mildly tachypneic.  Few crackles at the bases bilaterally.  No wheezing or rhonchi. Cardio: S1-S2 is normal regular.  No S3-S4.  No rubs murmurs or bruit GI: Abdomen is soft.  Nontender nondistended.  Bowel sounds are present normal.  No masses organomegaly Extremities: No edema.  Full range of motion of lower extremities. Neurologic: No focal neurological deficits.     Vitals:   09/21/20  0900 09/21/20 1000 09/21/20 1100 09/21/20 1215  BP: (!) 151/74 (!) 119/93 (!) 151/98 (!) 156/68  Pulse: 62 69 70 73  Resp: 20 16 19 20   Temp:    97.7 F (36.5 C)  TempSrc:    Oral  SpO2: 99% 100% 96% 99%  Weight:      Height:        Intake/Output Summary (Last 24 hours) at 09/21/2020 1334 Last data filed at 09/21/2020 1000 Gross per 24 hour  Intake 521.9 ml  Output 675 ml  Net -153.1 ml   Filed Weights   09/20/20 0020 09/21/20 0308  Weight: 62.8 kg 60.8 kg    Data Reviewed:  CBC: Recent Labs  Lab 09/29/2020 2152 09/20/2020 2324 09/18/20 0100 09/18/20 0340 09/19/20 1015 09/20/20 0301 09/21/20 0637  WBC 12.3*  --  7.7 8.1 12.6* 9.0 8.9  NEUTROABS 8.9*  --   --   --   10.9*  --   --   HGB 15.0   < > 13.7 13.7 15.3 15.3 13.9  HCT 47.7   < > 43.6 41.0 47.1 47.5 42.4  MCV 102.4*  --  102.1* 100.7* 101.9* 100.8* 101.9*  PLT 126*  --  114* 107* 120* PLATELET CLUMPS NOTED ON SMEAR, UNABLE TO ESTIMATE 120*   < > = values in this interval not displayed.   Basic Metabolic Panel: Recent Labs  Lab 09/29/2020 2152 09/24/2020 2324 09/18/20 0100 09/18/20 0340 09/19/20 1015 09/20/20 0301 09/21/20 0637  NA 138 139  --  141 145 141 146*  K 4.1 4.1  --  4.1 3.9 4.5 4.9  CL 99  --   --  103 100 99 100  CO2 26  --   --  27 34* 29 39*  GLUCOSE 194*  --   --  119* 144* 180* 172*  BUN 26*  --   --  27* 46* 52* 34*  CREATININE 0.99  --  0.92 0.98 1.24 1.13 0.80  CALCIUM 8.7*  --   --  8.4* 8.5* 8.1* 8.3*  MG  --   --   --   --  2.2 2.3 2.1    Studies: No results found.  Scheduled Meds: . aspirin EC  81 mg Oral Daily  . benzonatate  200 mg Oral BID  . Chlorhexidine Gluconate Cloth  6 each Topical Q1200  . enoxaparin (LOVENOX) injection  40 mg Subcutaneous Q24H  . guaiFENesin-dextromethorphan  10 mL Oral QID  . Ipratropium-Albuterol  1 puff Inhalation QID  . methylPREDNISolone (SOLU-MEDROL) injection  30 mg Intravenous BID  . senna-docusate  1 tablet Oral BID   Continuous Infusions: . remdesivir 100 mg in NS 100 mL Stopped (09/21/20 0945)   PRN Meds: acetaminophen, chlorpheniramine-HYDROcodone, ondansetron (ZOFRAN) IV, polyethylene glycol    Author: Bonnielee Haff  [facility] 09/21/2020 1:34 PM  To reach On-call, see care teams to locate the attending and reach out via [email or web address]. Between 7PM-7AM, please contact night-coverage If you still have difficulty reaching the attending provider, please page the [facility] (Director on Call) for [facility] on amion for assistance.

## 2020-09-22 ENCOUNTER — Encounter (HOSPITAL_COMMUNITY): Payer: Self-pay | Admitting: Internal Medicine

## 2020-09-22 DIAGNOSIS — J9601 Acute respiratory failure with hypoxia: Secondary | ICD-10-CM | POA: Diagnosis not present

## 2020-09-22 DIAGNOSIS — I10 Essential (primary) hypertension: Secondary | ICD-10-CM | POA: Diagnosis not present

## 2020-09-22 DIAGNOSIS — E875 Hyperkalemia: Secondary | ICD-10-CM | POA: Diagnosis not present

## 2020-09-22 DIAGNOSIS — U071 COVID-19: Secondary | ICD-10-CM | POA: Diagnosis not present

## 2020-09-22 LAB — GLUCOSE, CAPILLARY: Glucose-Capillary: 155 mg/dL — ABNORMAL HIGH (ref 70–99)

## 2020-09-22 LAB — COMPREHENSIVE METABOLIC PANEL
ALT: 50 U/L — ABNORMAL HIGH (ref 0–44)
AST: 32 U/L (ref 15–41)
Albumin: 3.1 g/dL — ABNORMAL LOW (ref 3.5–5.0)
Alkaline Phosphatase: 62 U/L (ref 38–126)
Anion gap: 8 (ref 5–15)
BUN: 33 mg/dL — ABNORMAL HIGH (ref 8–23)
CO2: 38 mmol/L — ABNORMAL HIGH (ref 22–32)
Calcium: 8.3 mg/dL — ABNORMAL LOW (ref 8.9–10.3)
Chloride: 100 mmol/L (ref 98–111)
Creatinine, Ser: 0.72 mg/dL (ref 0.61–1.24)
GFR, Estimated: >60 mL/min (ref >60)
Glucose, Bld: 169 mg/dL — ABNORMAL HIGH (ref 70–99)
Potassium: 5.4 mmol/L — ABNORMAL HIGH (ref 3.5–5.1)
Sodium: 146 mmol/L — ABNORMAL HIGH (ref 135–145)
Total Bilirubin: UNDETERMINED mg/dL (ref 0.3–1.2)
Total Protein: 5.7 g/dL — ABNORMAL LOW (ref 6.5–8.1)

## 2020-09-22 LAB — CBC
HCT: 44.1 % (ref 39.0–52.0)
Hemoglobin: 14.2 g/dL (ref 13.0–17.0)
MCH: 33.2 pg (ref 26.0–34.0)
MCHC: 32.2 g/dL (ref 30.0–36.0)
MCV: 103 fL — ABNORMAL HIGH (ref 80.0–100.0)
Platelets: 117 10*3/uL — ABNORMAL LOW (ref 150–400)
RBC: 4.28 MIL/uL (ref 4.22–5.81)
RDW: 12.4 % (ref 11.5–15.5)
WBC: 8.7 10*3/uL (ref 4.0–10.5)
nRBC: 0 % (ref 0.0–0.2)

## 2020-09-22 LAB — CULTURE, BLOOD (ROUTINE X 2)
Culture: NO GROWTH
Special Requests: ADEQUATE

## 2020-09-22 LAB — D-DIMER, QUANTITATIVE: D-Dimer, Quant: 0.5 ug/mL-FEU (ref 0.00–0.50)

## 2020-09-22 LAB — MAGNESIUM: Magnesium: UNDETERMINED mg/dL (ref 1.7–2.4)

## 2020-09-22 LAB — C-REACTIVE PROTEIN: CRP: 3.9 mg/dL — ABNORMAL HIGH (ref <1.0)

## 2020-09-22 MED ORDER — PREDNISONE 20 MG PO TABS: 40.0000 mg | ORAL_TABLET | Freq: Every day | ORAL | Status: DC

## 2020-09-22 MED ORDER — GUAIFENESIN ER 600 MG PO TB12
600.0000 mg | ORAL_TABLET | Freq: Two times a day (BID) | ORAL | Status: DC
  Administered 2020-09-22 (×2): 600 mg via ORAL
  Filled 2020-09-22: qty 1

## 2020-09-22 MED ORDER — SODIUM ZIRCONIUM CYCLOSILICATE 10 G PO PACK
10.0000 g | PACK | Freq: Once | ORAL | Status: AC
  Administered 2020-09-22: 10 g via ORAL
  Filled 2020-09-22: qty 1

## 2020-10-04 NOTE — Death Summary Note (Signed)
DEATH SUMMARY   Patient Details  Name: Timothy Vaughn MRN: [id number] DOB: 27-Jun-1929  Admission/Discharge Information   Admit Date:  09/26/2020  Date of Death: Date of Death: 2020/10/01  Time of Death: Time of Death: 12/14/2137  Length of Stay: 2022-12-17  Referring Physician: Unk Pinto, MD   Reason(s) for Hospitalization  Pneumonia due to COVID-19  Diagnoses  Preliminary cause of death: Pneumonia due to COVID-19  Secondary Diagnoses (including complications and co-morbidities):  Essential hypertension   Brief Hospital Course (including significant findings, care, treatment, and services provided and events leading to death)   Brief HPI: Past medical history of kyphoscoliosis with chronic hypercapnia noncompliant with BiPAP therapy at home.  Presents with complaints of shortness of breath and cough as well as confusion.  Found to have acute hypercarbic respiratory failure needing BiPAP.   Brief Hospital course:  Acute on chronic hypoxemic and hypercapnic respiratory failure secondary to COVID-19 infection Patient has restrictive lung disease secondary to kyphoscoliosis.  Patient was initially treated with BiPAP.  Apparently has BiPAP at home but is not very compliant.  Patient was stable on 3 to 4 L of oxygen by nasal cannula saturating in the mid to late 90s.  He received 5-day course of Remdesivir.  Did not get Actemra or baricitinib.  He was also on steroids.  Inflammatory markers showed improvement.  D-dimer also showed improvement.  Acute encephalopathy Likely secondary to respiratory failure and hypercapnia.    This had resolved.   Essential hypertension  Hyperkalemia Lokelma x1 was given.  Unwitnessed fall. On 1/17 night 1/18 early morning patient had an unwitnessed fall when he was trying to get out of the bed and go to the bathroom on his own without calling for help. No injuries were noted on evaluation.  PT and OT.  SNF recommended.  On the night of Oct 01, 2022  patient was noted to aspirate.  Suctioning was provided however patient quickly became unresponsive.  He was already DNR.  No resuscitative attempts were made due to DNR status.  Patient subsequently expired on 10-01-2020 at 9:39 PM.    Pertinent Labs and Studies  Significant Diagnostic Studies DG Elbow 2 Views Left  Result Date: 09/20/2020 CLINICAL DATA:  Fall last night with abrasion to the posterior aspect of the left elbow. COVID positive. EXAM: LEFT ELBOW - 2 VIEW COMPARISON:  None. FINDINGS: There is an apparent soft tissue laceration about the posterior aspect of the distal humerus without associated fracture or elbow joint effusion. Joint spaces appear preserved. A peripheral IV is seen within the antecubital fossa. No radiopaque foreign body. IMPRESSION: Soft tissue laceration about the posterior aspect of the distal humerus without associated fracture or elbow joint effusion. Electronically Signed   By: Sandi Mariscal M.D.   On: 09/20/2020 08:46   DG Chest Portable 1 View  Result Date: 09-26-2020 CLINICAL DATA:  Respiratory distress COVID positive EXAM: PORTABLE CHEST 1 VIEW COMPARISON:  September 08, 2015 FINDINGS: The heart size and mediastinal contours are within normal limits. Mildly hazy airspace opacity seen at the right lung base. The visualized skeletal structures are unchanged with advanced scoliosis. IMPRESSION: Mildly hazy airspace opacity at the right lung base which could be due to atelectasis and/or early infectious etiology. Electronically Signed   By: Prudencio Pair M.D.   On: September 26, 2020 22:04    Microbiology Recent Results (from the past 240 hour(s))  Resp Panel by RT-PCR (Flu A&B, Covid) Nasopharyngeal Swab     Status: Abnormal   Collection Time:  09/05/2020  9:45 PM   Specimen: Nasopharyngeal Swab; Nasopharyngeal(NP) swabs in vial transport medium  Result Value Ref Range Status   SARS Coronavirus 2 by RT PCR POSITIVE (A) NEGATIVE Final    Comment: RESULT CALLED TO, READ BACK BY  AND VERIFIED WITH: M. BROOKS,RN 2254 09/21/2020 T. TYSOR (NOTE) SARS-CoV-2 target nucleic acids are DETECTED.  The SARS-CoV-2 RNA is generally detectable in upper respiratory specimens during the acute phase of infection. Positive results are indicative of the presence of the identified virus, but do not rule out bacterial infection or co-infection with other pathogens not detected by the test. Clinical correlation with patient history and other diagnostic information is necessary to determine patient infection status. The expected result is Negative.  Fact Sheet for Patients: BloggerCourse.com  Fact Sheet for Healthcare Providers: SeriousBroker.it  This test is not yet approved or cleared by the [place] FDA and  has been authorized for detection and/or diagnosis of SARS-CoV-2 by FDA under an Emergency Use Authorization (EUA).  This EUA will remain in effect (meaning this test ca n be used) for the duration of  the COVID-19 declaration under Section 564(b)(1) of the Act, 21 U.S.C. section 360bbb-3(b)(1), unless the authorization is terminated or revoked sooner.     Influenza A by PCR NEGATIVE NEGATIVE Final   Influenza B by PCR NEGATIVE NEGATIVE Final    Comment: (NOTE) The Xpert Xpress SARS-CoV-2/FLU/RSV plus assay is intended as an aid in the diagnosis of influenza from Nasopharyngeal swab specimens and should not be used as a sole basis for treatment. Nasal washings and aspirates are unacceptable for Xpert Xpress SARS-CoV-2/FLU/RSV testing.  Fact Sheet for Patients: BloggerCourse.com  Fact Sheet for Healthcare Providers: SeriousBroker.it  This test is not yet approved or cleared by the [place] FDA and has been authorized for detection and/or diagnosis of SARS-CoV-2 by FDA under an Emergency Use Authorization (EUA). This EUA will remain in effect (meaning this  test can be used) for the duration of the COVID-19 declaration under Section 564(b)(1) of the Act, 21 U.S.C. section 360bbb-3(b)(1), unless the authorization is terminated or revoked.  Performed at [facility], [address] [place], [place] [address]   Blood Culture (routine x 2)     Status: None   Collection Time: 09/18/2020  9:52 PM   Specimen: BLOOD  Result Value Ref Range Status   Specimen Description BLOOD SITE NOT SPECIFIED  Final   Special Requests   Final    BOTTLES DRAWN AEROBIC AND ANAEROBIC Blood Culture adequate volume   Culture   Final    NO GROWTH 5 DAYS Performed at [facility], [address], [place], [place] [address]    Report Status 10/03/2020 FINAL  Final  MRSA PCR Screening     Status: None   Collection Time: 09/18/20 11:16 PM   Specimen: Nasal Mucosa; Nasopharyngeal  Result Value Ref Range Status   MRSA by PCR NEGATIVE NEGATIVE Final    Comment:        The GeneXpert MRSA Assay (FDA approved for NASAL specimens only), is one component of a comprehensive MRSA colonization surveillance program. It is not intended to diagnose MRSA infection nor to guide or monitor treatment for MRSA infections. Performed at [facility], [address], [place], [place] [address]     Lab Basic Metabolic Panel: Recent Labs  Lab 09/18/20 0340 09/19/20 1015 09/20/20 0301 09/21/20 0637 09/15/2020 0510  NA 141 145 141 146* 146*  K 4.1  3.9 4.5 4.9 5.4*  CL 103 100 99 100 100  CO2 27 34* 29 39* 38*  GLUCOSE 119* 144* 180* 172* 169*  BUN 27* 46* 52* 34* 33*  CREATININE 0.98 1.24 1.13 0.80 0.72  CALCIUM 8.4* 8.5* 8.1* 8.3* 8.3*  MG  --  2.2 2.3 2.1 QUANTITY NOT SUFFICIENT, UNABLE TO PERFORM TEST   Liver Function Tests: Recent Labs  Lab 10/03/2020 2152 09/19/20 1015 09/20/20 0301 09/21/20 0637 Oct 22, 2020 0510  AST 39 42* 45* 28 32  ALT 25 47* 45* 42 50*  ALKPHOS 53 61 56 53 62  BILITOT 1.0 0.7 0.8 0.8 QUANTITY NOT SUFFICIENT, UNABLE TO  PERFORM TEST  PROT 6.9 6.5 6.0* 5.8* 5.7*  ALBUMIN 4.0 3.4* 3.2* 3.0* 3.1*   CBC: Recent Labs  Lab 09/16/2020 2152 09/20/2020 2324 09/18/20 0340 09/19/20 1015 09/20/20 0301 09/21/20 0637 October 22, 2020 0510  WBC 12.3*   < > 8.1 12.6* 9.0 8.9 8.7  NEUTROABS 8.9*  --   --  10.9*  --   --   --   HGB 15.0   < > 13.7 15.3 15.3 13.9 14.2  HCT 47.7   < > 41.0 47.1 47.5 42.4 44.1  MCV 102.4*   < > 100.7* 101.9* 100.8* 101.9* 103.0*  PLT 126*   < > 107* 120* PLATELET CLUMPS NOTED ON SMEAR, UNABLE TO ESTIMATE 120* 117*   < > = values in this interval not displayed.   Sepsis Labs: Recent Labs  Lab 09/20/2020 2152 10/02/2020 2351 09/18/20 0100 09/19/20 1015 09/20/20 0301 09/21/20 0637 10/22/20 0510  PROCALCITON <0.10  --   --   --   --   --   --   WBC 12.3*  --    < > 12.6* 9.0 8.9 8.7  LATICACIDVEN 1.1 0.7  --   --   --   --   --    < > = values in this interval not displayed.     Bonnielee Haff 10/02/2020, 6:45 AM

## 2020-10-04 NOTE — TOC Progression Note (Signed)
Transition of Care [facility]) - Progression Note    Patient Details  Name: Timothy Vaughn MRN: [id number] Date of Birth: 06-16-1929  Transition of Care [facility]) CM/SW [place], LCSW Phone Number: 09/05/2020, 2:18 PM  Clinical Narrative:    CSW spoke with patient's son and provided bed offer of [place].   Expected Discharge Plan: [place] Barriers to Discharge: Continued Medical Work up  Expected Discharge Plan and Services Expected Discharge Plan: [place] In-house Referral: Clinical Social Work Discharge Planning Services: CM Consult Post Acute Care Choice: [place] arrangements for the past 2 months: Single Family Home                                       Social Determinants of Health (SDOH) Interventions    Readmission Risk Interventions No flowsheet data found.

## 2020-10-04 NOTE — Progress Notes (Signed)
  Speech Language Pathology Treatment: Dysphagia  Patient Details Name: ROMAINE NEVILLE MRN: [id number] DOB: 1928-09-22 Today's Date: 09/24/2020 Time: 4098-1191 SLP Time Calculation (min) (ACUTE ONLY): 12 min  Assessment / Plan / Recommendation Clinical Impression  Pt was seen for dysphagia treatment and was cooperative during the session. [place], RN reported that the pt has been confused today and having extended conversations with no one on the phone. Pt was noted to turn on and off the room phone multiple times during the session and press numbers. When SLP offered help and inquired as to what he was attempting to do, he stated, "I don't know." Pt's RN denied any signs of aspiration with p.o. intake. Pt exhibited intermittent eructation following intake of thin liquids via straw and subsequent coughing. Coughing was also inconsistently observed with boluses of puree. Pt reported that he feels that his food "comes back up" and he ultimately refused additional boluses stating, "I better not have anymore, it's coming back up." Pt's SpO2 was 83-88% and RR 28-33 during the session. The impact of this on his performance is considered; however, the validity of these readings is questioned due to the nature of the pleth waveform. Pt presents with symptoms of both pharyngeal and esophageal dysphagia. A modified barium swallow study is recommended to further assess the oropharyngeal phase of the swallow.    HPI HPI: Pt adm with acute on chronic hypoxemic and hypercapnic respiratory failure secondary to COVID-19 infection. Pt initially with acute encephalopathy. Pt with in hospital fall early 1/18 with mild injury to lt elbow. PMH - restrictive lung disease due to kyphoscoliosis, polio, HTN,      SLP Plan  MBS       Recommendations  Diet recommendations: Regular;Thin liquid (Continue current diet) Liquids provided via: Cup;Straw Medication Administration: Whole meds with puree Supervision: Staff to  assist with self feeding Compensations: Slow rate;Small sips/bites                Oral Care Recommendations: Oral care BID Follow up Recommendations: Other (comment) (tba) SLP Visit Diagnosis: Dysphagia, unspecified (R13.10) Plan: MBS       Kita Neace I. Hardin Negus, [place], [place] Office number [phone] Pager [place] 09/05/2020, 5:51 PM

## 2020-10-04 NOTE — Progress Notes (Signed)
Paged at 9:35pm that patient was choking. Saw page and returned call at 9:39pm to find out that patient had expired. RN reports that she gave patient medications and a sip of water. He started choking, and she paged Rapid, Respiratory and myself. She reports that patient started having agonal breathing, and that by the time Rapid had arrived at bedside he had expired. I have reviewed the chart - patient was DNR. He was evaluated for dysphagia and thin liquids, regular diet were recommended. I have advised nurse that we are using electronic death certs.   I have spoken to the rapid nurse who reports that patient had started choking on water at 21:20. They deep suctioned, percussed his back, and put the head of his bed at 90 degrees before Rapid RN arrived at bedside. When she arrived, patient was in PEA and not breathing. She also confirmed that patient was DNR. Shortly after he was pronounced. Please see rapid response RN note for further details.

## 2020-10-04 NOTE — Progress Notes (Signed)
2120 Patient given sip of water tolerated well.Patient was given bedtime medications tolerated well no coughing.Patient took another sip of water and started coughing and oxygen saturations started dropping deep suction performed rapid response nurse,respiratory therapy ,and MD called.Patient stopped breathing.Rapid response nurse NT suctioned patient .Patient expired at 2139.Dr.Zierle-Ghosh notified.

## 2020-10-04 NOTE — NC FL2 (Signed)
[place] LEVEL OF CARE SCREENING TOOL     IDENTIFICATION  Patient Name: Timothy Vaughn Birthdate: 04/12/1929 Sex: male Admission Date (Current Location): 09/27/2020  [facility] and [place] Number:  Herbalist and Address:  [facility], [place] [address], [place], [place] [address]      Provider Number: [id number]  Attending Physician Name and Address:  Bonnielee Haff, MD  Relative Name and Phone Number:  Lanny Hurst, son , [phone]    Current Level of Care: Hospital Recommended Level of Care: [place] Prior Approval Number:    Date Approved/Denied:   PASRR Number: [id number]  Discharge Plan: SNF    Current Diagnoses: Patient Active Problem List   Diagnosis Date Noted  . Acute hypoxemic respiratory failure due to COVID-19 ([place]) 09/20/2020  . Moderate tricuspid regurgitation 09/08/2015  . Nocturnal hypoxia 09/06/2015  . Encounter for Medicare annual wellness exam 04/12/2015  . Medication management 02/10/2014  . Essential hypertension 08/14/2013  . Hyperlipidemia 08/14/2013  . Other abnormal glucose 08/14/2013  . Vitamin D deficiency 08/14/2013  . Kyphoscoliosis 08/14/2013  . Restrictive lung disease due to kyphoscoliosis 08/14/2013    Orientation RESPIRATION BLADDER Height & Weight     Self,Time,Situation,Place  O2 (Nasal cannula 4L) Incontinent,External catheter Weight: 134 lb 7.7 oz (61 kg) Height:  5\' 3"  (160 cm)  BEHAVIORAL SYMPTOMS/MOOD NEUROLOGICAL BOWEL NUTRITION STATUS      Continent Diet (Please see DC Summary)  AMBULATORY STATUS COMMUNICATION OF NEEDS Skin   Limited Assist Verbally Normal                       Personal Care Assistance Level of Assistance  Bathing,Feeding,Dressing Bathing Assistance: Limited assistance Feeding assistance: Independent Dressing Assistance: Limited assistance     Functional Limitations Info  Sight,Hearing Sight Info: Impaired Hearing Info:  Impaired (Hearing aid)      SPECIAL CARE FACTORS FREQUENCY  PT (By licensed PT),OT (By licensed OT)     PT Frequency: 5x/week OT Frequency: 5x/week            Contractures Contractures Info: Not present    Additional Factors Info  Code Status,Allergies,Isolation Precautions Code Status Info: DNR Allergies Info: Lipitor (Atorvastatin), Niaspan (Niacin Er)     Isolation Precautions Info: COVID+ 10/01/2020     Current Medications (09/12/2020):  This is the current hospital active medication list Current Facility-Administered Medications  Medication Dose Route Frequency Provider Last Rate Last Admin  . acetaminophen (TYLENOL) tablet 650 mg  650 mg Oral Q6H PRN Lavina Hamman, MD      . aspirin EC tablet 81 mg  81 mg Oral Daily Spero Geralds, MD   81 mg at 09/21/20 0912  . benzonatate (TESSALON) capsule 200 mg  200 mg Oral BID Lavina Hamman, MD   200 mg at 09/21/20 2143  . Chlorhexidine Gluconate Cloth 2 % PADS 6 each  6 each Topical Q1200 Lavina Hamman, MD   6 each at 09/21/20 2144  . chlorpheniramine-HYDROcodone (TUSSIONEX) 10-8 MG/5ML suspension 5 mL  5 mL Oral Q12H PRN Lavina Hamman, MD      . enoxaparin (LOVENOX) injection 40 mg  40 mg Subcutaneous Q24H Lavina Hamman, MD   40 mg at 09/21/20 1156  . famotidine (PEPCID) tablet 20 mg  20 mg Oral Daily Bonnielee Haff, MD   20 mg at 09/21/20 2145  . guaiFENesin-dextromethorphan (ROBITUSSIN DM) 100-10 MG/5ML syrup 10 mL  10 mL  Oral QID Lavina Hamman, MD   10 mL at 09/21/20 2142  . Ipratropium-Albuterol (COMBIVENT) respimat 1 puff  1 puff Inhalation QID Lavina Hamman, MD   1 puff at 09/21/20 2144  . methylPREDNISolone sodium succinate (SOLU-MEDROL) 40 mg/mL injection 30 mg  30 mg Intravenous BID Lavina Hamman, MD   30 mg at 09/21/20 2143  . mometasone-formoterol (DULERA) 100-5 MCG/ACT inhaler 2 puff  2 puff Inhalation BID Bonnielee Haff, MD      . ondansetron [facility]) injection 4 mg  4 mg Intravenous Q6H PRN Lavina Hamman, MD      . polyethylene glycol (MIRALAX / GLYCOLAX) packet 17 g  17 g Oral Daily PRN Spero Geralds, MD      . remdesivir 100 mg in sodium chloride 0.9 % 100 mL IVPB  100 mg Intravenous Daily Spero Geralds, MD   Stopped at 09/21/20 0945  . senna-docusate (Senokot-S) tablet 1 tablet  1 tablet Oral BID Lavina Hamman, MD   1 tablet at 09/21/20 2143  . sodium zirconium cyclosilicate (LOKELMA) packet 10 g  10 g Oral Once Bonnielee Haff, MD         Discharge Medications: Please see discharge summary for a list of discharge medications.  Relevant Imaging Results:  Relevant Lab Results:   Additional Information SSN: [id number]. Cass Amram Maya, LCSW

## 2020-10-04 NOTE — Progress Notes (Signed)
[facility] Progress Note  Patient: Timothy Vaughn    WUJ:[id number]  DOA: 09/19/2020     Date of Service: the patient was seen and examined on 09/09/2020  Brief hospital course: Past medical history of kyphoscoliosis with chronic hypercapnia noncompliant with BiPAP therapy at home.  Presents with complaints of shortness of breath and cough as well as confusion.  Found to have acute hypercarbic respiratory failure needing BiPAP.   Assessment and Plan: Acute on chronic hypoxemic and hypercapnic respiratory failure secondary to COVID-19 infection Patient has restrictive lung disease secondary to kyphoscoliosis Treated with BiPAP therapy. Noncompliant with BiPAP therapy at home.   Patient currently on 3 to 4 L of oxygen saturating in the mid to late 90s. Patient received 5-day course of remdesivir.   Remains on steroids. Did not receive Actemra or baricitinib.  Inhaled steroids and Pepcid initiated. Inflammatory markers have improved.  D-dimer is normal. Respiratory standpoint.  Requiring about 3 to 4 L of oxygen by nasal cannula.  Saturations noted in the early 90s.   Recent Labs  Lab 09-19-2020 2152 09/19/20 1015 09/20/20 0301 09/20/20 0825 09/21/20 0637 09/03/2020 0510  DDIMER 0.83* 2.02*  --  1.34* 0.63* 0.50  FERRITIN 95  --   --   --   --   --   CRP 0.8 14.0*  --   --  5.3* 3.9*  ALT 25 47* 45*  --  42 50*  PROCALCITON <0.10  --   --   --   --   --     Acute encephalopathy Likely secondary to respiratory failure and hypercapnia.  Now resolved.  No focal deficits.    Essential hypertension Furosemide is on hold.  Blood pressures are stable for the most part with occasional high readings.  Could be due to steroids.    Hyperkalemia Lokelma x1 ordered.  Recheck tomorrow  Unwitnessed fall. On 1/17 night 1/18 early morning patient had an unwitnessed fall when he was trying to get out of the bed and go to the bathroom on his own without calling for help. No injuries  were noted on evaluation.  PT and OT.  SNF recommended.   DVT Prophylaxis:   enoxaparin (LOVENOX) injection 40 mg Start: 09/19/20 1100 SCDs Start: 09/19/20 2351   Advance goals of care discussion: DNR Family Communication: Son was updated yesterday.  Disposition: SNF recommended by PT and OT.  Patient agreeable.  TOC consulted.  Status is: Inpatient  Remains inpatient appropriate because:IV treatments appropriate due to intensity of illness or inability to take PO and Inpatient level of care appropriate due to severity of illness   Dispo: The patient is from: Home              Anticipated d/c is to: SNF              Anticipated d/c date is: 3 days              Patient currently is not medically stable to d/c.  Subjective: Patient denies any complaints this morning.  Overall he states that he is feeling well.  Still has shortness of breath and cough.  Denies any chest pain.  He is hard of hearing.     Physical Exam:  General appearance: Awake alert.  In no distress.  Distracted Resp: Normal effort at rest.  Few crackles in the bases.  No wheezing or rhonchi.  Cardio: S1-S2 is normal regular.  No S3-S4.  No rubs murmurs or bruit GI:  Abdomen is soft.  Nontender nondistended.  Bowel sounds are present normal.  No masses organomegaly Extremities: No edema.  Able to move all of his extremities Neurologic: No focal neurological deficits.      Vitals:   09/21/20 2359 Oct 22, 2020 0400 10/22/20 0710 10-22-20 1204  BP: (!) 141/61 (!) 138/118 (!) 150/68 (!) 157/79  Pulse: 81 82 82   Resp: 18 20 20 20   Temp: 98.7 F (37.1 C) 98.7 F (37.1 C) 98.7 F (37.1 C) 98.5 F (36.9 C)  TempSrc: Axillary Axillary Axillary Oral  SpO2: 100% 98% 91% 94%  Weight: 61 kg 61 kg    Height:        Intake/Output Summary (Last 24 hours) at 22-Oct-2020 1315 Last data filed at 2020-10-22 0900 Gross per 24 hour  Intake 240 ml  Output 200 ml  Net 40 ml   Filed Weights   09/21/20 0308 09/21/20 2359  Oct 22, 2020 0400  Weight: 60.8 kg 61 kg 61 kg    Data Reviewed:  CBC: Recent Labs  Lab 09/21/2020 2152 09/14/2020 2324 09/18/20 0340 09/19/20 1015 09/20/20 0301 09/21/20 0637 October 22, 2020 0510  WBC 12.3*   < > 8.1 12.6* 9.0 8.9 8.7  NEUTROABS 8.9*  --   --  10.9*  --   --   --   HGB 15.0   < > 13.7 15.3 15.3 13.9 14.2  HCT 47.7   < > 41.0 47.1 47.5 42.4 44.1  MCV 102.4*   < > 100.7* 101.9* 100.8* 101.9* 103.0*  PLT 126*   < > 107* 120* PLATELET CLUMPS NOTED ON SMEAR, UNABLE TO ESTIMATE 120* 117*   < > = values in this interval not displayed.   Basic Metabolic Panel: Recent Labs  Lab 09/18/20 0340 09/19/20 1015 09/20/20 0301 09/21/20 0637 October 22, 2020 0510  NA 141 145 141 146* 146*  K 4.1 3.9 4.5 4.9 5.4*  CL 103 100 99 100 100  CO2 27 34* 29 39* 38*  GLUCOSE 119* 144* 180* 172* 169*  BUN 27* 46* 52* 34* 33*  CREATININE 0.98 1.24 1.13 0.80 0.72  CALCIUM 8.4* 8.5* 8.1* 8.3* 8.3*  MG  --  2.2 2.3 2.1 QUANTITY NOT SUFFICIENT, UNABLE TO PERFORM TEST    Studies: No results found.  Scheduled Meds: . aspirin EC  81 mg Oral Daily  . benzonatate  200 mg Oral BID  . Chlorhexidine Gluconate Cloth  6 each Topical Q1200  . enoxaparin (LOVENOX) injection  40 mg Subcutaneous Q24H  . famotidine  20 mg Oral Daily  . guaiFENesin-dextromethorphan  10 mL Oral QID  . Ipratropium-Albuterol  1 puff Inhalation QID  . methylPREDNISolone (SOLU-MEDROL) injection  30 mg Intravenous BID  . mometasone-formoterol  2 puff Inhalation BID  . senna-docusate  1 tablet Oral BID   Continuous Infusions:  PRN Meds: acetaminophen, chlorpheniramine-HYDROcodone, ondansetron (ZOFRAN) IV, polyethylene glycol    Author: Bonnielee Haff  [facility] 22-Oct-2020 1:15 PM  To reach On-call, see care teams to locate the attending and reach out via [email or web address]. Between 7PM-7AM, please contact night-coverage If you still have difficulty reaching the attending provider, please page the [facility] (Director on Call)  for [facility] on amion for assistance.

## 2020-10-04 NOTE — Evaluation (Signed)
Occupational Therapy Evaluation Patient Details Name: Timothy Vaughn MRN: [id number] DOB: [birth year] Today's Date: 09/26/2020    History of Present Illness Pt adm with acute on chronic hypoxemic and hypercapnic respiratory failure secondary to COVID-19 infection. Pt initially with acute encephalopathy. Pt with in hospital fall early 1/18 with mild injury to lt elbow. PMH - restrictive lung disease due to kyphoscoliosis, polio, HTN,   Clinical Impression   Pt admitted with above. He demonstrates the below listed deficits and will benefit from continued OT to maximize safety and independence with BADLs.  Pt presents to OT with generalized weakness, decreased activity tolerance, impaired balance, and significant cognitive deficits.  He currently requires min - mod A for ADLs and min A for funcitonal mobility.  He lived with his wife for whom he was primary caregiver, and he was fully independent PTA.  Recommend SNF.       Follow Up Recommendations  SNF;Supervision/Assistance - 24 hour    Equipment Recommendations  3 in 1 bedside commode    Recommendations for Other Services       Precautions / Restrictions Precautions Precautions: Fall      Mobility Bed Mobility Overal bed mobility: Needs Assistance Bed Mobility: Supine to Sit;Sit to Supine     Supine to sit: Min assist Sit to supine: Mod assist   General bed mobility comments: pt requires assist for sequencing and problem solving the task. He repeatedly attempted to move his legs off the bed when attempting to lie down    Transfers Overall transfer level: Needs assistance Equipment used: Rolling walker (2 wheeled) Transfers: Sit to/from [facility] Sit to Stand: Min assist Stand pivot transfers: Min assist       General transfer comment: assist to steady    Balance Overall balance assessment: Needs assistance Sitting-balance support: No upper extremity supported;Feet supported Sitting  balance-Leahy Scale: Good     Standing balance support: Bilateral upper extremity supported Standing balance-Leahy Scale: Poor Standing balance comment: requires UE support                           ADL either performed or assessed with clinical judgement   ADL Overall ADL's : Needs assistance/impaired Eating/Feeding: Set up;Supervision/ safety;Sitting;Bed level   Grooming: Wash/dry hands;Wash/dry face;Oral care;Standing;Minimal assistance   Upper Body Bathing: Sitting;Moderate assistance   Lower Body Bathing: Sit to/from stand;Moderate assistance   Upper Body Dressing : Sitting;Moderate assistance   Lower Body Dressing: Sit to/from stand;Moderate assistance   Toilet Transfer: Minimal assistance;Stand-pivot;Ambulation;BSC;RW   Toileting- Clothing Manipulation and Hygiene: Maximal assistance;Sit to/from stand       Functional mobility during ADLs: Minimal assistance;Rolling walker General ADL Comments: Pt requires assist due to balance deficits as well as cognitive deficits     Vision Baseline Vision/History: Wears glasses Wears Glasses: At all times Patient Visual Report: No change from baseline       Perception     Praxis      Pertinent Vitals/Pain Pain Assessment: No/denies pain     Hand Dominance Right   Extremity/Trunk Assessment Upper Extremity Assessment Upper Extremity Assessment: Generalized weakness (UEs tremulous)   Lower Extremity Assessment Lower Extremity Assessment: Generalized weakness   Cervical / Trunk Assessment Cervical / Trunk Assessment: Kyphotic   Communication Communication Communication: HOH   Cognition Arousal/Alertness: Awake/alert Behavior During Therapy: WFL for tasks assessed/performed Overall Cognitive Status: Within Functional Limits for tasks assessed Area of Impairment: Orientation;Attention;Memory;Following commands;Safety/judgement;Awareness;Problem solving  Orientation Level:  Disoriented to;Time;Situation;Place Current Attention Level: Sustained Memory: Decreased short-term memory Following Commands: Follows one step commands consistently Safety/Judgement: Decreased awareness of safety;Decreased awareness of deficits Awareness: Emergent Problem Solving: Slow processing;Decreased initiation;Difficulty sequencing;Requires verbal cues;Requires tactile cues General Comments: Pt knows he has COVID, but doesn't know where he is, or the time.  He is very pleasant.  Follows commands consistently   General Comments  Pt on 3L supplemental 02, with Sp02 decreased to 82% with activity, but with unreliable pleth.  DOE 3/4.  Once pt returned to supine and at rest, Sp02 gradually increased to 94%.    Exercises     Shoulder Instructions      Home Living Family/patient expects to be discharged to:: Skilled nursing facility Living Arrangements: Spouse/significant other;Children Available Help at Discharge: Family;Available PRN/intermittently Type of Home: House Home Access: Ramped entrance     Home Layout: One level               Home Equipment: None   Additional Comments: Uses home O2 at night time only.  Pt was caregiver for his wife who had a CVA      Prior Functioning/Environment Level of Independence: Independent        Comments: Pt reports he drove and was caregiver for his wife        OT Problem List: Decreased strength;Decreased activity tolerance;Impaired balance (sitting and/or standing);Decreased coordination;Decreased cognition;Decreased safety awareness;Decreased knowledge of use of DME or AE;Decreased knowledge of precautions;Cardiopulmonary status limiting activity      OT Treatment/Interventions: Self-care/ADL training;Therapeutic exercise;Energy conservation;DME and/or AE instruction;Therapeutic activities;Cognitive remediation/compensation;Patient/family education;Balance training    OT Goals(Current goals can be found in the care plan  section) Acute Rehab OT Goals Patient Stated Goal: pt did not state OT Goal Formulation: With patient Time For Goal Achievement: 10/06/20 Potential to Achieve Goals: Good ADL Goals Pt Will Perform Grooming: (P) with min guard assist;standing Pt Will Perform Upper Body Bathing: (P) with set-up;with supervision;sitting Pt Will Perform Lower Body Bathing: (P) with min guard assist;sit to/from stand Pt Will Perform Upper Body Dressing: (P) with set-up;with supervision;sitting Pt Will Perform Lower Body Dressing: (P) with min guard assist;sit to/from stand Pt Will Transfer to Toilet: (P) with min guard assist;ambulating;regular height toilet;bedside commode;grab bars Pt Will Perform Toileting - Clothing Manipulation and hygiene: (P) with min guard assist;sit to/from stand Additional ADL Goal #1: (P) Pt will be able to actively participate in therapeutic activity x 25 mins with no more than 2 rest breaks and VSS Additional ADL Goal #2: (P) Pt will be able to selectively attend to self care tasks with no cues  OT Frequency: Min 2X/week   Barriers to D/C: Decreased caregiver support          Co-evaluation              AM-PAC OT "6 Clicks" Daily Activity     Outcome Measure Help from another person eating meals?: A Little Help from another person taking care of personal grooming?: A Little Help from another person toileting, which includes using toliet, bedpan, or urinal?: A Lot Help from another person bathing (including washing, rinsing, drying)?: A Lot Help from another person to put on and taking off regular upper body clothing?: A Lot Help from another person to put on and taking off regular lower body clothing?: A Lot 6 Click Score: 14   End of Session Equipment Utilized During Treatment: Oxygen Nurse Communication: Mobility status  Activity Tolerance: Patient tolerated treatment well Patient  left: in bed;with call bell/phone within reach;with bed alarm set  OT Visit  Diagnosis: Unsteadiness on feet (R26.81);Cognitive communication deficit (R41.841)                Time: [id number] OT Time Calculation (min): 25 min Charges:  OT General Charges $OT Visit: 1 Visit OT Evaluation $OT Eval Moderate Complexity: 1 Mod OT Treatments $Self Care/Home Management : 8-22 mins  Nilsa Nutting OTR/L Acute Rehabilitation Services Pager [phone] Office Robertt, Kimberly 10/01/20, 3:57 PM

## 2020-10-04 NOTE — Significant Event (Addendum)
Rapid Response Event Note   Reason for Call :  Pt choking.  Per bedside RN, pt was given a sip of water before giving him his meds and pt tolerated well. The RN then gave him his meds and the pt tolerated this well. The RN then gave him another sip of water and he began to cough. His SpO2 then began to drop. Deep oral suctioning was then performed along with back blows. Pt continued to decline so RRT was called. Pt is a DNR.  Initial Focused Assessment:  On my arrival, pt was on 15L HFNC and NRB, unresponsive, apneic and pulseless. His HOB was in the high fowler position. He is a DNR.  His telemetry box showed an idioventricular rhythm. Deep oral suctioning performed as well as nasotracheal suctioning. These interventions did not change pt assessment.   Pt pronounced at 12/28/37 after confirming no pulse, no breath sounds and no heartbeat x 1 minute. Confirmed with Elmyra Ricks, RN.   Interventions:  Deep oral suctioning NT suctioning   Event Summary:   MD Notified: Dr. Clearence Ped notified by bedside RN Call Owen, Frieda Arnall Anderson, RN

## 2020-10-04 DEATH — deceased

## 2020-10-11 ENCOUNTER — Encounter: Payer: Medicare Other | Admitting: Internal Medicine

## 2021-05-02 IMAGING — DX DG ELBOW 2V*L*
2 series · 2 of 2 positions shown · non-contrast
Comparison: None.

CLINICAL DATA: Fall last night with abrasion to the posterior
aspect of the left elbow. COVID positive.

EXAM:
LEFT ELBOW - 2 VIEW

[elbow ap]
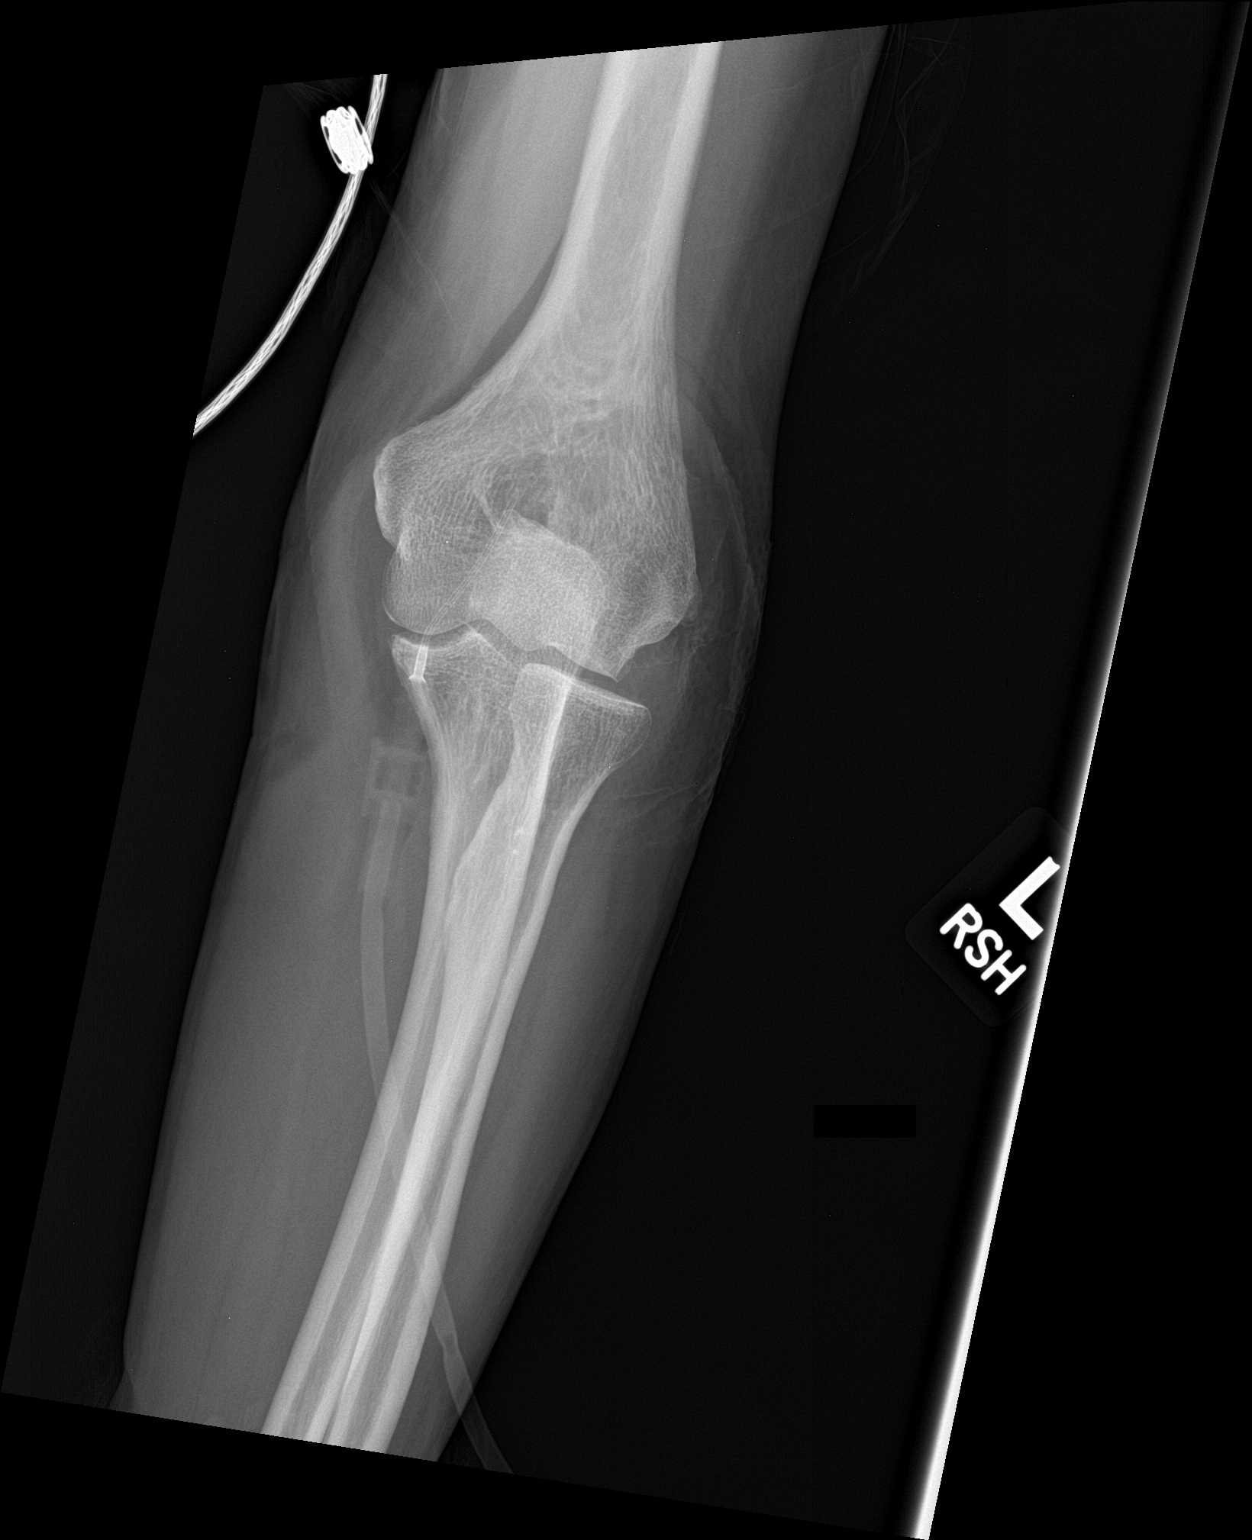

[elbow lat]
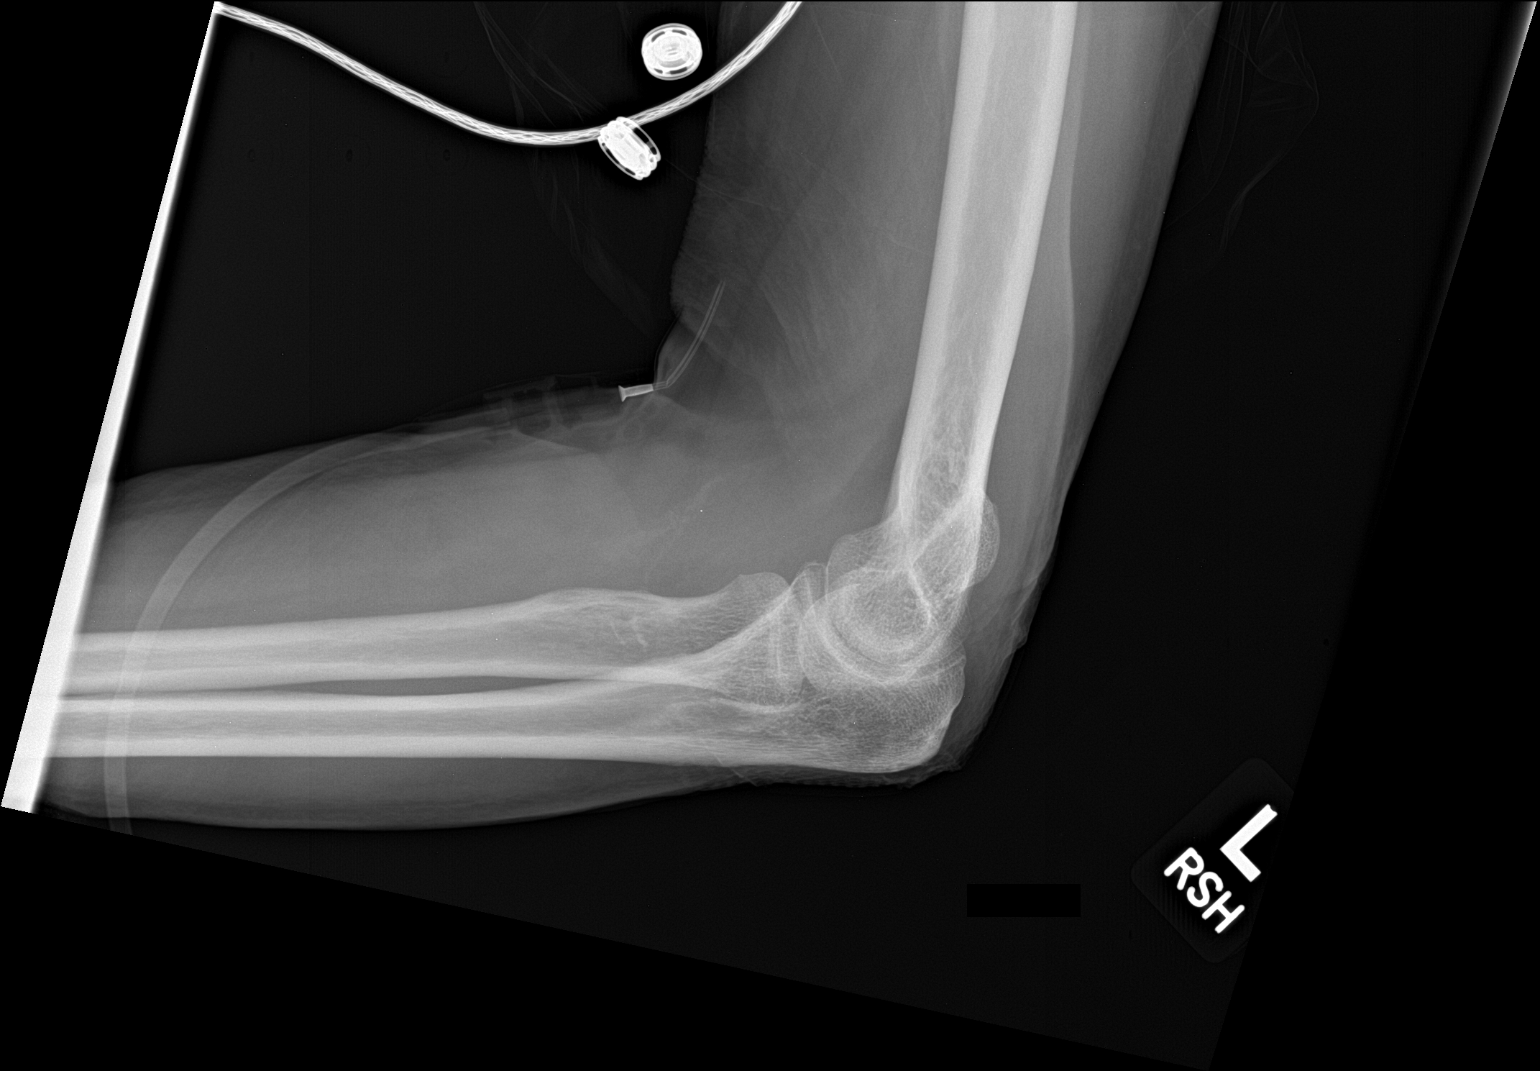

[2 of 2 positions shown; findings below may reference images not displayed]

FINDINGS: There is an apparent soft tissue laceration about the posterior
aspect of the distal humerus without associated fracture or elbow
joint effusion. Joint spaces appear preserved. A peripheral IV is
seen within the antecubital fossa. No radiopaque foreign body.
IMPRESSION: Soft tissue laceration about the posterior aspect of the distal
humerus without associated fracture or elbow joint effusion.

## 2021-07-06 ENCOUNTER — Ambulatory Visit: Payer: Medicare Other | Admitting: Adult Health Nurse Practitioner
